# Patient Record
Sex: Male | Born: 1955 | Race: White | Hispanic: No | Marital: Married | Smoking: Former smoker
Health system: Southern US, Community
[De-identification: ages and names within clinical notes are randomized; demographics above are authoritative.]

## PROBLEM LIST (undated history)

## (undated) ENCOUNTER — Inpatient Hospital Stay: Admission: EM | Payer: Self-pay | Source: Home / Self Care

## (undated) DIAGNOSIS — M545 Low back pain, unspecified: Secondary | ICD-10-CM

## (undated) DIAGNOSIS — K7460 Unspecified cirrhosis of liver: Secondary | ICD-10-CM

## (undated) DIAGNOSIS — B182 Chronic viral hepatitis C: Secondary | ICD-10-CM

## (undated) DIAGNOSIS — Z96651 Presence of right artificial knee joint: Secondary | ICD-10-CM

## (undated) DIAGNOSIS — B1920 Unspecified viral hepatitis C without hepatic coma: Secondary | ICD-10-CM

## (undated) DIAGNOSIS — G8929 Other chronic pain: Secondary | ICD-10-CM

## (undated) HISTORY — PX: LAMINECTOMY: SHX219

## (undated) HISTORY — PX: BACK SURGERY: SHX140

## (undated) HISTORY — PX: KNEE ARTHROPLASTY: SHX992

---

## 2012-11-07 ENCOUNTER — Other Ambulatory Visit (HOSPITAL_COMMUNITY): Payer: Self-pay | Admitting: *Deleted

## 2012-11-07 DIAGNOSIS — R188 Other ascites: Secondary | ICD-10-CM

## 2012-11-09 ENCOUNTER — Ambulatory Visit (HOSPITAL_COMMUNITY)
Admission: RE | Admit: 2012-11-09 | Discharge: 2012-11-09 | Disposition: A | Discharge status: Home / Self Care | Payer: Managed Care, Other (non HMO) | Source: Ambulatory Visit | Attending: Orthodontics | Admitting: Orthodontics

## 2012-11-09 VITALS — BP 106/73

## 2012-11-09 DIAGNOSIS — R188 Other ascites: Secondary | ICD-10-CM

## 2012-11-09 MED ORDER — ALBUMIN HUMAN 25 % IV SOLN
25.0000 g | Freq: Once | INTRAVENOUS | Status: AC
  Administered 2012-11-09: 25 g via INTRAVENOUS
  Filled 2012-11-09: qty 100

## 2012-11-09 NOTE — Procedures (Signed)
Procedure : paracentesis up to 7 L limit Specimen : 7 L yellow serous fluid Complications : none immediate Pt sent for albumin infusion post procedure as requested

## 2012-11-21 ENCOUNTER — Other Ambulatory Visit (HOSPITAL_COMMUNITY): Payer: Self-pay | Admitting: Orthodontics

## 2012-11-21 DIAGNOSIS — R188 Other ascites: Secondary | ICD-10-CM

## 2012-11-22 ENCOUNTER — Ambulatory Visit (HOSPITAL_COMMUNITY): Admission: RE | Admit: 2012-11-22 | Payer: Managed Care, Other (non HMO) | Source: Ambulatory Visit

## 2012-11-24 ENCOUNTER — Ambulatory Visit (HOSPITAL_COMMUNITY)
Admission: RE | Admit: 2012-11-24 | Discharge: 2012-11-24 | Disposition: A | Discharge status: Home / Self Care | Payer: Managed Care, Other (non HMO) | Source: Ambulatory Visit | Attending: Orthodontics | Admitting: Orthodontics

## 2012-11-24 VITALS — BP 156/69 | HR 73 | Temp 97.9°F | Resp 20 | Ht 75.0 in | Wt 180.0 lb

## 2012-11-24 DIAGNOSIS — R188 Other ascites: Secondary | ICD-10-CM

## 2012-11-24 MED ORDER — ALBUMIN HUMAN 25 % IV SOLN
25.0000 g | Freq: Once | INTRAVENOUS | Status: AC
  Administered 2012-11-24: 25 g via INTRAVENOUS
  Filled 2012-11-24: qty 100

## 2012-11-24 NOTE — Procedures (Signed)
US guided RLQ para  6.5 Liters yellow fluid 25 gr IV albumin per MD  Pt tolerated well

## 2012-12-01 ENCOUNTER — Other Ambulatory Visit (HOSPITAL_COMMUNITY): Payer: Self-pay | Admitting: Orthodontics

## 2012-12-01 DIAGNOSIS — R188 Other ascites: Secondary | ICD-10-CM

## 2012-12-01 DIAGNOSIS — K7460 Unspecified cirrhosis of liver: Secondary | ICD-10-CM

## 2012-12-05 ENCOUNTER — Ambulatory Visit (HOSPITAL_COMMUNITY)
Admission: RE | Admit: 2012-12-05 | Discharge: 2012-12-05 | Disposition: A | Discharge status: Home / Self Care | Payer: Managed Care, Other (non HMO) | Source: Ambulatory Visit | Attending: Orthodontics | Admitting: Orthodontics

## 2012-12-05 DIAGNOSIS — K7460 Unspecified cirrhosis of liver: Secondary | ICD-10-CM

## 2012-12-05 DIAGNOSIS — R188 Other ascites: Secondary | ICD-10-CM | POA: Insufficient documentation

## 2012-12-05 MED ORDER — ALBUMIN HUMAN 25 % IV SOLN
25.0000 g | Freq: Once | INTRAVENOUS | Status: AC
  Administered 2012-12-05: 25 g via INTRAVENOUS
  Filled 2012-12-05: qty 100

## 2012-12-05 NOTE — Procedures (Signed)
Successful US guided paracentesis from LLQ.  Yielded 6.4L of clear yellow fluid.  No immediate complications.  Pt tolerated well.   Specimen was not sent for labs. Pt was sent to short stay for 25g of IV albumin as ordered.  Brayton El PA-C 12/05/2012 11:27 AM

## 2012-12-15 ENCOUNTER — Ambulatory Visit (HOSPITAL_COMMUNITY)
Admission: RE | Admit: 2012-12-15 | Discharge: 2012-12-15 | Disposition: A | Discharge status: Home / Self Care | Payer: Managed Care, Other (non HMO) | Source: Ambulatory Visit | Attending: Orthodontics | Admitting: Orthodontics

## 2012-12-15 ENCOUNTER — Other Ambulatory Visit (HOSPITAL_COMMUNITY): Payer: Self-pay | Admitting: Orthodontics

## 2012-12-15 DIAGNOSIS — R188 Other ascites: Secondary | ICD-10-CM | POA: Insufficient documentation

## 2012-12-15 MED ORDER — ALBUMIN HUMAN 25 % IV SOLN
25.0000 g | Freq: Once | INTRAVENOUS | Status: AC
  Administered 2012-12-15: 25 g via INTRAVENOUS
  Filled 2012-12-15: qty 100

## 2012-12-15 NOTE — Procedures (Signed)
Successful US guided paracentesis from RLQ.  Yielded 7L of clear yellow fluid.  No immediate complications.  Pt tolerated well.   Specimen was not sent for labs. The patient was sent to Short Stay center for IV albumin as ordered.  Brayton El PA-C 12/15/2012 2:39 PM

## 2012-12-21 ENCOUNTER — Other Ambulatory Visit (HOSPITAL_COMMUNITY): Payer: Self-pay | Admitting: Orthodontics

## 2012-12-21 DIAGNOSIS — R188 Other ascites: Secondary | ICD-10-CM

## 2012-12-22 ENCOUNTER — Ambulatory Visit (HOSPITAL_COMMUNITY)
Admission: RE | Admit: 2012-12-22 | Discharge: 2012-12-22 | Disposition: A | Discharge status: Home / Self Care | Payer: Managed Care, Other (non HMO) | Source: Ambulatory Visit | Attending: Orthodontics | Admitting: Orthodontics

## 2012-12-22 DIAGNOSIS — R188 Other ascites: Secondary | ICD-10-CM | POA: Insufficient documentation

## 2012-12-22 MED ORDER — ALBUMIN HUMAN 25 % IV SOLN
25.0000 g | Freq: Once | INTRAVENOUS | Status: AC
  Administered 2012-12-22: 25 g via INTRAVENOUS

## 2012-12-22 MED ORDER — ALBUMIN HUMAN 25 % IV SOLN: 25.0000 g | Freq: Once | INTRAVENOUS | Status: DC

## 2012-12-22 MED ORDER — ALBUMIN HUMAN 25 % IV SOLN: 40.0000 g | Freq: Once | INTRAVENOUS | Status: DC

## 2012-12-22 NOTE — Procedures (Addendum)
US guided RLQ para 5.4L yellow  25 gr IV albumin per MD

## 2013-01-11 ENCOUNTER — Other Ambulatory Visit (HOSPITAL_COMMUNITY): Payer: Self-pay | Admitting: *Deleted

## 2013-01-11 DIAGNOSIS — R188 Other ascites: Secondary | ICD-10-CM

## 2013-01-11 DIAGNOSIS — K7460 Unspecified cirrhosis of liver: Secondary | ICD-10-CM

## 2013-01-11 NOTE — Addendum Note (Signed)
Encounter addended by: Gatha Mayer on: 01/11/2013  2:28 PM<BR>     Documentation filed: Orders

## 2013-01-11 NOTE — Addendum Note (Signed)
Encounter addended by: Gatha Mayer on: 01/11/2013  3:13 PM<BR>     Documentation filed: Orders

## 2013-01-12 ENCOUNTER — Ambulatory Visit (HOSPITAL_COMMUNITY)
Admission: RE | Admit: 2013-01-12 | Discharge: 2013-01-12 | Disposition: A | Discharge status: Home / Self Care | Payer: Managed Care, Other (non HMO) | Source: Ambulatory Visit | Attending: Family Medicine | Admitting: Family Medicine

## 2013-01-12 VITALS — BP 102/66 | HR 54 | Temp 97.6°F | Resp 18 | Ht 75.0 in | Wt 180.0 lb

## 2013-01-12 DIAGNOSIS — K7460 Unspecified cirrhosis of liver: Secondary | ICD-10-CM | POA: Insufficient documentation

## 2013-01-12 DIAGNOSIS — R188 Other ascites: Secondary | ICD-10-CM

## 2013-01-12 MED ORDER — ALBUMIN HUMAN 25 % IV SOLN
25.0000 g | Freq: Once | INTRAVENOUS | Status: AC
  Administered 2013-01-12: 25 g via INTRAVENOUS
  Filled 2013-01-12: qty 100

## 2013-01-12 NOTE — Procedures (Signed)
US guided therapeutic paracentesis performed yielding 7 liters yellow fluid. No immediate complications. The pt will receive IV albumin postprocedure. 

## 2013-01-23 ENCOUNTER — Inpatient Hospital Stay (HOSPITAL_COMMUNITY)
Admission: EM | Admit: 2013-01-23 | Discharge: 2013-01-30 | DRG: 372 | Disposition: A | Discharge status: Home / Self Care | Payer: Managed Care, Other (non HMO) | Attending: Internal Medicine | Admitting: Internal Medicine

## 2013-01-23 ENCOUNTER — Encounter (HOSPITAL_COMMUNITY): Payer: Self-pay | Admitting: Emergency Medicine

## 2013-01-23 DIAGNOSIS — R5383 Other fatigue: Secondary | ICD-10-CM

## 2013-01-23 DIAGNOSIS — I9589 Other hypotension: Secondary | ICD-10-CM | POA: Diagnosis not present

## 2013-01-23 DIAGNOSIS — Y9210 Unspecified residential institution as the place of occurrence of the external cause: Secondary | ICD-10-CM | POA: Diagnosis not present

## 2013-01-23 DIAGNOSIS — E871 Hypo-osmolality and hyponatremia: Secondary | ICD-10-CM | POA: Diagnosis present

## 2013-01-23 DIAGNOSIS — I959 Hypotension, unspecified: Secondary | ICD-10-CM

## 2013-01-23 DIAGNOSIS — Z7982 Long term (current) use of aspirin: Secondary | ICD-10-CM

## 2013-01-23 DIAGNOSIS — K429 Umbilical hernia without obstruction or gangrene: Secondary | ICD-10-CM | POA: Diagnosis present

## 2013-01-23 DIAGNOSIS — K652 Spontaneous bacterial peritonitis: Secondary | ICD-10-CM

## 2013-01-23 DIAGNOSIS — D638 Anemia in other chronic diseases classified elsewhere: Secondary | ICD-10-CM

## 2013-01-23 DIAGNOSIS — B1920 Unspecified viral hepatitis C without hepatic coma: Secondary | ICD-10-CM | POA: Diagnosis present

## 2013-01-23 DIAGNOSIS — R7989 Other specified abnormal findings of blood chemistry: Secondary | ICD-10-CM | POA: Diagnosis present

## 2013-01-23 DIAGNOSIS — Z87891 Personal history of nicotine dependence: Secondary | ICD-10-CM

## 2013-01-23 DIAGNOSIS — Z7682 Awaiting organ transplant status: Secondary | ICD-10-CM

## 2013-01-23 DIAGNOSIS — R109 Unspecified abdominal pain: Secondary | ICD-10-CM

## 2013-01-23 DIAGNOSIS — D72829 Elevated white blood cell count, unspecified: Secondary | ICD-10-CM | POA: Diagnosis present

## 2013-01-23 DIAGNOSIS — Y849 Medical procedure, unspecified as the cause of abnormal reaction of the patient, or of later complication, without mention of misadventure at the time of the procedure: Secondary | ICD-10-CM | POA: Diagnosis not present

## 2013-01-23 DIAGNOSIS — Z79899 Other long term (current) drug therapy: Secondary | ICD-10-CM

## 2013-01-23 DIAGNOSIS — K7460 Unspecified cirrhosis of liver: Secondary | ICD-10-CM | POA: Diagnosis present

## 2013-01-23 DIAGNOSIS — Z96659 Presence of unspecified artificial knee joint: Secondary | ICD-10-CM

## 2013-01-23 DIAGNOSIS — R188 Other ascites: Secondary | ICD-10-CM

## 2013-01-23 DIAGNOSIS — D6959 Other secondary thrombocytopenia: Secondary | ICD-10-CM

## 2013-01-23 DIAGNOSIS — R531 Weakness: Secondary | ICD-10-CM | POA: Insufficient documentation

## 2013-01-23 DIAGNOSIS — B182 Chronic viral hepatitis C: Secondary | ICD-10-CM

## 2013-01-23 DIAGNOSIS — E875 Hyperkalemia: Secondary | ICD-10-CM | POA: Diagnosis present

## 2013-01-23 HISTORY — DX: Chronic viral hepatitis C: B18.2

## 2013-01-23 HISTORY — DX: Unspecified cirrhosis of liver: K74.60

## 2013-01-23 HISTORY — DX: Presence of right artificial knee joint: Z96.651

## 2013-01-23 HISTORY — DX: Unspecified viral hepatitis C without hepatic coma: B19.20

## 2013-01-23 LAB — BASIC METABOLIC PANEL
BUN: 43 mg/dL — ABNORMAL HIGH (ref 6–23)
CO2: 23 mEq/L (ref 19–32)
Chloride: 100 mEq/L (ref 96–112)
Glucose, Bld: 106 mg/dL — ABNORMAL HIGH (ref 70–99)
Potassium: 4.9 mEq/L (ref 3.5–5.1)
Sodium: 132 mEq/L — ABNORMAL LOW (ref 135–145)

## 2013-01-23 LAB — HEPATIC FUNCTION PANEL
Albumin: 2.8 g/dL — ABNORMAL LOW (ref 3.5–5.2)
Alkaline Phosphatase: 47 U/L (ref 39–117)
Indirect Bilirubin: 0.3 mg/dL (ref 0.3–0.9)
Total Bilirubin: 0.4 mg/dL (ref 0.3–1.2)
Total Protein: 6.8 g/dL (ref 6.0–8.3)

## 2013-01-23 LAB — TROPONIN I: Troponin I: <0.3 ng/mL (ref <0.30)

## 2013-01-23 LAB — URINALYSIS, ROUTINE W REFLEX MICROSCOPIC
Nitrite: NEGATIVE
Protein, ur: NEGATIVE mg/dL
Specific Gravity, Urine: 1.028 (ref 1.005–1.030)
Urobilinogen, UA: 1 mg/dL (ref 0.0–1.0)

## 2013-01-23 LAB — PROTIME-INR
INR: 1.31 (ref 0.00–1.49)
Prothrombin Time: 16 seconds — ABNORMAL HIGH (ref 11.6–15.2)

## 2013-01-23 LAB — CBC WITH DIFFERENTIAL/PLATELET
Eosinophils Absolute: 0.2 10*3/uL (ref 0.0–0.7)
Hemoglobin: 11.4 g/dL — ABNORMAL LOW (ref 13.0–17.0)
Lymphocytes Relative: 15 % (ref 12–46)
Lymphs Abs: 1.9 10*3/uL (ref 0.7–4.0)
MCH: 28.9 pg (ref 26.0–34.0)
Monocytes Relative: 7 % (ref 3–12)
Neutro Abs: 10.2 10*3/uL — ABNORMAL HIGH (ref 1.7–7.7)
Neutrophils Relative %: 77 % (ref 43–77)
RBC: 3.94 MIL/uL — ABNORMAL LOW (ref 4.22–5.81)
WBC: 13.3 10*3/uL — ABNORMAL HIGH (ref 4.0–10.5)

## 2013-01-23 LAB — AMMONIA: Ammonia: 64 umol/L — ABNORMAL HIGH (ref 11–60)

## 2013-01-23 LAB — LACTIC ACID, PLASMA: Lactic Acid, Venous: 2.1 mmol/L (ref 0.5–2.2)

## 2013-01-23 MED ORDER — SODIUM CHLORIDE 0.9 % IV BOLUS (SEPSIS)
1000.0000 mL | Freq: Once | INTRAVENOUS | Status: AC
  Administered 2013-01-23: 1000 mL via INTRAVENOUS

## 2013-01-23 MED ORDER — DEXTROSE 5 % IV SOLN
1.0000 g | Freq: Once | INTRAVENOUS | Status: AC
  Administered 2013-01-23: 1 g via INTRAVENOUS
  Filled 2013-01-23: qty 10

## 2013-01-23 NOTE — ED Provider Notes (Signed)
History     CSN: [id number]  Arrival date & time 01/23/13  1916   None     Chief Complaint  Patient presents with  . Abdominal Pain    (Consider location/radiation/quality/duration/timing/severity/associated sxs/prior treatment) HPI Comments: Patient brought to the ER for evaluation of generalized weakness. Patient is on the transplant list because of hepatitis C. Patient has had progressively worsening weakness over the period of one week. His abdomen has become more distended and he is expressing abdominal discomfort. He feels like he is short of breath because of his increased abdominal distention. He has had multiple paracentesis in the past, is scheduled for another on Friday. He has not had any fever.  Patient is a 57 y.o. male presenting with abdominal pain.  Abdominal Pain Associated symptoms: fatigue and shortness of breath   Associated symptoms: no fever     Past Medical History  Diagnosis Date  . Hepatitis C   . Cirrhosis of liver due to hepatitis C   . Presence of right artificial knee joint     Past Surgical History  Procedure Laterality Date  . Knee arthroplasty      History reviewed. No pertinent family history.  History  Substance Use Topics  . Smoking status: Former Games developer  . Smokeless tobacco: Not on file  . Alcohol Use: No      Review of Systems  Constitutional: Positive for fatigue. Negative for fever.  Respiratory: Positive for shortness of breath.   Gastrointestinal: Positive for abdominal pain and abdominal distention.  Neurological: Positive for weakness.  All other systems reviewed and are negative.    Allergies  Review of patient's allergies indicates no known allergies.  Home Medications  No current outpatient prescriptions on file.  BP 100/74  Pulse 51  Temp(Src) 97.5 F (36.4 C) (Oral)  Resp 12  SpO2 100%  Physical Exam  Constitutional: He is oriented to person, place, and time. He appears listless. He appears  distressed.  HENT:  Head: Normocephalic and atraumatic.  Eyes: Pupils are equal, round, and reactive to light. Scleral icterus is present.  Neck: Normal range of motion. Neck supple.  Cardiovascular: Normal rate, regular rhythm and normal heart sounds.   Pulmonary/Chest: Effort normal and breath sounds normal. No respiratory distress. He has no wheezes. He has no rales.  Abdominal: He exhibits distension. He exhibits no mass. There is tenderness. There is no rebound and no guarding.  Musculoskeletal: Normal range of motion. He exhibits no edema.  Neurological: He is oriented to person, place, and time. He appears listless. No cranial nerve deficit or sensory deficit. GCS eye subscore is 4. GCS verbal subscore is 5. GCS motor subscore is 6.    ED Course  Procedures (including critical care time)   Date: 01/23/2013  Rate: 49  Rhythm: sinus tachycardia and sinus bradycardia  QRS Axis: normal  Intervals: normal  ST/T Wave abnormalities: normal  Conduction Disutrbances:none  Narrative Interpretation:   Old EKG Reviewed: none available    Labs Reviewed  CBC WITH DIFFERENTIAL - Abnormal; Notable for the following:    WBC 13.3 (*)    RBC 3.94 (*)    Hemoglobin 11.4 (*)    HCT 33.5 (*)    Neutro Abs 10.2 (*)    All other components within normal limits  BASIC METABOLIC PANEL - Abnormal; Notable for the following:    Sodium 132 (*)    Glucose, Bld 106 (*)    BUN 43 (*)    GFR calc non  Af Amer 72 (*)    GFR calc Af Amer 83 (*)    All other components within normal limits  PROTIME-INR - Abnormal; Notable for the following:    Prothrombin Time 16.0 (*)    All other components within normal limits  URINALYSIS, ROUTINE W REFLEX MICROSCOPIC - Abnormal; Notable for the following:    Color, Urine AMBER (*)    Ketones, ur 15 (*)    All other components within normal limits  HEPATIC FUNCTION PANEL - Abnormal; Notable for the following:    Albumin 2.8 (*)    All other components within  normal limits  AMMONIA - Abnormal; Notable for the following:    Ammonia 64 (*)    All other components within normal limits  CULTURE, BLOOD (ROUTINE X 2)  CULTURE, BLOOD (ROUTINE X 2)  PRO B NATRIURETIC PEPTIDE  TROPONIN I  LACTIC ACID, PLASMA  TYPE AND SCREEN  ABO/RH   No results found.   Diagnosis: Hypotension; cirrhosis; hep C; possible SBP    MDM  Patient brought to the ER for evaluation of abdominal pain and distention. Patient has history of hepatitis C resulting in liver cirrhosis. He has required recurrent paracentesis. Patient is experiencing progressively worsening abdominal pain over the last week. He is scheduled for paracentesis in 2 days. He has not had a fever. Abdominal exam revealed diffuse tenderness but no peritonitis.  At arrival patient was found to be in distress secondary to hypotension. He was felt like he was going to pass out and it was felt that this was secondary to his low blood pressure. Patient was given a fluid bolus with some improvement, but pressure has dropped once again not a fluid bolus has been stopped.  He does have abdominal pain. Although there is no sign of peritonitis by exam, cannot rule out early spontaneous bacterial peritonitis. Patient treated with Rocephin will be admitted to the hospitalist.        Gilda Crease, MD 01/23/13 [phone]

## 2013-01-23 NOTE — ED Notes (Signed)
Pt c/o of dizziness upon changing positions. Also states abd pain is 6/10.

## 2013-01-23 NOTE — ED Notes (Signed)
Per wife pt has had abd pain and a distended abd. Pt has not been eating, has had low energy. Pt is on the liver transplant list. Pt has been having sx x 1 week.

## 2013-01-24 ENCOUNTER — Encounter (HOSPITAL_COMMUNITY): Payer: Self-pay | Admitting: *Deleted

## 2013-01-24 ENCOUNTER — Inpatient Hospital Stay (HOSPITAL_COMMUNITY): Payer: Managed Care, Other (non HMO)

## 2013-01-24 DIAGNOSIS — R188 Other ascites: Secondary | ICD-10-CM | POA: Diagnosis present

## 2013-01-24 DIAGNOSIS — I9589 Other hypotension: Secondary | ICD-10-CM | POA: Diagnosis present

## 2013-01-24 DIAGNOSIS — D6959 Other secondary thrombocytopenia: Secondary | ICD-10-CM | POA: Diagnosis present

## 2013-01-24 DIAGNOSIS — E875 Hyperkalemia: Secondary | ICD-10-CM | POA: Diagnosis present

## 2013-01-24 LAB — GLUCOSE, CAPILLARY
Glucose-Capillary: 143 mg/dL — ABNORMAL HIGH (ref 70–99)
Glucose-Capillary: 117 mg/dL — ABNORMAL HIGH (ref 70–99)
Glucose-Capillary: 149 mg/dL — ABNORMAL HIGH (ref 70–99)

## 2013-01-24 LAB — COMPREHENSIVE METABOLIC PANEL
ALT: 27 U/L (ref 0–53)
Alkaline Phosphatase: 39 U/L (ref 39–117)
BUN: 57 mg/dL — ABNORMAL HIGH (ref 6–23)
CO2: 22 mEq/L (ref 19–32)
Chloride: 101 mEq/L (ref 96–112)
GFR calc Af Amer: >90 mL/min (ref >90)
GFR calc non Af Amer: 80 mL/min — ABNORMAL LOW (ref >90)
Glucose, Bld: 135 mg/dL — ABNORMAL HIGH (ref 70–99)
Potassium: 5.4 mEq/L — ABNORMAL HIGH (ref 3.5–5.1)
Sodium: 131 mEq/L — ABNORMAL LOW (ref 135–145)
Total Bilirubin: 0.5 mg/dL (ref 0.3–1.2)

## 2013-01-24 LAB — CBC WITH DIFFERENTIAL/PLATELET
Basophils Absolute: 0 10*3/uL (ref 0.0–0.1)
Basophils Relative: 0 % (ref 0–1)
Lymphocytes Relative: 9 % — ABNORMAL LOW (ref 12–46)
MCHC: 35.5 g/dL (ref 30.0–36.0)
Monocytes Absolute: 0.7 10*3/uL (ref 0.1–1.0)
Neutro Abs: 13.8 10*3/uL — ABNORMAL HIGH (ref 1.7–7.7)
Platelets: 121 10*3/uL — ABNORMAL LOW (ref 150–400)
RDW: 15.4 % (ref 11.5–15.5)
WBC: 16 10*3/uL — ABNORMAL HIGH (ref 4.0–10.5)

## 2013-01-24 LAB — LIPASE, BLOOD: Lipase: 35 U/L (ref 11–59)

## 2013-01-24 LAB — LACTIC ACID, PLASMA: Lactic Acid, Venous: 1.9 mmol/L (ref 0.5–2.2)

## 2013-01-24 MED ORDER — RIFAXIMIN 550 MG PO TABS
550.0000 mg | ORAL_TABLET | Freq: Every day | ORAL | Status: DC
  Administered 2013-01-24 – 2013-01-30 (×7): 550 mg via ORAL
  Filled 2013-01-24 (×7): qty 1

## 2013-01-24 MED ORDER — SODIUM CHLORIDE 0.9 % IJ SOLN
3.0000 mL | Freq: Two times a day (BID) | INTRAMUSCULAR | Status: DC
  Administered 2013-01-26 – 2013-01-28 (×5): 3 mL via INTRAVENOUS
  Administered 2013-01-29: 09:00:00 via INTRAVENOUS

## 2013-01-24 MED ORDER — ACETAMINOPHEN 325 MG PO TABS: 650.0000 mg | ORAL_TABLET | Freq: Four times a day (QID) | ORAL | Status: DC | PRN

## 2013-01-24 MED ORDER — ACETAMINOPHEN 650 MG RE SUPP: 650.0000 mg | Freq: Four times a day (QID) | RECTAL | Status: DC | PRN

## 2013-01-24 MED ORDER — HYDROMORPHONE HCL PF 1 MG/ML IJ SOLN
0.5000 mg | INTRAMUSCULAR | Status: DC | PRN
  Administered 2013-01-24: 13:00:00 via INTRAVENOUS
  Administered 2013-01-24: 0.5 mg via INTRAVENOUS
  Administered 2013-01-24: 08:00:00 via INTRAVENOUS
  Administered 2013-01-24: 0.5 mg via INTRAVENOUS
  Administered 2013-01-24: 17:00:00 via INTRAVENOUS
  Administered 2013-01-25: 0.5 mg via INTRAVENOUS
  Administered 2013-01-25: 10:00:00 via INTRAVENOUS
  Administered 2013-01-25: 0.5 mg via INTRAVENOUS
  Administered 2013-01-25: 14:00:00 via INTRAVENOUS
  Administered 2013-01-25: 0.5 mg via INTRAVENOUS
  Administered 2013-01-25: 19:00:00 via INTRAVENOUS
  Administered 2013-01-26 – 2013-01-27 (×8): 0.5 mg via INTRAVENOUS
  Filled 2013-01-24 (×20): qty 1

## 2013-01-24 MED ORDER — ONDANSETRON HCL 4 MG/2ML IJ SOLN
4.0000 mg | Freq: Four times a day (QID) | INTRAMUSCULAR | Status: DC | PRN
  Administered 2013-01-25 – 2013-01-28 (×7): 4 mg via INTRAVENOUS
  Filled 2013-01-24 (×7): qty 2

## 2013-01-24 MED ORDER — SODIUM CHLORIDE 0.9 % IJ SOLN
3.0000 mL | Freq: Two times a day (BID) | INTRAMUSCULAR | Status: DC
  Administered 2013-01-24: 12:00:00 via INTRAVENOUS
  Administered 2013-01-24 – 2013-01-29 (×8): 3 mL via INTRAVENOUS

## 2013-01-24 MED ORDER — ALBUTEROL SULFATE HFA 108 (90 BASE) MCG/ACT IN AERS: 2.0000 | INHALATION_SPRAY | RESPIRATORY_TRACT | Status: DC | PRN

## 2013-01-24 MED ORDER — SERTRALINE HCL 50 MG PO TABS
50.0000 mg | ORAL_TABLET | Freq: Every day | ORAL | Status: DC
  Administered 2013-01-24 – 2013-01-30 (×7): 50 mg via ORAL
  Filled 2013-01-24 (×7): qty 1

## 2013-01-24 MED ORDER — ONDANSETRON HCL 4 MG PO TABS: 4.0000 mg | ORAL_TABLET | Freq: Four times a day (QID) | ORAL | Status: DC | PRN

## 2013-01-24 MED ORDER — IOHEXOL 300 MG/ML  SOLN
20.0000 mL | INTRAMUSCULAR | Status: AC
  Administered 2013-01-24: via ORAL

## 2013-01-24 MED ORDER — ALBUTEROL SULFATE HFA 108 (90 BASE) MCG/ACT IN AERS: 2.0000 | INHALATION_SPRAY | Freq: Four times a day (QID) | RESPIRATORY_TRACT | Status: DC | PRN

## 2013-01-24 MED ORDER — DEXTROSE 5 % IV SOLN
1.0000 g | INTRAVENOUS | Status: AC
  Administered 2013-01-24 – 2013-01-29 (×6): 1 g via INTRAVENOUS
  Filled 2013-01-24 (×6): qty 10

## 2013-01-24 NOTE — Progress Notes (Signed)
[facility] Progress Note [place] TEAM 1 - Stepdown/ICU TEAM   Adrian Compton FAO:[id number] DOB: 1956-06-24 DOA: 01/23/2013 PCP: Delorse Lek, MD  Brief narrative: 57 year old male with known hepatitis C related cirrhosis. On transplant list in [place]. Presented to ER with abdominal pain and weakness. No diarrhea, fever, chills but endorsing arthralgias in both knees and ankles. States was recently treated for bronchitis 2 weeks prior with a Z-Pak. Onset 2-3 days prior in the central abdomen. Noted to be associated with increasing abdominal distention. Underwent paracentesis at this facility on 01/07/2013 with 7 L of fluid removed. In the emergency department the patient was hypotensive with systolic blood pressure in the 80s and he was subsequently given 1 L of normal saline. Pertinent laboratory data include leukocytosis, ammonia level was 64.  Assessment/Plan:  Presumed SBP (spontaneous bacterial peritonitis)/ Abdominal pain -Cont. Empiric anbx's -Cont supportive care -once pursue paracentesis will need to obtain cytology and culture  Cirrhosis of liver due to hepatitis C / Ascites -followed OP by Dr Tonny Bollman in [place]/on transplant list -hepatologist recently doubled diuretic dose -BP has just now stabilized so will momentarily defer paracentesis -repeat ammonia level in am-cont. Xifaxan  Hypotension, iatrogenic -likely due to recent volume depletion after paracentesis further exacerbated by use of Nadolol, Calan and spironolactone at home - these meds remain on HOLD  Dilutional hyponatremia due to cirrhosis -stable and pt alert  Acute hyperkalemia -likely due to resent prerenal azotemia- follow lytes- no Kayexalate for now  Azotemia -due to liver disease and acute prerenal low perfusion/dehydration  Umbilical hernia -soft and easily reduces but remains distended due to increased pressure from ascites  Anemia in chronic illness and Thrombocytopenia  (secondary due to cirrhosis) -baseline hgb unknown- recommend keep > 7.0 -platelets remain > 100,000- follow  DVT prophylaxis: SCDs Code Status: Full Family Communication: Patient daily Disposition Plan: Stepdown  Consultants: None  Procedures: None  Antibiotics: Rocephin 1/18 >>>  HPI/Subjective: Patient awake, endorses generalized malaise and persistent abdominal discomfort although not as severe as prior to admission. Also has discomfort at umbilical hernia site but on exam nontender to palpation and no evidence of incarceration.   Objective: Blood pressure 114/80, pulse 66, temperature 98.1 F (36.7 C), temperature source Oral, resp. rate 11, height 6\' 3"  (1.905 m), weight 81.1 kg (178 lb 12.7 oz), SpO2 99.00%.  Intake/Output Summary (Last 24 hours) at 01/24/13 1107 Last data filed at 01/24/13 0054  Gross per 24 hour  Intake   1000 ml  Output      0 ml  Net   1000 ml    Exam: Followup exam completed  Data Reviewed: Basic Metabolic Panel:  Recent Labs Lab 01/23/13 1953 01/24/13 0500  NA 132* 131*  K 4.9 5.4*  CL 100 101  CO2 23 22  GLUCOSE 106* 135*  BUN 43* 57*  CREATININE 1.12 1.02  CALCIUM 9.3 8.6   Liver Function Tests:  Recent Labs Lab 01/23/13 1953 01/24/13 0500  AST 30 23  ALT 33 27  ALKPHOS 47 39  BILITOT 0.4 0.5  PROT 6.8 6.0  ALBUMIN 2.8* 2.6*    Recent Labs Lab 01/24/13 0500  LIPASE 35    Recent Labs Lab 01/23/13 2104  AMMONIA 64*   CBC:  Recent Labs Lab 01/23/13 1953 01/24/13 0500  WBC 13.3* 16.0*  NEUTROABS 10.2* 13.8*  HGB 11.4* 9.1*  HCT 33.5* 25.6*  MCV 85.0 83.7  PLT 152 121*   Cardiac Enzymes:  Recent Labs Lab 01/23/13 1953  TROPONINI <  0.30   BNP (last 3 results)  Recent Labs  01/23/13 1953  PROBNP 30.1   CBG:  Recent Labs Lab 01/24/13 0639  GLUCAP 143*    Recent Results (from the past 240 hour(s))  MRSA PCR SCREENING     Status: None   Collection Time    01/24/13  2:25 AM       Result Value Range Status   MRSA by PCR NEGATIVE  NEGATIVE Final   Comment:            The GeneXpert MRSA Assay (FDA     approved for NASAL specimens     only), is one component of a     comprehensive MRSA colonization     surveillance program. It is not     intended to diagnose MRSA     infection nor to guide or     monitor treatment for     MRSA infections.     Studies:  Recent x-ray studies have been reviewed in detail by the Attending Physician  Scheduled Meds:  Reviewed in detail by the Attending Physician   Junious Silk, ANP [facility] Office  [phone] Pager [phone]  On-Call/Text Page:      [email or web address]      password [email or web address]  If 7PM-7AM, please contact night-coverage [email or web address] Password [email or web address] 01/24/2013, 11:07 AM   LOS: 1 day   I have personally examined this patient and reviewed the entire database. I have reviewed the above note, made any necessary editorial changes, and agree with its content.  Lonia Blood, MD [facility]

## 2013-01-24 NOTE — Progress Notes (Signed)
INITIAL NUTRITION ASSESSMENT  DOCUMENTATION CODES Per approved criteria  -Severe malnutrition in the context of chronic illness   INTERVENTION: 1.  Modify diet; diet advancement per MD discretion to Heart healthy or Regular with No Added Salt. 2.  Supplements; none ordered at this time, however milkshake can be made with Ensure and ice cream if preferred by pt once POs appropriate.   NUTRITION DIAGNOSIS: Inadequate oral intake related to nausea, abdominal pain as evidenced by pt report, NPO.   Monitor:  1.  Food/Beverage; diet advancement with tolerance 2.  Wt/wt change; deter loss  Reason for Assessment: MST  57 y.o. male  Admitting Dx: SBP (spontaneous bacterial peritonitis)  ASSESSMENT: Pt admitted with abdominal pain and nausea.  Pt with h/o cirrhosis r/t to hepatitis C. He is on a transplant list.  Pt states his usual wt is 230 lbs, and he has lost approximately 50 lbs over the past year.  Pt states he was getting paracentesis with ~7L removed every two weeks.  His MD recently placed him on a sodium and 1.5 L fluid restriction with increased lasix which increased paracentesis frequency from every two weeks to every 3 weeks.  He was due for one this Friday. Pt states that he is generally following low sodium principles, however was told by his physician that kcal/protein were more important than restriction at this time.  Pt endorses severe atrophy related to muscle loss.  He states he was recently placed on a "milk shake diet" and he is currently drinking 1-2 milkshakes per week.  His usual intake is 2 meals per day but he sometimes struggles to eat due to nausea.  He supplements with milkshakes when nausea prevents him from eating 2 meals. Pt denies any additional diet restrictions at this time. Pt endorses appetite at this time and reports hunger.  RD to defer to MD for initiation of PO diet.  Pt aware.  Pt qualifies for severe malnutrition of chronic illness based on degree of wt  loss (23% in ~1 year) and poor PO (pt meeting <75% of needs based on diet recall).  Height: Ht Readings from Last 1 Encounters:  01/24/13 6\' 3"  (1.905 m)    Weight: Wt Readings from Last 1 Encounters:  01/24/13 178 lb 12.7 oz (81.1 kg)    Ideal Body Weight: 89.1 kg  % Ideal Body Weight: 91%  Wt Readings from Last 10 Encounters:  01/24/13 178 lb 12.7 oz (81.1 kg)  01/12/13 180 lb (81.647 kg)  11/24/12 180 lb (81.647 kg)    Usual Body Weight: 230 lbs "last year" per pt  % Usual Body Weight: 77%  BMI:  Body mass index is 22.35 kg/(m^2).  Estimated Nutritional Needs: Kcal: 2200-2450 Protein: 80-95g Fluid: per MD discretion, pt has been on 1.5 L fluid restriction  Skin: intact  Diet Order: NPO  EDUCATION NEEDS: -Education needs addressed   Intake/Output Summary (Last 24 hours) at 01/24/13 1344 Last data filed at 01/24/13 0054  Gross per 24 hour  Intake   1000 ml  Output      0 ml  Net   1000 ml    Last BM: 2/19  Labs:   Recent Labs Lab 01/23/13 1953 01/24/13 0500  NA 132* 131*  K 4.9 5.4*  CL 100 101  CO2 23 22  BUN 43* 57*  CREATININE 1.12 1.02  CALCIUM 9.3 8.6  GLUCOSE 106* 135*    CBG (last 3)   Recent Labs  01/24/13 0639 01/24/13 1234  GLUCAP 143* 117*    Scheduled Meds: . cefTRIAXone (ROCEPHIN)  IV  1 g Intravenous Q24H  . rifaximin  550 mg Oral Daily  . sertraline  50 mg Oral Daily  . sodium chloride  3 mL Intravenous Q12H  . sodium chloride  3 mL Intravenous Q12H    Continuous Infusions:   Past Medical History  Diagnosis Date  . Hepatitis C   . Cirrhosis of liver due to hepatitis C   . Presence of right artificial knee joint   . Cirrhosis of liver     Past Surgical History  Procedure Laterality Date  . Knee arthroplasty    . Back surgery    . Laminectomy      Loyce Dys, MS RD LDN Clinical Inpatient Dietitian Pager: [phone] Weekend/After hours pager: [phone]

## 2013-01-24 NOTE — H&P (Signed)
[facility] History and Physical  Humphrey Guerreiro WUJ:[id number] DOB: Jan 22, 1956 DOA: 01/23/2013  Referring physician: Dr. Senaida Ores PCP: Delorse Lek, MD  Specialists: Gastroenterologist in [place].  Chief Complaint: Abdominal pain and weakness.  HPI: Adrian Compton is a 57 y.o. male with history of cirrhosis of liver and hepatitis C who is on transplant list in [place] presented to the ER because of ongoing abdominal pain with weakness. Patient has been having some nausea but denies any vomiting. Patient's abdominal pain started 2-3 days ago which is mostly in the central abdomen constant. It has been slowly progressing worsening with distention of the abdomen. Patient is scheduled to have paracentesis later this week at [facility]. Patient had last paracentesis last week. Denies any diarrhea has had bowel movement yesterday. Denies any fever chills but does complain of joint pains into both knees and ankle. Was recently treated for bronchitis 2 weeks ago with Z-Pak. Patient in addition was feeling very weak. Patient was found to be hypotensive in the ER and was given a total of 1 L normal saline. At this time patient has been admitted for further management. Patient otherwise denies any chest pain or shortness of breath.  Review of Systems: The patient denies anorexia, fever, weight loss, vision loss, decreased hearing, hoarseness, chest pain, syncope, dyspnea on exertion, peripheral edema, balance deficits, hemoptysis, melena, hematochezia, severe indigestion/heartburn, hematuria, incontinence, genital sores, muscle weakness, suspicious skin lesions, transient blindness, difficulty walking, depression, unusual weight change, abnormal bleeding, enlarged lymph nodes, angioedema, and breast masses. Has abdominal pain with nausea and weakness.  Past Medical History  Diagnosis Date  . Hepatitis C   . Cirrhosis of liver due to hepatitis C   . Presence of right artificial knee joint   .  Cirrhosis of liver    Past Surgical History  Procedure Laterality Date  . Knee arthroplasty    . Back surgery     Social History:  reports that he has quit smoking. He does not have any smokeless tobacco history on file. He reports that he does not drink alcohol or use illicit drugs. Lives at home with his wife. where does patient live--home, ALF, SNF? and with whom if at home? Can do ADLs. Can patient participate in ADLs?  Allergies  Allergen Reactions  . Nuvigil (Armodafinil) Anaphylaxis and Hives    Family History  Problem Relation Age of Onset  . Breast cancer Mother   . Diabetes type II Mother     Prior to Admission medications   Medication Sig Start Date End Date Taking? Authorizing Provider  albuterol (PROVENTIL HFA;VENTOLIN HFA) 108 (90 BASE) MCG/ACT inhaler Inhale 2 puffs into the lungs every 6 (six) hours as needed for wheezing.   Yes Historical Provider, MD  aspirin EC 81 MG tablet Take 81 mg by mouth daily.   Yes Historical Provider, MD  ciprofloxacin (CIPRO) 500 MG tablet Take 500 mg by mouth 2 (two) times daily.   Yes Historical Provider, MD  HYDROcodone-acetaminophen (NORCO) 10-325 MG per tablet Take 1 tablet by mouth every 6 (six) hours as needed for pain.   Yes Historical Provider, MD  nadolol (CORGARD) 40 MG tablet Take 40 mg by mouth 2 (two) times daily.   Yes Historical Provider, MD  omeprazole (PRILOSEC) 20 MG capsule Take 20 mg by mouth daily.   Yes Historical Provider, MD  ondansetron (ZOFRAN-ODT) 4 MG disintegrating tablet Take 4 mg by mouth every 8 (eight) hours as needed for nausea.   Yes Historical Provider, MD  rifaximin (  XIFAXAN) 550 MG TABS Take 550 mg by mouth daily.   Yes Historical Provider, MD  sertraline (ZOLOFT) 50 MG tablet Take 50 mg by mouth daily.   Yes Historical Provider, MD  spironolactone (ALDACTONE) 100 MG tablet Take 100 mg by mouth 2 (two) times daily.   Yes Historical Provider, MD  verapamil (CALAN) 120 MG tablet Take 120 mg by mouth 2  (two) times daily.   Yes Historical Provider, MD   Physical Exam: Filed Vitals:   01/23/13 2145 01/23/13 2200 01/23/13 2215 01/23/13 2230  BP: 98/65 103/70 102/83 86/54  Pulse: 50 51 53 52  Temp:      TempSrc:      Resp: 15 12 11 16   SpO2: 100% 98% 99% 98%     General: Well built and moderately nourished.  Eyes: Anicteric no pallor.  ENT: No discharge from ears eyes nose mouth.  Neck: No mass felt.  Cardiovascular: S1-S2 heard.  Respiratory: No rhonchi no crepitations.  Abdomen: Umbilical hernia looks nonobstructed. Abdomen mildly distended with bowel sounds present no guarding rigidity.  Skin: Pale. No rash.  Musculoskeletal: No swelling in the knee joint or ankle.  Psychiatric: Appears normal.  Neurologic: Moves all extremities.  Labs on Admission:  Basic Metabolic Panel:  Recent Labs Lab 01/23/13 1953  NA 132*  K 4.9  CL 100  CO2 23  GLUCOSE 106*  BUN 43*  CREATININE 1.12  CALCIUM 9.3   Liver Function Tests:  Recent Labs Lab 01/23/13 1953  AST 30  ALT 33  ALKPHOS 47  BILITOT 0.4  PROT 6.8  ALBUMIN 2.8*   No results found for this basename: LIPASE, AMYLASE,  in the last 168 hours  Recent Labs Lab 01/23/13 2104  AMMONIA 64*   CBC:  Recent Labs Lab 01/23/13 1953  WBC 13.3*  NEUTROABS 10.2*  HGB 11.4*  HCT 33.5*  MCV 85.0  PLT 152   Cardiac Enzymes:  Recent Labs Lab 01/23/13 1953  TROPONINI <0.30    BNP (last 3 results)  Recent Labs  01/23/13 1953  PROBNP 30.1   CBG: No results found for this basename: GLUCAP,  in the last 168 hours  Radiological Exams on Admission: No results found.   Assessment/Plan Principal Problem:   Abdominal pain Active Problems:   Weakness   Cirrhosis of liver due to hepatitis C   1. Abdominal pain in a patient with known history of cirrhosis - at this time patient has been empirically started on ceftriaxone for SBP. Do paracentesis in a.m. if hemodynamically stable. CT abdomen and  pelvis without contrast has been ordered. Check lipase. 2. Weakness and hypotension - probably from dehydration. Patient is also mildly tachycardic. EKG shows sinus bradycardia. We will hold patient's beta blocker and calcium channel blocker. Patient has received 1 L normal saline. If patient continues to be hypotensive we will give boluses as required history of continuous infusion. Hold diuretics. 3. Mild anemia - follow CBC.  None. if consultant consulted, please document name and whether formally or informally consulted  Code Status: Full code.  Family Communication: Wife at the bedside.  Disposition Plan: Admit to inpatient.   Lathan Gieselman N. [facility] Pager [phone].  If 7PM-7AM, please contact night-coverage [email or web address] Password [facility] 01/24/2013, 12:04 AM

## 2013-01-24 NOTE — Progress Notes (Signed)
Utilization Review Completed. 01/24/2013  

## 2013-01-24 NOTE — Progress Notes (Signed)
Pt complaining of 5/10 abdominal pain. Refuses tylenol d/t medical condition. BP 102/71 and feels dizzy upon repositioning. Admitting MD notified, orders received for 0.5mg  Dilaudid  Q4HPRN pain.  Will update pt.

## 2013-01-25 ENCOUNTER — Inpatient Hospital Stay (HOSPITAL_COMMUNITY): Payer: Managed Care, Other (non HMO)

## 2013-01-25 LAB — BODY FLUID CELL COUNT WITH DIFFERENTIAL
Lymphs, Fluid: 70 %
Neutrophil Count, Fluid: 2 % (ref 0–25)

## 2013-01-25 LAB — GLUCOSE, CAPILLARY
Glucose-Capillary: 127 mg/dL — ABNORMAL HIGH (ref 70–99)
Glucose-Capillary: 171 mg/dL — ABNORMAL HIGH (ref 70–99)

## 2013-01-25 LAB — COMPREHENSIVE METABOLIC PANEL
ALT: 22 U/L (ref 0–53)
AST: 18 U/L (ref 0–37)
Albumin: 2.7 g/dL — ABNORMAL LOW (ref 3.5–5.2)
Chloride: 97 mEq/L (ref 96–112)
Creatinine, Ser: 0.88 mg/dL (ref 0.50–1.35)
Potassium: 4.7 mEq/L (ref 3.5–5.1)
Sodium: 129 mEq/L — ABNORMAL LOW (ref 135–145)
Total Bilirubin: 0.4 mg/dL (ref 0.3–1.2)

## 2013-01-25 LAB — CBC
MCH: 29.5 pg (ref 26.0–34.0)
MCV: 82.9 fL (ref 78.0–100.0)
Platelets: 105 10*3/uL — ABNORMAL LOW (ref 150–400)
RBC: 2.51 MIL/uL — ABNORMAL LOW (ref 4.22–5.81)

## 2013-01-25 MED ORDER — ALBUMIN HUMAN 25 % IV SOLN
100.0000 g | Freq: Once | INTRAVENOUS | Status: DC
  Filled 2013-01-25 (×2): qty 400

## 2013-01-25 MED ORDER — ALBUMIN HUMAN 25 % IV SOLN
25.0000 g | Freq: Once | INTRAVENOUS | Status: AC
  Administered 2013-01-25: 12.5 g via INTRAVENOUS
  Filled 2013-01-25: qty 100

## 2013-01-25 NOTE — Progress Notes (Signed)
[facility] Progress Note [place] TEAM 1 - Stepdown/ICU TEAM   Lemoyne Nestor JWJ:[id number] DOB: March 22, 1956 DOA: 01/23/2013 PCP: Delorse Lek, MD  Brief narrative: 57 year old male with known hepatitis C related cirrhosis. On transplant list in [place]. Presented to ER with abdominal pain and weakness. No diarrhea, fever, chills but endorsing arthralgias in both knees and ankles. States was recently treated for bronchitis 2 weeks prior with a Z-Pak. Onset 2-3 days prior in the central abdomen. Noted to be associated with increasing abdominal distention. Underwent paracentesis at this facility on 01/07/2013 with 7 L of fluid removed. In the emergency department the patient was hypotensive with systolic blood pressure in the 80s and he was subsequently given 1 L of normal saline. Pertinent laboratory data include leukocytosis, ammonia level was 64.  Assessment/Plan:  Presumed SBP (spontaneous bacterial peritonitis)/ Abdominal pain -Cont. 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 Xifaxan  Hypotension, iatrogenic -likely due to recent volume depletion after paracentesis further exacerbated by use of Nadolol, Calan and spironolactone at home - these meds remain on HOLD -was on Calan pre admit for h/o HTN but suspect will not require at home due to need for higher diuretic dose  Dilutional hyponatremia due to cirrhosis -stable w/ slight trend downward and pt alert  Acute hyperkalemia -resolved -likely due to resent prerenal azotemia- follow lytes- no Kayexalate for now  Azotemia -due to liver disease and acute prerenal low  perfusion/dehydration  Umbilical hernia -soft and easily reduces but remains distended due to increased pressure from ascites  Anemia in chronic illness and 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 rate 10, height 6\' 3"  (1.905 m), weight 85.2 kg (187 lb 13.3 oz), SpO2 100.00%.  Intake/Output Summary (Last 24 hours) at 01/25/13 1045 Last data filed at 01/25/13 0804  Gross per 24 hour  Intake    410 ml  Output    950 ml  Net   -540 ml    Exam: GEN: alert oriented x3,No acute respiratory distress Lungs: Clear to auscultation but diminished in the bases without crackles or wheeze, room air Heart: S1-S2 without rubs murmurs or gallops, minimal JVD, trace peripheral edema Abdomen: Distended and diffusely tender without guarding or rebounding, hypoactive bowel sounds, umbilical hernia which is soft and easily reproducible but due to increased abdominal pressure does not stay reduced, tolerating clear liquid diet Musculoskeletal: Symmetrical without cyanosis or clubbing of bilateral extremities Neurological: Mildly sleepy but easily arousable, oriented x3, weakly moves all extremities x4 without any focal deficits appreciated  Data Reviewed: Basic Metabolic Panel:  Recent Labs Lab 01/23/13 1953 01/24/13 0500 01/25/13 0500  NA 132* 131* 129*  K 4.9 5.4* 4.7  CL 100 101 97  CO2 23 22 22   GLUCOSE 106* 135* 133*  BUN 43* 57* 60*  CREATININE 1.12 1.02 0.88  CALCIUM 9.3 8.6 8.6  Liver Function Tests:  Recent Labs Lab  01/23/13 1953 01/24/13 0500 01/25/13 0500  AST 30 23 18   ALT 33 27 22  ALKPHOS 47 39 34*  BILITOT 0.4 0.5 0.4  PROT 6.8 6.0 6.0  ALBUMIN 2.8* 2.6* 2.7*    Recent Labs Lab 01/24/13 0500  LIPASE 35    Recent Labs Lab 01/23/13 2104 01/25/13 0555  AMMONIA 64* 49   CBC:  Recent Labs Lab 01/23/13 1953 01/24/13 0500 01/25/13 0500  WBC 13.3* 16.0* 17.2*  NEUTROABS 10.2* 13.8*  --   HGB 11.4* 9.1* 7.4*  HCT 33.5* 25.6* 20.8*  MCV 85.0 83.7 82.9  PLT 152 121* 105*   Cardiac Enzymes:  Recent Labs Lab 01/23/13 1953  TROPONINI <0.30   BNP (last 3 results)  Recent Labs  01/23/13 1953  PROBNP 30.1   CBG:  Recent Labs Lab 01/24/13 0639 01/24/13 1234 01/24/13 1828 01/24/13 2230 01/25/13 0802  GLUCAP 143* 117* 149* 137* 127*    Recent Results (from the past 240 hour(s))  CULTURE, BLOOD (ROUTINE X 2)     Status: None   Collection Time    01/23/13  7:45 PM      Result Value Range Status   Specimen Description BLOOD ARM RIGHT   Final   Special Requests BOTTLES DRAWN AEROBIC AND ANAEROBIC 10CC   Final   Culture  Setup Time 01/24/2013 00:22   Final   Culture     Final   Value:        BLOOD CULTURE RECEIVED NO GROWTH TO DATE CULTURE WILL BE HELD FOR 5 DAYS BEFORE ISSUING A FINAL NEGATIVE REPORT   Report Status PENDING   Incomplete  CULTURE, BLOOD (ROUTINE X 2)     Status: None   Collection Time    01/23/13  7:55 PM      Result Value Range Status   Specimen Description BLOOD ARM LEFT   Final   Special Requests     Final   Value: BOTTLES DRAWN AEROBIC AND ANAEROBIC 10CC BLUE 5CCRED   Culture  Setup Time 01/24/2013 00:23   Final   Culture     Final   Value:        BLOOD CULTURE RECEIVED NO GROWTH TO DATE CULTURE WILL BE HELD FOR 5 DAYS BEFORE ISSUING A FINAL NEGATIVE REPORT   Report Status PENDING   Incomplete  MRSA PCR SCREENING     Status: None   Collection Time    01/24/13  2:25 AM      Result Value Range Status   MRSA by PCR NEGATIVE  NEGATIVE Final    Comment:            The GeneXpert MRSA Assay (FDA     approved for NASAL specimens     only), is one component of a     comprehensive MRSA colonization     surveillance program. It is not     intended to diagnose MRSA     infection nor to guide or     monitor treatment for     MRSA infections.     Studies:  Recent x-ray studies have been reviewed in detail by the Attending Physician  Scheduled Meds:  Reviewed in detail by the Attending Physician   Junious Silk, ANP [facility] Office  [phone] Pager [phone]  On-Call/Text Page:      [email or web address]      password [email or web address]  If 7PM-7AM, please contact night-coverage [email or web address] Password [facility] 01/25/2013, 10:45 AM  LOS: 2 days    I have examined the patient, reviewed the chart and modified the above note which I agree with.   Maijor Hornig,MD [phone] 01/25/2013, 2:40 PM

## 2013-01-25 NOTE — Procedures (Signed)
US guided therapeutic paracentesis performed yielding 5.2 liters yellow fluid. No immediates complications.

## 2013-01-26 LAB — PREPARE RBC (CROSSMATCH)

## 2013-01-26 LAB — CBC
HCT: 17.6 % — ABNORMAL LOW (ref 39.0–52.0)
Hemoglobin: 6.2 g/dL — CL (ref 13.0–17.0)
MCH: 29.4 pg (ref 26.0–34.0)
MCHC: 35.2 g/dL (ref 30.0–36.0)
MCV: 83.4 fL (ref 78.0–100.0)

## 2013-01-26 LAB — GLUCOSE, CAPILLARY
Glucose-Capillary: 108 mg/dL — ABNORMAL HIGH (ref 70–99)
Glucose-Capillary: 119 mg/dL — ABNORMAL HIGH (ref 70–99)

## 2013-01-26 LAB — PATHOLOGIST SMEAR REVIEW

## 2013-01-26 LAB — COMPREHENSIVE METABOLIC PANEL
Alkaline Phosphatase: 37 U/L — ABNORMAL LOW (ref 39–117)
BUN: 51 mg/dL — ABNORMAL HIGH (ref 6–23)
Calcium: 9 mg/dL (ref 8.4–10.5)
GFR calc Af Amer: >90 mL/min (ref >90)
Glucose, Bld: 133 mg/dL — ABNORMAL HIGH (ref 70–99)
Total Protein: 6 g/dL (ref 6.0–8.3)

## 2013-01-26 MED ORDER — CALCIUM CARBONATE ANTACID 500 MG PO CHEW
1.0000 | CHEWABLE_TABLET | Freq: Three times a day (TID) | ORAL | Status: DC | PRN
Start: 2013-01-26 — End: 2013-01-30
  Administered 2013-01-26: 200 mg via ORAL
  Filled 2013-01-26: qty 1

## 2013-01-26 MED ORDER — NADOLOL 20 MG PO TABS
20.0000 mg | ORAL_TABLET | Freq: Two times a day (BID) | ORAL | Status: DC
  Administered 2013-01-26 – 2013-01-30 (×8): 20 mg via ORAL
  Filled 2013-01-26 (×10): qty 1

## 2013-01-26 MED ORDER — SPIRONOLACTONE 50 MG PO TABS
50.0000 mg | ORAL_TABLET | Freq: Two times a day (BID) | ORAL | Status: DC
  Administered 2013-01-26 – 2013-01-30 (×7): 50 mg via ORAL
  Filled 2013-01-26 (×10): qty 1

## 2013-01-26 NOTE — Progress Notes (Signed)
CRITICAL VALUE ALERT  Critical value received:  Hemoglobin 6.2  Date of notification:  01/26/2013  Time of notification:  0615  Critical value read back: yes Nurse who received alert:  Birdena Crandall RN  MD notified (1st page): Claiborne Billings  Time of first page:  0618  MD notified (2nd page):  Time of second page:  Responding MD:  Claiborne Billings  Time MD responded:  [phone]

## 2013-01-26 NOTE — Progress Notes (Signed)
4540 Patient arrived to room from 2600 via bed. Placed on telemetry. Call bell within reach.

## 2013-01-26 NOTE — Progress Notes (Signed)
[facility] Progress Note [place] TEAM 1 - Stepdown/ICU TEAM   Adrian Compton [id number]:[id number] DOB: Mar 21, 1956 DOA: 01/23/2013 PCP: Delorse Lek, MD  Brief narrative: 57 year old male with known hepatitis C related cirrhosis. On transplant list in [place]. Presented to ER with abdominal pain and weakness. No diarrhea, fever, chills but endorsing arthralgias in both knees and ankles. States was recently treated for bronchitis 2 weeks prior with a Z-Pak. Onset 2-3 days prior in the central abdomen. Noted to be associated with increasing abdominal distention. Underwent paracentesis at this facility on 01/07/2013 with 7 L of fluid removed. In the emergency department the patient was hypotensive with systolic blood pressure in the 80s and he was subsequently given 1 L of normal saline. Pertinent laboratory data include leukocytosis, ammonia level was 64.  Since the patient's admission he is being covered empirically for spontaneous bacterial peritonitis.  He was gently volume resuscitated and paracentesis was delayed due to persistent hypotension.  With volume resuscitation the patient's blood pressure improved and he was able to undergo the paracentesis.  Paracentesis studies have not been suggestive of SBP but were drawn after antibiotics had been dosed.  With volume expansion a significant anemia became appreciable and the patient has received a total of 2 units of packed red blood cells thus far.  Assessment/Plan:  Presumed SBP (spontaneous bacterial peritonitis)/ Abdominal pain -Cont. 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  Xifaxan  Hypotension, iatrogenic -likely due to recent volume depletion after paracentesis further exacerbated by use of Nadolol, Calan and spironolactone at home as well as anemia -With transfusion blood pressure has become more stable -Will slowly begin to resume home medicines and watch blood pressure closely  Dilutional hyponatremia due to cirrhosis -stable w/ slight trend downward and pt alert  Acute hyperkalemia -resolved -likely due to transient prerenal azotemia  Umbilical hernia -soft and easily reduces but remains distended due to increased pressure from ascites  Anemia in chronic illness and 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  States he feels much better today.  No abdominal pain.  Has an appetite and wishes to eat.  Denies shortness of breath or chest pain.    Objective: Blood pressure 130/80, pulse 93, temperature 99 F (37.2 C), temperature source Oral, resp. rate 8, height 6\' 3"  (1.905 m), weight 77.5 kg (170 lb 13.7 oz), SpO2 99.00%.  Intake/Output Summary (Last 24 hours) at 01/26/13 1747 Last data filed at 01/26/13 1213  Gross per 24 hour  Intake   1236 ml  Output    700 ml  Net    536 ml    Exam: GEN: alert oriented x3 - no acute respiratory distress Lungs: Clear to auscultation but diminished in the bases without crackles or wheeze, room air Heart: Regular rate and rhythm without  murmur gallop or rub Abdomen: mildly distended and diffusely tender without guarding or rebounding, hypoactive bowel sounds, umbilical  hernia which is soft and easily reproducible Musculoskeletal: Symmetrical without cyanosis or clubbing of bilateral extremities Neurological: oriented x3, weakly moves all extremities x4 without any focal deficits appreciated  Data Reviewed: Basic Metabolic Panel:  Recent Labs Lab 01/23/13 1953 01/24/13 0500 01/25/13 0500 01/26/13 0525  NA 132* 131* 129* 127*  K 4.9 5.4* 4.7 4.6  CL 100 101 97 95*  CO2 23 22 22 22   GLUCOSE 106* 135* 133* 133*  BUN 43* 57* 60* 51*  CREATININE 1.12 1.02 0.88 0.84  CALCIUM 9.3 8.6 8.6 9.0   Liver Function Tests:  Recent Labs Lab 01/23/13 1953 01/24/13 0500 01/25/13 0500 01/26/13 0525  AST 30 23 18 18   ALT 33 27 22 20   ALKPHOS 47 39 34* 37*  BILITOT 0.4 0.5 0.4 0.4  PROT 6.8 6.0 6.0 6.0  ALBUMIN 2.8* 2.6* 2.7* 2.9*    Recent Labs Lab 01/24/13 0500  LIPASE 35    Recent Labs Lab 01/23/13 2104 01/25/13 0555  AMMONIA 64* 49   CBC:  Recent Labs Lab 01/23/13 1953 01/24/13 0500 01/25/13 0500 01/26/13 0525  WBC 13.3* 16.0* 17.2* 15.8*  NEUTROABS 10.2* 13.8*  --   --   HGB 11.4* 9.1* 7.4* 6.2*  HCT 33.5* 25.6* 20.8* 17.6*  MCV 85.0 83.7 82.9 83.4  PLT 152 121* 105* 92*   Cardiac Enzymes:  Recent Labs Lab 01/23/13 1953  TROPONINI <0.30   BNP (last 3 results)  Recent Labs  01/23/13 1953  PROBNP 30.1   CBG:  Recent Labs Lab 01/25/13 1723 01/25/13 2144 01/26/13 0746 01/26/13 1211 01/26/13 1643  GLUCAP 171* 144* 125* 108* 97    Recent Results (from the past 240 hour(s))  CULTURE, BLOOD (ROUTINE X 2)     Status: None   Collection Time    01/23/13  7:45 PM      Result Value Range Status   Specimen Description BLOOD ARM RIGHT   Final   Special Requests BOTTLES DRAWN AEROBIC AND ANAEROBIC 10CC   Final   Culture  Setup Time 01/24/2013 00:22   Final   Culture     Final   Value:        BLOOD CULTURE RECEIVED NO GROWTH TO DATE CULTURE WILL BE HELD FOR 5 DAYS BEFORE ISSUING A FINAL NEGATIVE REPORT   Report  Status PENDING   Incomplete  CULTURE, BLOOD (ROUTINE X 2)     Status: None   Collection Time    01/23/13  7:55 PM      Result Value Range Status   Specimen Description BLOOD ARM LEFT   Final   Special Requests     Final   Value: BOTTLES DRAWN AEROBIC AND ANAEROBIC 10CC BLUE 5CCRED   Culture  Setup Time 01/24/2013 00:23   Final   Culture     Final   Value:        BLOOD CULTURE RECEIVED NO GROWTH TO DATE CULTURE WILL BE HELD FOR 5 DAYS BEFORE ISSUING A FINAL NEGATIVE REPORT   Report Status PENDING   Incomplete  MRSA PCR SCREENING     Status: None   Collection Time    01/24/13  2:25 AM      Result Value Range Status   MRSA by PCR NEGATIVE  NEGATIVE Final   Comment:            The GeneXpert MRSA Assay (FDA  approved for NASAL specimens     only), is one component of a     comprehensive MRSA colonization     surveillance program. It is not     intended to diagnose MRSA     infection nor to guide or     monitor treatment for     MRSA infections.  BODY FLUID CULTURE     Status: None   Collection Time    01/25/13 10:46 AM      Result Value Range Status   Specimen Description ASCITIC ABDOMEN FLUID   Final   Special Requests FLUID   Final   Gram Stain     Final   Value: NO WBC SEEN     NO ORGANISMS SEEN   Culture NO GROWTH 1 DAY   Final   Report Status PENDING   Incomplete     Studies:  Recent x-ray studies have been reviewed in detail by the Attending Physician  Scheduled Meds:  Reviewed in detail by the Attending Physician  Lonia Blood, MD [facility] Office  [phone] Pager [phone]  On-Call/Text Page:      [email or web address]      password [email or web address]  If 7PM-7AM, please contact night-coverage [email or web address] Password [facility] 01/26/2013, 5:47 PM   LOS: 3 days

## 2013-01-27 LAB — TYPE AND SCREEN
ABO/RH(D): A POS
Antibody Screen: NEGATIVE

## 2013-01-27 LAB — COMPREHENSIVE METABOLIC PANEL
Albumin: 2.9 g/dL — ABNORMAL LOW (ref 3.5–5.2)
BUN: 34 mg/dL — ABNORMAL HIGH (ref 6–23)
Calcium: 8.9 mg/dL (ref 8.4–10.5)
GFR calc Af Amer: >90 mL/min (ref >90)
Glucose, Bld: 107 mg/dL — ABNORMAL HIGH (ref 70–99)
Total Protein: 5.9 g/dL — ABNORMAL LOW (ref 6.0–8.3)

## 2013-01-27 LAB — CBC
HCT: 19.4 % — ABNORMAL LOW (ref 39.0–52.0)
Hemoglobin: 6.9 g/dL — CL (ref 13.0–17.0)
RBC: 2.28 MIL/uL — ABNORMAL LOW (ref 4.22–5.81)
WBC: 13.2 10*3/uL — ABNORMAL HIGH (ref 4.0–10.5)

## 2013-01-27 LAB — GLUCOSE, CAPILLARY
Glucose-Capillary: 122 mg/dL — ABNORMAL HIGH (ref 70–99)
Glucose-Capillary: 108 mg/dL — ABNORMAL HIGH (ref 70–99)
Glucose-Capillary: 95 mg/dL (ref 70–99)

## 2013-01-27 MED ORDER — FUROSEMIDE 20 MG PO TABS
20.0000 mg | ORAL_TABLET | Freq: Every day | ORAL | Status: DC
  Administered 2013-01-28 – 2013-01-30 (×3): 20 mg via ORAL
  Filled 2013-01-27 (×4): qty 1

## 2013-01-27 MED ORDER — HYDROMORPHONE HCL PF 1 MG/ML IJ SOLN
1.0000 mg | INTRAMUSCULAR | Status: DC | PRN
  Administered 2013-01-27 – 2013-01-28 (×5): 1 mg via INTRAVENOUS
  Filled 2013-01-27 (×5): qty 1

## 2013-01-27 NOTE — Plan of Care (Signed)
Problem: Phase II Progression Outcomes Goal: Pain controlled Outcome: Completed/Met Date Met:  01/27/13 Dilaudid upped to 1mg  q4h

## 2013-01-27 NOTE — Progress Notes (Signed)
[facility] Progress Note   Adrian Compton AVW:[id number] DOB: [birth year] DOA: 01/23/2013 PCP: Delorse Lek, MD   HPI/Subjective: He feels much better, he still have some abdominal pain.  Brief narrative: 57 year old male with known hepatitis C related cirrhosis. On transplant list in [place]. Presented to ER with abdominal pain and weakness. No diarrhea, fever, chills but endorsing arthralgias in both knees and ankles. States was recently treated for bronchitis 2 weeks prior with a Z-Pak. Onset 2-3 days prior in the central abdomen. Noted to be associated with increasing abdominal distention. Underwent paracentesis at this facility on 01/07/2013 with 7 L of fluid removed. In the emergency department the patient was hypotensive with systolic blood pressure in the 80s and he was subsequently given 1 L of normal saline. Pertinent laboratory data include leukocytosis, ammonia level was 64.  Since the patient's admission he is being covered empirically for spontaneous bacterial peritonitis.  He was gently volume resuscitated and paracentesis was delayed due to persistent hypotension.  With volume resuscitation the patient's blood pressure improved and he was able to undergo the paracentesis.  Paracentesis studies have not been suggestive of SBP but were drawn after antibiotics had been dosed.  With volume expansion a significant anemia became appreciable and the patient has received a total of 2 units of packed red blood cells thus far, cannot rule out occult bleeding, check FOBT.  Assessment/Plan:  Presumed SBP (spontaneous bacterial peritonitis)/ Abdominal pain -Cont. 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 Xifaxan -Will restart his fall diuretic dose because of ascites reaccumulation  Hypotension, iatrogenic -likely due to recent volume depletion after paracentesis further exacerbated by use of Nadolol, Calan and spironolactone at home as well as anemia -With transfusion blood pressure has become more stable -Will slowly begin to resume home medicines and watch blood pressure closely  Hyponatremia -Chronic hyponatremia secondary to liver cirrhosis.  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 -Patient hemoglobin is still 6.9, I will transfuse 2 units of packed RBCs. Has suboptimal response with 1 unit of RBC. -Patient has history of bleeding varices, check fecal occult blood. -platelets remain relatively stable  DVT prophylaxis: SCDs Code Status: Full Family Communication: Patient  Disposition Plan: Transfer to telemetry bed   Consultants: None  Procedures: 01/25/2013 - paracentesis - removal of 5.2 L  Antibiotics: Rocephin 1/18 >>>   Objective: Blood pressure 107/71, pulse 76, temperature 98.3 F (36.8 C), temperature source Oral, resp. rate 16, height 6\' 3"  (1.905 m), weight 77.2 kg (170 lb 3.1 oz), SpO2 100.00%.  Intake/Output Summary (Last 24 hours) at 01/27/13 1148 Last data filed at 01/27/13 0500  Gross per 24 hour  Intake    360 ml  Output    300 ml  Net     60 ml    Exam: GEN: alert oriented x3 - no acute respiratory distress Lungs: Clear to auscultation but diminished in the bases without crackles or wheeze,  room air Heart: Regular rate and rhythm without murmur gallop or rub Abdomen: mildly distended  and diffusely tender without guarding or rebounding, hypoactive bowel sounds, umbilical hernia which is soft and easily reproducible Musculoskeletal: Symmetrical without cyanosis or clubbing of bilateral extremities Neurological: oriented x3, weakly moves all extremities x4 without any focal deficits appreciated  Data Reviewed: Basic Metabolic Panel:  Recent Labs Lab 01/23/13 1953 01/24/13 0500 01/25/13 0500 01/26/13 0525 01/27/13 0450  NA 132* 131* 129* 127* 126*  K 4.9 5.4* 4.7 4.6 5.0  CL 100 101 97 95* 95*  CO2 23 22 22 22 24   GLUCOSE 106* 135* 133* 133* 107*  BUN 43* 57* 60* 51* 34*  CREATININE 1.12 1.02 0.88 0.84 0.89  CALCIUM 9.3 8.6 8.6 9.0 8.9   Liver Function Tests:  Recent Labs Lab 01/23/13 1953 01/24/13 0500 01/25/13 0500 01/26/13 0525 01/27/13 0450  AST 30 23 18 18 26   ALT 33 27 22 20 24   ALKPHOS 47 39 34* 37* 67  BILITOT 0.4 0.5 0.4 0.4 0.4  PROT 6.8 6.0 6.0 6.0 5.9*  ALBUMIN 2.8* 2.6* 2.7* 2.9* 2.9*    Recent Labs Lab 01/24/13 0500  LIPASE 35    Recent Labs Lab 01/23/13 2104 01/25/13 0555  AMMONIA 64* 49   CBC:  Recent Labs Lab 01/23/13 1953 01/24/13 0500 01/25/13 0500 01/26/13 0525 01/27/13 0450  WBC 13.3* 16.0* 17.2* 15.8* 13.2*  NEUTROABS 10.2* 13.8*  --   --   --   HGB 11.4* 9.1* 7.4* 6.2* 6.9*  HCT 33.5* 25.6* 20.8* 17.6* 19.4*  MCV 85.0 83.7 82.9 83.4 85.1  PLT 152 121* 105* 92* 96*   Cardiac Enzymes:  Recent Labs Lab 01/23/13 1953  TROPONINI <0.30   BNP (last 3 results)  Recent Labs  01/23/13 1953  PROBNP 30.1   CBG:  Recent Labs Lab 01/26/13 0746 01/26/13 1211 01/26/13 1643 01/26/13 2159 01/27/13 0741  GLUCAP 125* 108* 97 119* 122*    Recent Results (from the past 240 hour(s))  CULTURE, BLOOD (ROUTINE X 2)     Status: None   Collection Time    01/23/13  7:45 PM      Result Value Range Status   Specimen Description BLOOD ARM RIGHT   Final   Special Requests BOTTLES DRAWN AEROBIC AND ANAEROBIC  10CC   Final   Culture  Setup Time 01/24/2013 00:22   Final   Culture     Final   Value:        BLOOD CULTURE RECEIVED NO GROWTH TO DATE CULTURE WILL BE HELD FOR 5 DAYS BEFORE ISSUING A FINAL NEGATIVE REPORT   Report Status PENDING   Incomplete  CULTURE, BLOOD (ROUTINE X 2)     Status: None   Collection Time    01/23/13  7:55 PM      Result Value Range Status   Specimen Description BLOOD ARM LEFT   Final   Special Requests     Final   Value: BOTTLES DRAWN AEROBIC AND ANAEROBIC 10CC BLUE 5CCRED   Culture  Setup Time 01/24/2013 00:23   Final   Culture     Final   Value:        BLOOD CULTURE RECEIVED NO GROWTH TO DATE CULTURE WILL BE HELD FOR 5 DAYS BEFORE ISSUING A FINAL NEGATIVE REPORT   Report Status PENDING   Incomplete  MRSA PCR SCREENING     Status: None   Collection Time    01/24/13  2:25 AM  Result Value Range Status   MRSA by PCR NEGATIVE  NEGATIVE Final   Comment:            The GeneXpert MRSA Assay (FDA     approved for NASAL specimens     only), is one component of a     comprehensive MRSA colonization     surveillance program. It is not     intended to diagnose MRSA     infection nor to guide or     monitor treatment for     MRSA infections.  BODY FLUID CULTURE     Status: None   Collection Time    01/25/13 10:46 AM      Result Value Range Status   Specimen Description ASCITIC ABDOMEN FLUID   Final   Special Requests FLUID   Final   Gram Stain     Final   Value: NO WBC SEEN     NO ORGANISMS SEEN   Culture NO GROWTH 1 DAY   Final   Report Status PENDING   Incomplete     Studies:  Recent x-ray studies have been reviewed in detail by the Attending Physician  Scheduled Meds:  Reviewed in detail by the Attending Physician  Lonia Blood, MD [facility] Office  [phone] Pager [phone]  On-Call/Text Page:      [email or web address]      password [email or web address]  If 7PM-7AM, please contact night-coverage [email or web address] Password [facility] 01/27/2013,  11:48 AM   LOS: 4 days

## 2013-01-28 LAB — TYPE AND SCREEN
ABO/RH(D): A POS
Antibody Screen: NEGATIVE
Unit division: 0

## 2013-01-28 LAB — BASIC METABOLIC PANEL
BUN: 24 mg/dL — ABNORMAL HIGH (ref 6–23)
CO2: 27 mEq/L (ref 19–32)
Calcium: 8.4 mg/dL (ref 8.4–10.5)
GFR calc non Af Amer: >90 mL/min (ref >90)
Glucose, Bld: 98 mg/dL (ref 70–99)

## 2013-01-28 LAB — CBC
Hemoglobin: 8.3 g/dL — ABNORMAL LOW (ref 13.0–17.0)
MCH: 30 pg (ref 26.0–34.0)
MCHC: 36.1 g/dL — ABNORMAL HIGH (ref 30.0–36.0)
MCV: 83 fL (ref 78.0–100.0)
Platelets: 85 10*3/uL — ABNORMAL LOW (ref 150–400)

## 2013-01-28 LAB — GLUCOSE, CAPILLARY
Glucose-Capillary: 123 mg/dL — ABNORMAL HIGH (ref 70–99)
Glucose-Capillary: 111 mg/dL — ABNORMAL HIGH (ref 70–99)

## 2013-01-28 MED ORDER — OXYCODONE HCL 5 MG PO TABS
5.0000 mg | ORAL_TABLET | Freq: Four times a day (QID) | ORAL | Status: DC | PRN
  Administered 2013-01-28 – 2013-01-29 (×3): 5 mg via ORAL
  Filled 2013-01-28 (×3): qty 1

## 2013-01-28 MED ORDER — SODIUM POLYSTYRENE SULFONATE 15 GM/60ML PO SUSP
30.0000 g | Freq: Once | ORAL | Status: AC
  Administered 2013-01-28: 30 g via ORAL
  Filled 2013-01-28: qty 120

## 2013-01-28 MED ORDER — POLYETHYLENE GLYCOL 3350 17 G PO PACK
17.0000 g | PACK | Freq: Every day | ORAL | Status: DC | PRN
  Filled 2013-01-28: qty 1

## 2013-01-28 NOTE — Progress Notes (Signed)
[facility] Progress Note   Adrian Compton ZOX:[id number] DOB: [birth year] DOA: 01/23/2013 PCP: Delorse Lek, MD   HPI/Subjective: Interviewed while he was eating his breakfast, denies abdominal pain. Did not have bowel movement since yesterday.  Brief narrative: 57 year old male with known hepatitis C related cirrhosis. On transplant list in [place]. Presented to ER with abdominal pain and weakness. No diarrhea, fever, chills but endorsing arthralgias in both knees and ankles. States was recently treated for bronchitis 2 weeks prior with a Z-Pak. Onset 2-3 days prior in the central abdomen. Noted to be associated with increasing abdominal distention. Underwent paracentesis at this facility on 01/07/2013 with 7 L of fluid removed. In the emergency department the patient was hypotensive with systolic blood pressure in the 80s and he was subsequently given 1 L of normal saline. Pertinent laboratory data include leukocytosis, ammonia level was 64.  Since the patient's admission he is being covered empirically for spontaneous bacterial peritonitis.  He was gently volume resuscitated and paracentesis was delayed due to persistent hypotension.  With volume resuscitation the patient's blood pressure improved and he was able to undergo the paracentesis.  Paracentesis studies have not been suggestive of SBP but were drawn after antibiotics had been dosed.  With volume expansion a significant anemia became appreciable and the patient has received a total of 2 units of packed red blood cells thus far, cannot rule out occult bleeding, check FOBT.  Assessment/Plan:  Presumed SBP (spontaneous bacterial peritonitis)/ Abdominal pain -Cont. Empiric anbx's -Cont supportive care -Paracentesis studies not convincing for SBP -We will complete empiric antibiotic course   Cirrhosis of liver due to hepatitis C / Ascites -followed OP by Dr Tonny Bollman in [place]/on transplant list -hepatologist recently  doubled diuretic dose - will resume at half dose with no plans to go back to higher former dose -repeat ammonia level 69 >>> 49 -cont. Xifaxan -Diuretics restart, he'll be on Lasix 20 mg and Aldactone 50 mg daily  Hypotension, iatrogenic -likely due to recent volume depletion after paracentesis further exacerbated by use of Nadolol, Calan and spironolactone at home as well as anemia -With transfusion blood pressure has become more stable -Will slowly begin to resume home medicines and watch blood pressure closely  Hyponatremia -Chronic hyponatremia secondary to liver cirrhosis.  Acute hyperkalemia -resolved. 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 -Patient hemoglobin is still 6.9, I will transfuse 2 units of packed RBCs. Has suboptimal response with 1 unit of RBC. -Patient has history of bleeding varices, check fecal occult blood. -platelets remain relatively stable  DVT prophylaxis: SCDs Code Status: Full Family Communication: Patient  Disposition Plan: Transfer to telemetry bed   Consultants: None  Procedures: 01/25/2013 - paracentesis - removal of 5.2 L  Antibiotics: Rocephin 1/18 >>>   Objective: Blood pressure 108/73, pulse 72, temperature 98 F (36.7 C), temperature source Oral, resp. rate 20, height 6\' 3"  (1.905 m), weight 77.3 kg (170 lb 6.7 oz), SpO2 98.00%.  Intake/Output Summary (Last 24 hours) at 01/28/13 1115 Last data filed at 01/28/13 0520  Gross per 24 hour  Intake   1090 ml  Output    600 ml  Net    490 ml    Exam: GEN: alert oriented x3 - no acute respiratory distress Lungs: Clear to auscultation but  diminished in the bases without crackles or wheeze, room air Heart: Regular rate and  rhythm without murmur gallop or rub Abdomen: mildly distended and diffusely tender without guarding or rebounding, hypoactive bowel sounds, umbilical hernia which is soft and easily reproducible Musculoskeletal: Symmetrical without cyanosis or clubbing of bilateral extremities Neurological: oriented x3, weakly moves all extremities x4 without any focal deficits appreciated  Data Reviewed: Basic Metabolic Panel:  Recent Labs Lab 01/24/13 0500 01/25/13 0500 01/26/13 0525 01/27/13 0450 01/28/13 0600  NA 131* 129* 127* 126* 126*  K 5.4* 4.7 4.6 5.0 5.0  CL 101 97 95* 95* 94*  CO2 22 22 22 24 27   GLUCOSE 135* 133* 133* 107* 98  BUN 57* 60* 51* 34* 24*  CREATININE 1.02 0.88 0.84 0.89 0.85  CALCIUM 8.6 8.6 9.0 8.9 8.4   Liver Function Tests:  Recent Labs Lab 01/23/13 1953 01/24/13 0500 01/25/13 0500 01/26/13 0525 01/27/13 0450  AST 30 23 18 18 26   ALT 33 27 22 20 24   ALKPHOS 47 39 34* 37* 67  BILITOT 0.4 0.5 0.4 0.4 0.4  PROT 6.8 6.0 6.0 6.0 5.9*  ALBUMIN 2.8* 2.6* 2.7* 2.9* 2.9*    Recent Labs Lab 01/24/13 0500  LIPASE 35    Recent Labs Lab 01/23/13 2104 01/25/13 0555  AMMONIA 64* 49   CBC:  Recent Labs Lab 01/23/13 1953 01/24/13 0500 01/25/13 0500 01/26/13 0525 01/27/13 0450 01/28/13 0600  WBC 13.3* 16.0* 17.2* 15.8* 13.2* 10.0  NEUTROABS 10.2* 13.8*  --   --   --   --   HGB 11.4* 9.1* 7.4* 6.2* 6.9* 8.3*  HCT 33.5* 25.6* 20.8* 17.6* 19.4* 23.0*  MCV 85.0 83.7 82.9 83.4 85.1 83.0  PLT 152 121* 105* 92* 96* 85*   Cardiac Enzymes:  Recent Labs Lab 01/23/13 1953  TROPONINI <0.30   BNP (last 3 results)  Recent Labs  01/23/13 1953  PROBNP 30.1   CBG:  Recent Labs Lab 01/27/13 0741 01/27/13 1225 01/27/13 1730 01/27/13 2126 01/28/13 0755  GLUCAP 122* 108* 95 133* 96    Recent Results (from the past 240 hour(s))  CULTURE, BLOOD (ROUTINE X 2)     Status: None   Collection Time    01/23/13  7:45 PM      Result Value Range Status    Specimen Description BLOOD ARM RIGHT   Final   Special Requests BOTTLES DRAWN AEROBIC AND ANAEROBIC 10CC   Final   Culture  Setup Time 01/24/2013 00:22   Final   Culture     Final   Value:        BLOOD CULTURE RECEIVED NO GROWTH TO DATE CULTURE WILL BE HELD FOR 5 DAYS BEFORE ISSUING A FINAL NEGATIVE REPORT   Report Status PENDING   Incomplete  CULTURE, BLOOD (ROUTINE X 2)     Status: None   Collection Time    01/23/13  7:55 PM      Result Value Range Status   Specimen Description BLOOD ARM LEFT   Final   Special Requests     Final   Value: BOTTLES DRAWN AEROBIC AND ANAEROBIC 10CC BLUE 5CCRED   Culture  Setup Time 01/24/2013 00:23   Final   Culture     Final   Value:        BLOOD CULTURE RECEIVED NO GROWTH TO DATE CULTURE WILL BE HELD FOR 5 DAYS BEFORE ISSUING A FINAL NEGATIVE REPORT   Report Status PENDING   Incomplete  MRSA PCR SCREENING  Status: None   Collection Time    01/24/13  2:25 AM      Result Value Range Status   MRSA by PCR NEGATIVE  NEGATIVE Final   Comment:            The GeneXpert MRSA Assay (FDA     approved for NASAL specimens     only), is one component of a     comprehensive MRSA colonization     surveillance program. It is not     intended to diagnose MRSA     infection nor to guide or     monitor treatment for     MRSA infections.  BODY FLUID CULTURE     Status: None   Collection Time    01/25/13 10:46 AM      Result Value Range Status   Specimen Description ASCITIC ABDOMEN FLUID   Final   Special Requests FLUID   Final   Gram Stain     Final   Value: NO WBC SEEN     NO ORGANISMS SEEN   Culture NO GROWTH 2 DAYS   Final   Report Status PENDING   Incomplete     Studies:  Recent x-ray studies have been reviewed in detail by the Attending Physician  Scheduled Meds:  Reviewed in detail by the Attending Physician  Lonia Blood, MD [facility] Office  [phone] Pager [phone]  On-Call/Text Page:      [email or web address]       password [email or web address]  If 7PM-7AM, please contact night-coverage [email or web address] Password [facility] [place] 01/28/2013, 11:15 AM   LOS: 5 days

## 2013-01-29 LAB — CBC
Hemoglobin: 8.3 g/dL — ABNORMAL LOW (ref 13.0–17.0)
MCH: 30.1 pg (ref 26.0–34.0)
MCHC: 35.3 g/dL (ref 30.0–36.0)
Platelets: 79 10*3/uL — ABNORMAL LOW (ref 150–400)
RDW: 17.1 % — ABNORMAL HIGH (ref 11.5–15.5)

## 2013-01-29 LAB — COMPREHENSIVE METABOLIC PANEL
ALT: 20 U/L (ref 0–53)
AST: 19 U/L (ref 0–37)
Alkaline Phosphatase: 63 U/L (ref 39–117)
CO2: 28 mEq/L (ref 19–32)
Calcium: 8.1 mg/dL — ABNORMAL LOW (ref 8.4–10.5)
Chloride: 95 mEq/L — ABNORMAL LOW (ref 96–112)
GFR calc Af Amer: >90 mL/min (ref >90)
GFR calc non Af Amer: >90 mL/min (ref >90)
Glucose, Bld: 88 mg/dL (ref 70–99)
Sodium: 129 mEq/L — ABNORMAL LOW (ref 135–145)
Total Bilirubin: 0.4 mg/dL (ref 0.3–1.2)

## 2013-01-29 LAB — GLUCOSE, CAPILLARY

## 2013-01-29 NOTE — Progress Notes (Signed)
Addendum  Patient seen and examined, chart and data base reviewed.  I agree with the above assessment and plan.  For full details please see Mrs. Algis Downs PA note.  Presumed SBP, hepatic cirrhosis. Now improving and approaching his baseline.  For probable discharge in the morning, likely to go to SNF.   Clint Lipps, MD [facility] Pager: [phone] 01/29/2013, 4:29 PM  \

## 2013-01-29 NOTE — Evaluation (Signed)
Physical Therapy Evaluation Patient Details Name: Adrian Compton MRN: [id number] DOB: 08-31-1956 Today's Date: 01/29/2013 Time: 4098-1191 PT Time Calculation (min): 15 min  PT Assessment / Plan / Recommendation Clinical Impression  Patient is a 57 y/o male admitted with bacterial peritonitis with h/o cirrhosis and new dx of hypotension.  He presents with overall decreased endurance, but demonstrates all mobility tasks independently without devices and tests at low risk for falls on balance tests.  Feel he would be worn out going to outpatient PT so would recommend HHPT for at least home safety eval and home exercise program.  No further skilled PT needs in acute setting.    PT Assessment  All further PT needs can be met in the next venue of care    Follow Up Recommendations  Home health PT          Equipment Recommendations  None recommended by PT          Precautions / Restrictions Precautions Precautions: None   Pertinent Vitals/Pain Denies pain      Mobility  Bed Mobility Supine to Sit: 7: Independent Transfers Sit to Stand: 6: Modified independent (Device/Increase time) Stand to Sit: 6: Modified independent (Device/Increase time) Ambulation/Gait Ambulation/Gait Assistance: 7: Independent Ambulation Distance (Feet): 150 Feet Assistive device: None Gait Pattern: Step-through pattern General Gait Details: slow pace        PT Diagnosis: Generalized weakness  PT Problem List: Decreased activity tolerance;Decreased strength PT Treatment Interventions:     PT Goals    Visit Information  Last PT Received On: 01/29/13 Assistance Needed: +1    Subjective Data  Subjective: I might be trying to do too much. Patient Stated Goal: To return to independent   Prior Functioning  Home Living Lives With: Spouse Available Help at Discharge: Available 24 hours/day;Other (Comment) Type of Home: House Home Access: Level entry Home Layout: One level Bathroom  Shower/Tub: Tub/shower unit;Curtain Firefighter: Standard Home Adaptive Equipment: Sock aid Prior Function Level of Independence: Needs assistance Needs Assistance: Bathing;Dressing;Meal Prep;Toileting Bath: Supervision/set-up Dressing: Minimal Meal Prep: Maximal Driving: Yes Vocation: On disability Communication Communication: No difficulties    Cognition  Cognition Overall Cognitive Status: Appears within functional limits for tasks assessed/performed Arousal/Alertness: Awake/alert Orientation Level: Appears intact for tasks assessed Behavior During Session: [facility] for tasks performed    Extremity/Trunk Assessment Right Lower Extremity Assessment RLE ROM/Strength/Tone: WFL for tasks assessed RLE Sensation: WFL - Light Touch Left Lower Extremity Assessment LLE ROM/Strength/Tone: WFL for tasks assessed LLE Sensation: WFL - Light Touch Trunk Assessment Trunk Assessment: Kyphotic   Balance Standardized Balance Assessment Standardized Balance Assessment: Dynamic Gait Index Berg Balance Test Sit to Stand: Able to stand  independently using hands Stand to Sit: Controls descent by using hands Standing Unsupported with Eyes Closed: Able to stand 10 seconds safely From Standing, Reach Forward with Outstretched Arm: Can reach confidently >25 cm (10") From Standing Position, Turn to Look Behind Over each Shoulder: Looks behind from both sides and weight shifts well Turn 360 Degrees: Able to turn 360 degrees safely but slowly Standing on One Leg: Able to lift leg independently and hold > 10 seconds Dynamic Gait Index Level Surface: Mild Impairment Change in Gait Speed: Mild Impairment Gait with Horizontal Head Turns: Normal Gait with Vertical Head Turns: Normal Step Over Obstacle: Normal Step Around Obstacles: Normal  End of Session PT - End of Session Equipment Utilized During Treatment: Gait belt Activity Tolerance: Patient tolerated treatment well Patient left: in bed  GP  WYNN,CYNDI 01/29/2013, 4:58 PM  Sheran Lawless, PT [phone] 01/29/2013

## 2013-01-29 NOTE — Care Management Note (Signed)
    Page 1 of 2   01/30/2013     10:14:25 AM   CARE MANAGEMENT NOTE 01/30/2013  Patient:  Adrian Compton, Adrian Compton   Account Number:  [id number]  Date Initiated:  01/29/2013  Documentation initiated by:  Letha Cape  Subjective/Objective Assessment:   dx bacterial  admit- lives with wife.     Action/Plan:   CIR screen- not a CIR candidate   Anticipated DC Date:  01/30/2013   Anticipated DC Plan:  HOME W HOME HEALTH SERVICES      DC Planning Services  CM consult      [facility] Choice  HOME HEALTH   Choice offered to / List presented to:  C-3 Spouse        HH arranged  HH-2 PT  HH-6 SOCIAL WORKER  HH - 11 Patient Refused      Status of service:  Completed, signed off Medicare Important Message given?   (If response is "NO", the following Medicare IM given date fields will be blank) Date Medicare IM given:   Date Additional Medicare IM given:    Discharge Disposition:  HOME/SELF CARE  Per UR Regulation:  Reviewed for med. necessity/level of care/duration of stay  If discussed at Long Length of Stay Meetings, dates discussed:    Comments:  01/30/13 10:08 Letha Cape RN, BSN [phone] patient was told he needs hhpt for home safety eval and CSW, he states he does not need this and refuses to accept the [facility].  Patient is for dc today, his wife will be transporting him home.  Patient has medication coverage. No other needs anticipated.   01/29/13 14:48 Letha Cape RN, BSN [phone] patient lives with spouse, pt refusing snf, CIR liason will see if pt is a CIR candidate.

## 2013-01-29 NOTE — Progress Notes (Signed)
[facility] Progress Note   Ranon Coven YQM:[id number] DOB: 1956/06/14 DOA: 01/23/2013 PCP: Delorse Lek, MD   HPI/Subjective: Patient and his wife are concerned that he is not ready for discharge today. Wife is unable to handle him at home because she is moving. He is still unsteady on his feet.  Brief narrative: 57 year old male with known hepatitis C related cirrhosis. On transplant list in [place]. Presented to ER with abdominal pain and weakness. No diarrhea, fever, chills but endorsing arthralgias in both knees and ankles. States was recently treated for bronchitis 2 weeks prior with a Z-Pak. Onset 2-3 days prior in the central abdomen. Noted to be associated with increasing abdominal distention. Underwent paracentesis at this facility on 01/07/2013 with 7 L of fluid removed. In the emergency department the patient was hypotensive with systolic blood pressure in the 80s and he was subsequently given 1 L of normal saline. Pertinent laboratory data include leukocytosis, ammonia level was 64.  Since the patient's admission he is being covered empirically for spontaneous bacterial peritonitis.  He was gently volume resuscitated and paracentesis was delayed due to persistent hypotension.  With volume resuscitation the patient's blood pressure improved and he was able to undergo the paracentesis.  Paracentesis studies have not been suggestive of SBP but were drawn after antibiotics had been dosed.  With volume expansion a significant anemia became appreciable and the patient has received a total of 2 units of packed red blood cells thus far, cannot rule out occult bleeding, check FOBT.  Assessment/Plan:  Presumed SBP (spontaneous bacterial peritonitis)/ Abdominal pain -Finish Empiric anbx treatment with Rocephin, 1 g daily, today 01/29/2013. -Paracentesis studies not convincing for SBP, weight blood cells not elevated, no PMNs.  Cirrhosis of liver due to hepatitis C /  Ascites -followed OP by Dr Tonny Bollman in [place]/on transplant list -hepatologist recently doubled diuretic dose - will resume at half dose with no plans to go back to higher former dose -repeat ammonia level 69 >>> 49 -cont. Xifaxan. Patient refuses lactulose -Diuretics restart, he'll be on Lasix 20 mg and Aldactone 50 mg daily  Hypotension, iatrogenic -Lethargy resolved. -likely due to recent volume depletion after paracentesis further exacerbated by use of Nadolol, Calan and spironolactone at home as well as anemia -With transfusion blood pressure has become more stable -Will slowly begin to resume home medicines and watch blood pressure closely  Hyponatremia -Chronic hyponatremia secondary to liver cirrhosis.  Acute hyperkalemia -resolved. 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 -Patient hemoglobin is 6.9,  transfused 2 units of packed RBCs.  -Patient has history of bleeding varices, check fecal occult blood. -platelets remain relatively stable  DVT prophylaxis: SCDs Code Status: Full Family Communication: Patient, wife at bedside Disposition Plan: PT evaluation for rehabilitation. Patient unsteady on his feet, orthostatic, falling. Wife unable to care for him at home while she is moving.   Consultants: None  Procedures: 01/25/2013 - paracentesis - removal of 5.2 L  2 units of packed RBCs transfused without adverse effects.   Antibiotics: Rocephin 1/18 >>> will discontinue after today's dose as he will have completed 7 days of therapy   Objective: Blood pressure 101/68, pulse 66, temperature 98 F (36.7 C), temperature source Oral, resp. rate 18, height 6\' 3"  (1.905 m), weight 80 kg (176  lb 5.9 oz), SpO2  98.00%.  Intake/Output Summary (Last 24 hours) at 01/29/13 1343 Last data filed at 01/29/13 1300  Gross per 24 hour  Intake    873 ml  Output    750 ml  Net    123 ml    Exam: GEN:  pale, thin, alert oriented x3 - no acute respiratory distress Lungs: Clear to auscultation but diminished in the bases without crackles or wheeze, room air Heart: Regular rate and rhythm without murmur gallop or rub Abdomen: mildly distended and diffusely tender without guarding or rebounding, hypoactive bowel sounds, umbilical hernia which is soft and easily reproducible, no scars noted.  Musculoskeletal: Symmetrical without cyanosis or clubbing of bilateral extremities Neurological: oriented x3, weakly moves all extremities x4 without any focal deficits appreciated  Data Reviewed: Basic Metabolic Panel:  Recent Labs Lab 01/25/13 0500 01/26/13 0525 01/27/13 0450 01/28/13 0600 01/29/13 0649  NA 129* 127* 126* 126* 129*  K 4.7 4.6 5.0 5.0 3.8  CL 97 95* 95* 94* 95*  CO2 22 22 24 27 28   GLUCOSE 133* 133* 107* 98 88  BUN 60* 51* 34* 24* 18  CREATININE 0.88 0.84 0.89 0.85 0.94  CALCIUM 8.6 9.0 8.9 8.4 8.1*   Liver Function Tests:  Recent Labs Lab 01/24/13 0500 01/25/13 0500 01/26/13 0525 01/27/13 0450 01/29/13 0649  AST 23 18 18 26 19   ALT 27 22 20 24 20   ALKPHOS 39 34* 37* 67 63  BILITOT 0.5 0.4 0.4 0.4 0.4  PROT 6.0 6.0 6.0 5.9* 5.6*  ALBUMIN 2.6* 2.7* 2.9* 2.9* 2.6*    Recent Labs Lab 01/24/13 0500  LIPASE 35    Recent Labs Lab 01/23/13 2104 01/25/13 0555  AMMONIA 64* 49   CBC:  Recent Labs Lab 01/23/13 1953 01/24/13 0500 01/25/13 0500 01/26/13 0525 01/27/13 0450 01/28/13 0600 01/29/13 0649  WBC 13.3* 16.0* 17.2* 15.8* 13.2* 10.0 8.3  NEUTROABS 10.2* 13.8*  --   --   --   --   --   HGB 11.4* 9.1* 7.4* 6.2* 6.9* 8.3* 8.3*  HCT 33.5* 25.6* 20.8* 17.6* 19.4* 23.0* 23.5*  MCV 85.0 83.7 82.9 83.4 85.1 83.0 85.1  PLT 152 121* 105* 92* 96* 85* 79*   Cardiac  Enzymes:  Recent Labs Lab 01/23/13 1953  TROPONINI <0.30   BNP (last 3 results)  Recent Labs  01/23/13 1953  PROBNP 30.1   CBG:  Recent Labs Lab 01/28/13 1218 01/28/13 1701 01/28/13 2141 01/29/13 0741 01/29/13 1143  GLUCAP 107* 123* 111* 88 119*    Recent Results (from the past 240 hour(s))  CULTURE, BLOOD (ROUTINE X 2)     Status: None   Collection Time    01/23/13  7:45 PM      Result Value Range Status   Specimen Description BLOOD ARM RIGHT   Final   Special Requests BOTTLES DRAWN AEROBIC AND ANAEROBIC 10CC   Final   Culture  Setup Time 01/24/2013 00:22   Final   Culture     Final   Value:        BLOOD CULTURE RECEIVED NO GROWTH TO DATE CULTURE WILL BE HELD FOR 5 DAYS BEFORE ISSUING A FINAL NEGATIVE REPORT   Report Status PENDING   Incomplete  CULTURE, BLOOD (ROUTINE X 2)     Status: None   Collection Time    01/23/13  7:55 PM      Result Value Range Status   Specimen Description BLOOD ARM LEFT   Final  Special Requests     Final   Value: BOTTLES DRAWN AEROBIC AND ANAEROBIC 10CC BLUE 5CCRED   Culture  Setup Time 01/24/2013 00:23   Final   Culture     Final   Value:        BLOOD CULTURE RECEIVED NO GROWTH TO DATE CULTURE WILL BE HELD FOR 5 DAYS BEFORE ISSUING A FINAL NEGATIVE REPORT   Report Status PENDING   Incomplete  MRSA PCR SCREENING     Status: None   Collection Time    01/24/13  2:25 AM      Result Value Range Status   MRSA by PCR NEGATIVE  NEGATIVE Final   Comment:            The GeneXpert MRSA Assay (FDA     approved for NASAL specimens     only), is one component of a     comprehensive MRSA colonization     surveillance program. It is not     intended to diagnose MRSA     infection nor to guide or     monitor treatment for     MRSA infections.  BODY FLUID CULTURE     Status: None   Collection Time    01/25/13 10:46 AM      Result Value Range Status   Specimen Description ASCITIC ABDOMEN FLUID   Final   Special Requests FLUID    Final   Gram Stain     Final   Value: NO WBC SEEN     NO ORGANISMS SEEN   Culture NO GROWTH 3 DAYS   Final   Report Status 01/28/2013 FINAL   Final     Studies:  Recent x-ray studies have been reviewed in detail by the Attending Physician  Scheduled Meds:  Current facility-administered medications:acetaminophen (TYLENOL) suppository 650 mg, 650 mg, Rectal, Q6H PRN, Eduard Clos, MD;  acetaminophen (TYLENOL) tablet 650 mg, 650 mg, Oral, Q6H PRN, Eduard Clos, MD;  albuterol (PROVENTIL HFA;VENTOLIN HFA) 108 (90 BASE) MCG/ACT inhaler 2 puff, 2 puff, Inhalation, Q4H PRN, Lonia Blood, MD calcium carbonate (TUMS - dosed in mg elemental calcium) chewable tablet 200 mg of elemental calcium, 1 tablet, Oral, TID BM PRN, Lonia Blood, MD, 200 mg of elemental calcium at 01/26/13 1718;  cefTRIAXone (ROCEPHIN) 1 g in dextrose 5 % 50 mL IVPB, 1 g, Intravenous, Q24H, Dejon Lukas L York, PA, 1 g at 01/28/13 2220;  furosemide (LASIX) tablet 20 mg, 20 mg, Oral, Daily, Clydia Llano, MD, 20 mg at 01/29/13 0917 HYDROmorphone (DILAUDID) injection 1 mg, 1 mg, Intravenous, Q4H PRN, Clydia Llano, MD, 1 mg at 01/28/13 1304;  nadolol (CORGARD) tablet 20 mg, 20 mg, Oral, BID, Lonia Blood, MD, 20 mg at 01/29/13 0917;  ondansetron (ZOFRAN) injection 4 mg, 4 mg, Intravenous, Q6H PRN, Eduard Clos, MD, 4 mg at 01/28/13 0510;  ondansetron (ZOFRAN) tablet 4 mg, 4 mg, Oral, Q6H PRN, Eduard Clos, MD oxyCODONE (Oxy IR/ROXICODONE) immediate release tablet 5 mg, 5 mg, Oral, Q6H PRN, Clydia Llano, MD, 5 mg at 01/29/13 0925;  polyethylene glycol (MIRALAX / GLYCOLAX) packet 17 g, 17 g, Oral, Daily PRN, Clydia Llano, MD;  rifaximin (XIFAXAN) tablet 550 mg, 550 mg, Oral, Daily, Eduard Clos, MD, 550 mg at 01/29/13 0919;  sertraline (ZOLOFT) tablet 50 mg, 50 mg, Oral, Daily, Eduard Clos, MD, 50 mg at 01/29/13 0916 sodium chloride 0.9 % injection 3 mL, 3 mL, Intravenous, Q12H, Arshad N  Toniann Fail, MD;  sodium chloride 0.9 % injection 3 mL, 3 mL, Intravenous, Q12H, Eduard Clos, MD, 3 mL at 01/29/13 0920;  spironolactone (ALDACTONE) tablet 50 mg, 50 mg, Oral, BID, Lonia Blood, MD, 50 mg at 01/29/13 8119  Algis Downs, [place] [facility] Pager: [phone]   On-Call/Text Page:      [email or web address]      password [email or web address]  If 7PM-7AM, please contact night-coverage [email or web address] Password [facility] 01/29/2013, 1:43 PM   LOS: 6 days

## 2013-01-29 NOTE — Evaluation (Signed)
Occupational Therapy Evaluation Patient Details Name: Adrian Compton MRN: [id number] DOB: Apr 06, 1956 Today's Date: 01/29/2013 Time: 1308-6578 OT Time Calculation (min): 33 min  OT Assessment / Plan / Recommendation Clinical Impression  Pt is a 57 yr old man admitted bacterial peritonitis and history of cirrhosis as well as new hypotension. Overall presents at a supervision level to modified independent level for ADLs.  Demonstrates limitations in endurance and slight dynamic balance but overall feel he does not need any acute care OT.  Encouraged him to use a seat for safety in the shower and to do as much as he can for himself instead of letting his wife do it.     OT Assessment  Patient does not need any further OT services    Follow Up Recommendations  No OT follow up    Barriers to Discharge None    Equipment Recommendations  None recommended by OT          Precautions / Restrictions Precautions Precautions: None Restrictions Weight Bearing Restrictions: No   Pertinent Vitals/Pain BP in standing 101/70 O2 sats 100% on room air    ADL  Eating/Feeding: Simulated;Independent Where Assessed - Eating/Feeding: Edge of bed Grooming: Simulated;Modified independent Where Assessed - Grooming: Unsupported standing Upper Body Bathing: Simulated;Modified independent Where Assessed - Upper Body Bathing: Unsupported sitting Lower Body Bathing: Simulated;Modified independent Where Assessed - Lower Body Bathing: Unsupported sit to stand Upper Body Dressing: Simulated;Modified independent Where Assessed - Upper Body Dressing: Unsupported sitting Lower Body Dressing: Simulated;Modified independent Where Assessed - Lower Body Dressing: Unsupported sit to stand Toilet Transfer: Performed;Supervision/safety Toilet Transfer Method: Other (comment) (ambulate without assistive device) Toilet Transfer Equipment: Regular height toilet;Grab bars Toileting - Clothing Manipulation and Hygiene:  Simulated;Modified independent Where Assessed - Toileting Clothing Manipulation and Hygiene: Sit to stand from 3-in-1 or toilet Tub/Shower Transfer: Simulated;Supervision/safety Tub/Shower Transfer Method: Ambulating Equipment Used: Gait belt Transfers/Ambulation Related to ADLs: Pt overall supervision for mobility without use of assistive device. ADL Comments: Pt supervision to modified independent level for simulated selfcare tasks and functional transfers.  Recommended pt to purchase a small seat for the shower to help with energy conservation and safety.  Otherwise feel he has no further acute OT needs.        Visit Information  Last OT Received On: 01/29/13 Assistance Needed: +1    Subjective Data  Subjective: I've been having my wife help me some at home. Patient Stated Goal: To get stronger.   Prior Functioning     Home Living Lives With: Spouse Available Help at Discharge: Available 24 hours/day;Other (Comment) (wife works from home, almost able to provide 24 hour) Type of Home: House Home Access: Level entry Home Layout: One level Bathroom Shower/Tub: Forensic scientist: Standard Home Adaptive Equipment: Sock aid Prior Function Level of Independence: Needs assistance Needs Assistance: Bathing;Dressing;Meal Prep;Toileting Bath: Supervision/set-up Dressing: Minimal Meal Prep: Maximal Driving: Yes Vocation: On disability Communication Communication: No difficulties Dominant Hand: Right         Vision/Perception Vision - History Baseline Vision: No visual deficits Patient Visual Report: No change from baseline Vision - Assessment Eye Alignment: Within Functional Limits Vision Assessment: Vision not tested Perception Perception: Within Functional Limits Praxis Praxis: Intact   Cognition  Cognition Overall Cognitive Status: Appears within functional limits for tasks assessed/performed Arousal/Alertness: Awake/alert Orientation  Level: Appears intact for tasks assessed Behavior During Session: [facility] for tasks performed    Extremity/Trunk Assessment Right Upper Extremity Assessment RUE ROM/Strength/Tone: [facility] for tasks assessed RUE Sensation:  WFL - Light Touch RUE Coordination: WFL - gross/fine motor Left Upper Extremity Assessment LUE ROM/Strength/Tone: WFL for tasks assessed LUE Sensation: WFL - Light Touch LUE Coordination: WFL - gross/fine motor Trunk Assessment Trunk Assessment: Normal     Mobility Bed Mobility Bed Mobility: Supine to Sit Supine to Sit: 7: Independent Transfers Transfers: Sit to Stand;Stand to Sit Sit to Stand: 6: Modified independent (Device/Increase time);With upper extremity assist;From bed Stand to Sit: 6: Modified independent (Device/Increase time);Without upper extremity assist;To bed        Balance Balance Balance Assessed: Yes Dynamic Standing Balance Dynamic Standing - Balance Support: No upper extremity supported Dynamic Standing - Level of Assistance: 5: Stand by assistance   End of Session OT - End of Session Equipment Utilized During Treatment: Gait belt Patient left: in bed;with call bell/phone within reach Nurse Communication: Mobility status     Sunya Humbarger,Awesome OTR/L Pager number [phone] 01/29/2013, 12:30 PM

## 2013-01-29 NOTE — Progress Notes (Signed)
Rehab Admissions Coordinator Note:  Patient was screened by Trish Mage for appropriateness for an Inpatient Acute Rehab Consult.  At this time, we are recommending HH or may not need any follow up.  Did very well with OT today and therefore would not meet criteria for an acute inpatient rehab admission.    Trish Mage 01/29/2013, 2:59 PM  I can be reached at [phone].

## 2013-01-30 LAB — CULTURE, BLOOD (ROUTINE X 2): Culture: NO GROWTH

## 2013-01-30 MED ORDER — FUROSEMIDE 40 MG PO TABS: 40.0000 mg | ORAL_TABLET | Freq: Every day | ORAL | Status: DC

## 2013-01-30 NOTE — Discharge Summary (Signed)
Physician Discharge Summary  Adrian Compton ZOX:[id number] DOB: [birth year] DOA: 01/23/2013  PCP: Adrian Lek, MD  Admit date: 01/23/2013 Discharge date: 01/30/2013  Time spent: 40 minutes   Recommendations for Outpatient Follow-up:  1. Followup with primary care physician  Discharge Diagnoses:  Principal Problem:   Presumed SBP (spontaneous bacterial peritonitis) Active Problems:   Abdominal pain   Cirrhosis of liver due to hepatitis C   Ascites   Hypotension, iatrogenic   Leukocytosis   Dilutional hyponatremia due to cirrhosis   Umbilical hernia   Acute hyperkalemia   Azotemia   Anemia in chronic illness   Thrombocytopenia, secondary due to cirrhosis   Discharge Condition:  Stable  Diet recommendation:  Low-sodium diet  Filed Weights   01/28/13 0500 01/29/13 0456 01/30/13 0515  Weight: 77.3 kg (170 lb 6.7 oz) 80 kg (176 lb 5.9 oz) 79.4 kg (175 lb 0.7 oz)    History of present illness:  Adrian Compton is a 57 y.o. male with history of cirrhosis of liver and hepatitis C who is on transplant list in [place] presented to the ER because of ongoing abdominal pain with weakness. Patient has been having some nausea but denies any vomiting. Patient's abdominal pain started 2-3 days ago which is mostly in the central abdomen constant. It has been slowly progressing worsening with distention of the abdomen. Patient is scheduled to have paracentesis later this week at [facility]. Patient had last paracentesis last week. Denies any diarrhea has had bowel movement yesterday. Denies any fever chills but does complain of joint pains into both knees and ankle. Was recently treated for bronchitis 2 weeks ago with Z-Pak. Patient in addition was feeling very weak. Patient was found to be hypotensive in the ER and was given a total of 1 L normal saline. At this time patient has been admitted for further management. Patient otherwise denies any chest pain or shortness of breath. Since the  patient's admission he is being covered empirically for spontaneous bacterial peritonitis. He was gently volume resuscitated and paracentesis was delayed due to persistent hypotension. With volume resuscitation the patient's blood pressure improved and he was able to undergo the paracentesis. Paracentesis studies have not been suggestive of SBP but were drawn after antibiotics had been dosed. With volume expansion a significant anemia became appreciable and the patient has received a total of 2 units of packed red blood cells thus far, cannot rule out occult bleeding, check FOBT.  Hospital Course:   1. Presumed SBP: As mentioned above patient after initial evaluation in the emergency department 7 L of fluids were removed, patient developed hypotension, and he met SIRS criteria. The hypotension was presumed to be secondary to SBP, it might be also secondary to volume depletion the patient did have some leukocytosis. Admitted to the hospital started empirically on Rocephin daily. Another paracentesis was done for diagnostic purposes and they said it was not convincing for SBP only total WBC of 105. Patient received antibiotic for total of 7 days, on the day of discharge I felt antibiotics are not necessary.  2. Hypotension: It is likely secondary to recent volume depletion after paracentesis, this is exacerbated further by use of nadolol, Calan and spironolactone. As mentioned above patient was empirically treated for SBP with antibiotics for total of 7 days.  3. Hepatic cirrhosis: Secondary to chronic hepatitis C, patient follows with Dr. Nolon Compton in [place], he is on a transplant list. Patient is on Xifaxan, he refused lactulose. Patient does have grade 3 ascites and  he seems like his overall functional status is deteriorating. Patient lost a lot of weight recently, has a lot of muscle wasting as well as his not having the same level of energy over time. On the time of discharge patient discharged on  Aldactone 100 mg and Lasix 40 mg. Calan was discontinued because after the addition of the Lasix blood pressure was marginal.  4. Anemia: Patient has anemia of chronic disease likely secondary to his hepatic cirrhosis/hypersplenism. Hemoglobin hit a nadir of 6.9, a total transfusion of 3 units of packed RBCs was done during this hospital stay. His hemoglobin remained stable after the transfusion. FOBT was ordered but patient had constipation and was not able to provide specimen.  5. Acute hyperkalemia: This is likely secondary to growth volume depletion and prerenal azotemia plus the unopposed use of the Aldactone. Lasix added. And the hypoglycemia resolved.  6. Thrombocytopenia: Patient has multiple findings related to his chronic liver disease including hypoalbuminemia, thrombocytopenia, ascites, muscle wasting, chronic anemia, hyponatremia. He does have preserved INR of 1.3, his creatinine is 0.9. Overall his functional status is declining, and he might be considered to be moved higher in the transplant list, we will leave that to his primary hepatologist.   Procedures:  Paracentesis with removal of 7 L of fluid done on 01/12/2013 by IR.  Paracentesis with removal of 5.2 L of fluid done on 01/25/2013 by IR  Consultations:   None  Discharge Exam: Filed Vitals:   01/29/13 1324 01/29/13 2250 01/30/13 0515 01/30/13 0908  BP: 101/68 108/75 105/62 102/68  Pulse: 66 64 59 65  Temp: 98 F (36.7 C) 96.6 F (35.9 C) 98.2 F (36.8 C)   TempSrc: Oral Oral Oral   Resp: 18 16 16    Height:      Weight:   79.4 kg (175 lb 0.7 oz)   SpO2: 98% 98% 99%    General: Alert and awake, oriented x3, not in any acute distress. HEENT: anicteric sclera, pupils reactive to light and accommodation, EOMI CVS: S1-S2 clear, no murmur rubs or gallops Chest: clear to auscultation bilaterally, no wheezing, rales or rhonchi Abdomen: soft nontender, moderate distention, infected umbilicus Extremities: no  cyanosis, clubbing or edema noted bilaterally Neuro: Cranial nerves II-XII intact, no focal neurological deficits  Discharge Instructions  Discharge Orders   Future Orders Complete By Expires     Diet - low sodium heart healthy  As directed     Increase activity slowly  As directed         Medication List    STOP taking these medications       ciprofloxacin 500 MG tablet  Commonly known as:  CIPRO     verapamil 120 MG tablet  Commonly known as:  CALAN      TAKE these medications       albuterol 108 (90 BASE) MCG/ACT inhaler  Commonly known as:  PROVENTIL HFA;VENTOLIN HFA  Inhale 2 puffs into the lungs every 6 (six) hours as needed for wheezing.     aspirin EC 81 MG tablet  Take 81 mg by mouth daily.     furosemide 40 MG tablet  Commonly known as:  LASIX  Take 1 tablet (40 mg total) by mouth daily.     HYDROcodone-acetaminophen 10-325 MG per tablet  Commonly known as:  NORCO  Take 1 tablet by mouth every 6 (six) hours as needed for pain.     nadolol 40 MG tablet  Commonly known as:  CORGARD  Take  40 mg by mouth 2 (two) times daily.     omeprazole 20 MG capsule  Commonly known as:  PRILOSEC  Take 20 mg by mouth daily.     ondansetron 4 MG disintegrating tablet  Commonly known as:  ZOFRAN-ODT  Take 4 mg by mouth every 8 (eight) hours as needed for nausea.     rifaximin 550 MG Tabs  Commonly known as:  XIFAXAN  Take 550 mg by mouth daily.     sertraline 50 MG tablet  Commonly known as:  ZOLOFT  Take 50 mg by mouth daily.     spironolactone 100 MG tablet  Commonly known as:  ALDACTONE  Take 100 mg by mouth 2 (two) times daily.           Follow-up Information   Follow up with BURNETT,BRENT A, MD In 1 week.   Contact information:   [address] [place] [address] [phone]       Follow up with Adrian Compton In 1 week.       The results of significant diagnostics from this hospitalization (including imaging, microbiology, ancillary and  laboratory) are listed below for reference.    Significant Diagnostic Studies: Ct Abdomen Pelvis Wo Contrast  01/24/2013  *RADIOLOGY REPORT*  Clinical Data: Abdominal pain and distension  CT ABDOMEN AND PELVIS WITHOUT CONTRAST  Technique:  Multidetector CT imaging of the abdomen and pelvis was performed following the standard protocol without intravenous contrast.  Comparison: 01/12/2013 ultrasound for paracentesis.  Findings: The lung bases are hyperinflated.  Heart size within normal limits.  Low attenuation of the blood pool suggests anemia.  Organ abnormality/lesion detection is limited in the absence of intravenous contrast. Within this limitation, lobular hepatic contour.  Gallbladder wall edema and pericholecystic stranding.  No radiodense stones.  Splenomegaly.  Unremarkable pancreas and adrenal glands.  Symmetric renal size.  No hydronephrosis or hydroureter.  Colonic diverticulosis.  No CT evidence for colitis.  Appendix not identified.  No right lower quadrant inflammation.  No bowel obstruction. Duodenal diverticulum.  Advanced atherosclerotic disease of the aorta and branch vessels. Further vascular evaluation not possible without intravenous contrast.  Large amount ascites, measures water attenuation.  Distended appearance to the intrahepatic and main portal veins, with homogeneous/high attenuation.  Para esophageal collaterals.  No free intraperitoneal air.  Thin-walled bladder.  Multilevel degenerative changes of the imaged spine. No acute or aggressive appearing osseous lesion.  IMPRESSION: Cirrhotic liver morphology with stigmata of portal hypertension, including a large amount ascites, splenomegaly, and varices.  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  Note that hepatocellular carcinoma and tumor thrombus cannot be excluded without intravenous contrast.  Edematous gallbladder wall is nonspecific and may reflect  sequelae of hepatitis.  Correlate clinically if concerned for acute gallbladder pathology.   Original Report Authenticated By: Jearld Lesch, M.D.    US Paracentesis  01/25/2013  *RADIOLOGY REPORT*  Clinical Data: Hepatitis C, cirrhosis, recurrent ascites.  Request is made for therapeutic paracentesis up to 7 liters.  ULTRASOUND GUIDED THERAPEUTIC  PARACENTESIS  An ultrasound guided paracentesis was thoroughly discussed with the patient and questions answered.  The benefits, risks, alternatives and complications were also discussed.  The patient understands and wishes to proceed with the procedure.  Written consent was obtained.  Ultrasound was performed to localize and mark an adequate pocket of fluid in the left lower quadrant of the abdomen.  The area was then prepped and draped in the normal  sterile fashion.  1% Lidocaine was used for local anesthesia.  Under ultrasound guidance a 19 gauge Yueh catheter was introduced.  Paracentesis was performed.  The catheter was removed and a dressing applied.  Complications:  none  Findings:  A total of approximately 5.2 liters of yellow fluid was removed.  IMPRESSION: Successful ultrasound guided therapeutic paracentesis yielding 5.2 liters of ascites.  Read by: Jeananne Rama, P.A.-C   Original Report Authenticated By: Jolaine Click, M.D.    US Paracentesis  01/12/2013  *RADIOLOGY REPORT*  Clinical Data: Cirrhosis, recurrent ascites.  Request is made for therapeutic paracentesis up to 7 liters.  ULTRASOUND GUIDED THERAPEUTIC  PARACENTESIS  An ultrasound guided paracentesis was thoroughly discussed with the patient and questions answered.  The benefits, risks, alternatives and complications were also discussed.  The patient understands and wishes to proceed with the procedure.  Written consent was obtained.  Ultrasound was performed to localize and mark an adequate pocket of fluid in the right lower quadrant of the abdomen.  The area was then prepped and draped in the  normal sterile fashion.  1% Lidocaine was used for local anesthesia.  Under ultrasound guidance a 19 gauge Yueh catheter was introduced.  Paracentesis was performed.  The catheter was removed and a dressing applied.  Complications:  none  Findings:  A total of approximately 7 liters of yellow fluid was removed.  IMPRESSION: Successful ultrasound guided therapeutic paracentesis yielding 7 liters of ascites. The patient will receive IV albumin infusion post procedure.  Read by: Jeananne Rama, P.A.-C   Original Report Authenticated By: Tacey Ruiz, MD     Microbiology: Recent Results (from the past 240 hour(s))  CULTURE, BLOOD (ROUTINE X 2)     Status: None   Collection Time    01/23/13  7:45 PM      Result Value Range Status   Specimen Description BLOOD ARM RIGHT   Final   Special Requests BOTTLES DRAWN AEROBIC AND ANAEROBIC 10CC   Final   Culture  Setup Time 01/24/2013 00:22   Final   Culture NO GROWTH 5 DAYS   Final   Report Status 01/30/2013 FINAL   Final  CULTURE, BLOOD (ROUTINE X 2)     Status: None   Collection Time    01/23/13  7:55 PM      Result Value Range Status   Specimen Description BLOOD ARM LEFT   Final   Special Requests     Final   Value: BOTTLES DRAWN AEROBIC AND ANAEROBIC 10CC BLUE 5CCRED   Culture  Setup Time 01/24/2013 00:23   Final   Culture NO GROWTH 5 DAYS   Final   Report Status 01/30/2013 FINAL   Final  MRSA PCR SCREENING     Status: None   Collection Time    01/24/13  2:25 AM      Result Value Range Status   MRSA by PCR NEGATIVE  NEGATIVE Final   Comment:            The GeneXpert MRSA Assay (FDA     approved for NASAL specimens     only), is one component of a     comprehensive MRSA colonization     surveillance program. It is not     intended to diagnose MRSA     infection nor to guide or     monitor treatment for     MRSA infections.  BODY FLUID CULTURE     Status: None   Collection  Time    01/25/13 10:46 AM      Result Value Range Status    Specimen Description ASCITIC ABDOMEN FLUID   Final   Special Requests FLUID   Final   Gram Stain     Final   Value: NO WBC SEEN     NO ORGANISMS SEEN   Culture NO GROWTH 3 DAYS   Final   Report Status 01/28/2013 FINAL   Final     Labs: Basic Metabolic Panel:  Recent Labs Lab 01/25/13 0500 01/26/13 0525 01/27/13 0450 01/28/13 0600 01/29/13 0649  NA 129* 127* 126* 126* 129*  K 4.7 4.6 5.0 5.0 3.8  CL 97 95* 95* 94* 95*  CO2 22 22 24 27 28   GLUCOSE 133* 133* 107* 98 88  BUN 60* 51* 34* 24* 18  CREATININE 0.88 0.84 0.89 0.85 0.94  CALCIUM 8.6 9.0 8.9 8.4 8.1*   Liver Function Tests:  Recent Labs Lab 01/24/13 0500 01/25/13 0500 01/26/13 0525 01/27/13 0450 01/29/13 0649  AST 23 18 18 26 19   ALT 27 22 20 24 20   ALKPHOS 39 34* 37* 67 63  BILITOT 0.5 0.4 0.4 0.4 0.4  PROT 6.0 6.0 6.0 5.9* 5.6*  ALBUMIN 2.6* 2.7* 2.9* 2.9* 2.6*    Recent Labs Lab 01/24/13 0500  LIPASE 35    Recent Labs Lab 01/23/13 2104 01/25/13 0555  AMMONIA 64* 49   CBC:  Recent Labs Lab 01/23/13 1953 01/24/13 0500 01/25/13 0500 01/26/13 0525 01/27/13 0450 01/28/13 0600 01/29/13 0649  WBC 13.3* 16.0* 17.2* 15.8* 13.2* 10.0 8.3  NEUTROABS 10.2* 13.8*  --   --   --   --   --   HGB 11.4* 9.1* 7.4* 6.2* 6.9* 8.3* 8.3*  HCT 33.5* 25.6* 20.8* 17.6* 19.4* 23.0* 23.5*  MCV 85.0 83.7 82.9 83.4 85.1 83.0 85.1  PLT 152 121* 105* 92* 96* 85* 79*   Cardiac Enzymes:  Recent Labs Lab 01/23/13 1953  TROPONINI <0.30   BNP: BNP (last 3 results)  Recent Labs  01/23/13 1953  PROBNP 30.1   CBG:  Recent Labs Lab 01/29/13 0741 01/29/13 1143 01/29/13 1724 01/29/13 2323 01/30/13 0808  GLUCAP 88 119* 96 84 97       Signed:  Derreck Wiltsey A  [facility] 01/30/2013, 10:18 AM

## 2013-01-30 NOTE — Clinical Social Work Placement (Signed)
Clinical Social Work Department CLINICAL SOCIAL WORK PLACEMENT NOTE 01/30/2013  Patient:  Adrian Compton, Adrian Compton  Account Number:  [id number] Admit date:  01/23/2013  Clinical Social Worker:  Johnsie Cancel  Date/time:  01/29/2013 09:28 AM  Clinical Social Work is seeking post-discharge placement for this patient at the following level of care:   SKILLED NURSING   (*CSW will update this form in Epic as items are completed)   01/29/2013  Patient/family provided with [facility] Department of Clinical Social Work's list of facilities offering this level of care within the geographic area requested by the patient (or if unable, by the patient's family).  01/29/2013  Patient/family informed of their freedom to choose among providers that offer the needed level of care, that participate in Medicare, Medicaid or managed care program needed by the patient, have an available bed and are willing to accept the patient.  01/29/2013  Patient/family informed of [facility]' ownership interest in [facility], as well as of the fact that they are under no obligation to receive care at this facility.  PASARR submitted to EDS on  PASARR number received from EDS on   FL2 transmitted to all facilities in geographic area requested by pt/family on  01/29/2013 FL2 transmitted to all facilities within larger geographic area on   Patient informed that his/her managed care company has contracts with or will negotiate with  certain facilities, including the following:     Patient/family informed of bed offers received:   Patient chooses bed at  Physician recommends and patient chooses bed at    Patient to be transferred to  on   Patient to be transferred to facility by   The following physician request were entered in Epic:   Additional Comments:  Lia Foyer, LCSWA [facility] Clinical Social Worker Contact #: [phone]

## 2013-01-30 NOTE — Progress Notes (Signed)
NUTRITION FOLLOW UP  Intervention:   No nutrition interventions at this time  Nutrition Dx:   Inadequate oral intake related to nausea, abdominal pain as evidenced by pt report, NPO.    Goal:   Diet advance with tolerance. Met  Monitor:   PO intake, weight trends.   Assessment:   S/p paracentesis on 2/20, 5.2 L off. Appetite has improved, at 90% breakfast.  Denies any nutrition needs at this time.   Height: Ht Readings from Last 1 Encounters:  01/24/13 6\' 3"  (1.905 m)    Weight Status:   Wt Readings from Last 1 Encounters:  01/30/13 175 lb 0.7 oz (79.4 kg)  Admission weight: 187 lbs   Re-estimated needs:  Kcal: 2200-2450  Protein: 80-95g  Fluid: per MD discretion, pt has been on 1.5 L fluid restriction   Skin: Intact   Diet Order: Cardiac   Intake/Output Summary (Last 24 hours) at 01/30/13 0924 Last data filed at 01/29/13 2230  Gross per 24 hour  Intake    320 ml  Output      0 ml  Net    320 ml    Last BM: 2/24   Labs:   Recent Labs Lab 01/27/13 0450 01/28/13 0600 01/29/13 0649  NA 126* 126* 129*  K 5.0 5.0 3.8  CL 95* 94* 95*  CO2 24 27 28   BUN 34* 24* 18  CREATININE 0.89 0.85 0.94  CALCIUM 8.9 8.4 8.1*  GLUCOSE 107* 98 88    CBG (last 3)   Recent Labs  01/29/13 1724 01/29/13 2323 01/30/13 0808  GLUCAP 96 84 97    Scheduled Meds: . furosemide  20 mg Oral Daily  . nadolol  20 mg Oral BID  . rifaximin  550 mg Oral Daily  . sertraline  50 mg Oral Daily  . sodium chloride  3 mL Intravenous Q12H  . sodium chloride  3 mL Intravenous Q12H  . spironolactone  50 mg Oral BID    Continuous Infusions:   Clarene Duke RD, LDN Pager [phone] After Hours pager [phone]

## 2013-01-30 NOTE — Clinical Social Work Psychosocial (Signed)
Clinical Social Work Department BRIEF PSYCHOSOCIAL ASSESSMENT 01/30/2013  Patient:  Adrian Compton, Adrian Compton     Account Number:  [id number]     Admit date:  01/23/2013  Clinical Social Worker:  Johnsie Cancel  Date/Time:  01/29/2013 09:12 AM  Referred by:  Physician  Date Referred:  01/29/2013 Referred for  SNF Placement   Other Referral:   Interview type:  Patient Other interview type:   Wife, at patient's bedside.    PSYCHOSOCIAL DATA Living Status:  FAMILY Primary support name:  Adrian Compton Primary support relationship to patient:  SPOUSE Degree of support available:   Adequate, at patient's bedside.    CURRENT CONCERNS Current Concerns  Post-Acute Placement   Other Concerns:    SOCIAL WORK ASSESSMENT / PLAN CSW consulted re: SNF placement. CSW completed chart review and met with patient and spouse at bedside. CSW provided support to patient and explained CSW role. Patient and spouse requested SNF placement due to medical instability. CSW will fax patient out to [facility].   Assessment/plan status:  Information/Referral to [facility] Other assessment/ plan:   Information/referral to community resources:    PATIENT'S/FAMILY'S RESPONSE TO PLAN OF CARE: Patient and spouse thanked CSW for assisting in d/c and providing support.    Lia Foyer, LCSWA [facility] Clinical Social Worker Contact #: [phone]

## 2013-01-30 NOTE — Progress Notes (Signed)
Adrian Compton to be D/C'd Home per MD order.  Discharge instructions reviewed and discussed with the patient, all questions and concerns answered. Copy of instructions and scripts given to patient. Patients skin is clean, dry and intact no evidence of skin break down. IV site discontinued and catheter remains intact. Site without signs and symptoms of complications. Dressing and pressure applied.  Patient escorted to car by volunteers in a wheelchair,  no distress noted upon discharge.  Bing Quarry 01/30/2013 1:11 PM

## 2013-01-30 NOTE — Clinical Social Work Note (Signed)
CSW received consult for SNF. Per PT notes, patient will go home with PT. CSW contacted facility that offered bed, patient no longer needs SNF. CSW signing off, RNCM is aware of HHPT needs. No other psychosocial concerns identified. Please re-consult as needed.  Lia Foyer, LCSWA [facility] Clinical Social Worker Contact #: [phone]

## 2013-03-05 ENCOUNTER — Other Ambulatory Visit (HOSPITAL_COMMUNITY): Payer: Self-pay | Admitting: Orthodontics

## 2013-03-05 DIAGNOSIS — R188 Other ascites: Secondary | ICD-10-CM

## 2013-03-06 ENCOUNTER — Ambulatory Visit (HOSPITAL_COMMUNITY)
Admission: RE | Admit: 2013-03-06 | Discharge: 2013-03-06 | Disposition: A | Discharge status: Home / Self Care | Payer: Managed Care, Other (non HMO) | Source: Ambulatory Visit | Attending: Orthodontics | Admitting: Orthodontics

## 2013-03-06 DIAGNOSIS — R188 Other ascites: Secondary | ICD-10-CM

## 2013-03-06 MED ORDER — ALBUMIN HUMAN 25 % IV SOLN
25.0000 g | Freq: Once | INTRAVENOUS | Status: AC
  Administered 2013-03-06: 25 g via INTRAVENOUS
  Filled 2013-03-06: qty 100

## 2013-03-06 NOTE — Procedures (Signed)
Successful US guided paracentesis from RLQ.  Yielded 7L of clear yellow fluid.  No immediate complications.  Pt tolerated well.   Specimen was not sent for labs. The patient was sent to short stay center for administration of IV Albumin 25g as ordered.  Brayton El PA-C 03/06/2013 10:11 AM

## 2013-04-02 ENCOUNTER — Encounter (HOSPITAL_COMMUNITY): Payer: Self-pay | Admitting: Nurse Practitioner

## 2013-04-02 ENCOUNTER — Emergency Department (HOSPITAL_COMMUNITY)
Admission: EM | Admit: 2013-04-02 | Discharge: 2013-04-02 | Payer: Managed Care, Other (non HMO) | Attending: Emergency Medicine | Admitting: Emergency Medicine

## 2013-04-02 DIAGNOSIS — R109 Unspecified abdominal pain: Secondary | ICD-10-CM | POA: Insufficient documentation

## 2013-04-02 DIAGNOSIS — B182 Chronic viral hepatitis C: Secondary | ICD-10-CM | POA: Insufficient documentation

## 2013-04-02 DIAGNOSIS — L539 Erythematous condition, unspecified: Secondary | ICD-10-CM | POA: Insufficient documentation

## 2013-04-02 DIAGNOSIS — K7460 Unspecified cirrhosis of liver: Secondary | ICD-10-CM | POA: Insufficient documentation

## 2013-04-02 DIAGNOSIS — K429 Umbilical hernia without obstruction or gangrene: Secondary | ICD-10-CM

## 2013-04-02 DIAGNOSIS — Z96659 Presence of unspecified artificial knee joint: Secondary | ICD-10-CM | POA: Insufficient documentation

## 2013-04-02 DIAGNOSIS — Z79899 Other long term (current) drug therapy: Secondary | ICD-10-CM | POA: Insufficient documentation

## 2013-04-02 DIAGNOSIS — R188 Other ascites: Secondary | ICD-10-CM | POA: Insufficient documentation

## 2013-04-02 DIAGNOSIS — Z7982 Long term (current) use of aspirin: Secondary | ICD-10-CM | POA: Insufficient documentation

## 2013-04-02 DIAGNOSIS — Z7901 Long term (current) use of anticoagulants: Secondary | ICD-10-CM | POA: Insufficient documentation

## 2013-04-02 DIAGNOSIS — K469 Unspecified abdominal hernia without obstruction or gangrene: Secondary | ICD-10-CM

## 2013-04-02 LAB — CBC WITH DIFFERENTIAL/PLATELET
Eosinophils Relative: 2 % (ref 0–5)
HCT: 30.7 % — ABNORMAL LOW (ref 39.0–52.0)
Lymphs Abs: 0.2 10*3/uL — ABNORMAL LOW (ref 0.7–4.0)
MCV: 73.8 fL — ABNORMAL LOW (ref 78.0–100.0)
Monocytes Relative: 10 % (ref 3–12)
Neutro Abs: 2.3 10*3/uL (ref 1.7–7.7)
RBC: 4.16 MIL/uL — ABNORMAL LOW (ref 4.22–5.81)
WBC: 2.9 10*3/uL — ABNORMAL LOW (ref 4.0–10.5)

## 2013-04-02 LAB — COMPREHENSIVE METABOLIC PANEL
BUN: 18 mg/dL (ref 6–23)
CO2: 22 mEq/L (ref 19–32)
Chloride: 101 mEq/L (ref 96–112)
Creatinine, Ser: 0.97 mg/dL (ref 0.50–1.35)
GFR calc non Af Amer: >90 mL/min (ref >90)
Total Bilirubin: 0.4 mg/dL (ref 0.3–1.2)

## 2013-04-02 LAB — PROTIME-INR
INR: 1.77 — ABNORMAL HIGH (ref 0.00–1.49)
Prothrombin Time: 20 seconds — ABNORMAL HIGH (ref 11.6–15.2)

## 2013-04-02 MED ORDER — HYDROMORPHONE HCL PF 1 MG/ML IJ SOLN
1.0000 mg | Freq: Once | INTRAMUSCULAR | Status: AC
  Administered 2013-04-02: 1 mg via INTRAVENOUS
  Filled 2013-04-02: qty 1

## 2013-04-02 MED ORDER — HYDROMORPHONE HCL PF 1 MG/ML IJ SOLN
1.0000 mg | Freq: Once | INTRAMUSCULAR | Status: AC
  Administered 2013-04-02: 1 mg via INTRAVENOUS
  Filled 2013-04-02 (×2): qty 1

## 2013-04-02 MED ORDER — ONDANSETRON HCL 4 MG/2ML IJ SOLN
4.0000 mg | Freq: Once | INTRAMUSCULAR | Status: AC
  Administered 2013-04-02: 4 mg via INTRAVENOUS
  Filled 2013-04-02: qty 2

## 2013-04-02 MED ORDER — CEFTRIAXONE SODIUM 1 G IJ SOLR
1.0000 g | Freq: Once | INTRAMUSCULAR | Status: DC
  Filled 2013-04-02: qty 10

## 2013-04-02 MED ORDER — SODIUM CHLORIDE 0.9 % IV BOLUS (SEPSIS)
1000.0000 mL | Freq: Once | INTRAVENOUS | Status: AC
  Administered 2013-04-02: 1000 mL via INTRAVENOUS

## 2013-04-02 MED ORDER — PIPERACILLIN-TAZOBACTAM 3.375 G IVPB
3.3750 g | Freq: Three times a day (TID) | INTRAVENOUS | Status: DC
  Administered 2013-04-02: 3.375 g via INTRAVENOUS
  Filled 2013-04-02 (×3): qty 50

## 2013-04-02 NOTE — ED Notes (Signed)
Gave report to Alvino Chapel, Charity fundraiser at [facility].  Reported pt presentation and dx, full code status, allergies and medications administered here in the [facility] ED.  Reported the number for pt's wife, Shadd Dunstan... [phone] (cell).  Informed pt and wife that [facility] is en route.

## 2013-04-02 NOTE — ED Notes (Signed)
MD at bedside.  Dr. Zola Button at bedside (surgery).

## 2013-04-02 NOTE — ED Notes (Signed)
Per MD: Pt will not be transferring to [facility] ED at this time.

## 2013-04-02 NOTE — ED Notes (Signed)
Applied new ABD bandage to umbilicus area.  The first ABD was 3/4 saturated with serous-yellowish discharge.

## 2013-04-02 NOTE — Progress Notes (Signed)
ANTIBIOTIC CONSULT NOTE - INITIAL  Pharmacy Consult for Zosyn Indication: Ruptured umbilical hernia  Allergies  Allergen Reactions  . Nuvigil (Armodafinil) Anaphylaxis and Hives   Patient Measurements:   TBW 79.4 kg from 2/14  Vital Signs: Temp: 97.9 F (36.6 C) (04/28 0948) Temp src: Oral (04/28 0948) BP: 144/103 mmHg (04/28 0948) Pulse Rate: 90 (04/28 0948) Intake/Output from previous day:   Intake/Output from this shift:    Labs:  Recent Labs  04/02/13 1032  WBC 2.9*  HGB 9.1*  PLT 86*  CREATININE 0.97   The CrCl is unknown because both a height and weight (above a minimum accepted value) are required for this calculation. No results found for this basename: VANCOTROUGH, VANCOPEAK, VANCORANDOM, GENTTROUGH, GENTPEAK, GENTRANDOM, TOBRATROUGH, TOBRAPEAK, TOBRARND, AMIKACINPEAK, AMIKACINTROU, AMIKACIN,  in the last 72 hours   Microbiology: No results found for this or any previous visit (from the past 720 hour(s)).  Medical History: Past Medical History  Diagnosis Date  . Hepatitis C   . Cirrhosis of liver due to hepatitis C   . Presence of right artificial knee joint   . Cirrhosis of liver    Medications:  Anti-infectives   Start     Dose/Rate Route Frequency Ordered Stop   04/02/13 1245  piperacillin-tazobactam (ZOSYN) IVPB 3.375 g     3.375 g 12.5 mL/hr over 240 Minutes Intravenous 3 times per day 04/02/13 1230     04/02/13 1015  cefTRIAXone (ROCEPHIN) 1 g in dextrose 5 % 50 mL IVPB  Status:  Discontinued     1 g 100 mL/hr over 30 Minutes Intravenous  Once 04/02/13 1002 04/02/13 1229     Assessment: 57 yoM with ruptured umbilical hernia; spontaneous paracentesis ~ 7 L based on stated weight loss. Hx of stage IV cirrhosis with Hepatitis C. Peritonitis treated with Rocephin 2/14.   Hx of Portal Vein thrombosis x2, current treatment with Warfarin, 5mg  daily with last dose 4/27. INR today 1.77  Planning TIPS procedure when Warfarin course completed, and  on transplant list.  Goal of Therapy:  Abx appropriate for disease state, renal function  Plan:  Zosyn 3.375 gm IV q8h- 4 hr infusion Blood cultures obtained  Otho Bellows PharmD Pager [phone] 04/02/2013, 2:17 PM
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
## 2013-04-02 NOTE — Consult Note (Signed)
Pt with perforation of umbilical hernia and ascites leak.  Hep C cirrhosis with PV thrombosis.  There is a small pinhole with associated necrotic tissue.  He is being treated with antibiotics.  His ascites has almost stopped leaking now.  His vitals are stable.  He should be transferred to his transplant center for further evaluation and treatment.

## 2013-04-02 NOTE — ED Notes (Addendum)
Per EMS:  Pt c/o diffuse abdominal pain 6/10 and having leakage from umbilicus.  Pt states that he has had 7 paracentesis procedures.  Denies n/v/d.  Pt is on liver transplant list in [place].

## 2013-04-02 NOTE — ED Notes (Signed)
Report given to [facility]

## 2013-04-02 NOTE — ED Notes (Signed)
Per MD- draw blood from same site.

## 2013-04-02 NOTE — ED Notes (Signed)
[id number]<AV> Expected date:<BR> Expected time:<BR> Means of arrival:<BR> Comments:<BR> ems /abd pain

## 2013-04-03 MED FILL — Hydromorphone HCl Preservative Free (PF) Inj 2 MG/ML: INTRAMUSCULAR | Qty: 1 | Status: AC

## 2013-04-08 LAB — CULTURE, BLOOD (ROUTINE X 2)

## 2013-04-16 ENCOUNTER — Other Ambulatory Visit (HOSPITAL_COMMUNITY): Payer: Self-pay | Admitting: Orthodontics

## 2013-04-16 ENCOUNTER — Ambulatory Visit (HOSPITAL_COMMUNITY)
Admission: RE | Admit: 2013-04-16 | Discharge: 2013-04-16 | Disposition: A | Discharge status: Home / Self Care | Payer: Managed Care, Other (non HMO) | Source: Ambulatory Visit | Attending: Orthodontics | Admitting: Orthodontics

## 2013-04-16 DIAGNOSIS — R188 Other ascites: Secondary | ICD-10-CM

## 2013-04-16 LAB — BODY FLUID CELL COUNT WITH DIFFERENTIAL: Eos, Fluid: 0 %

## 2013-04-16 LAB — PROTIME-INR: INR: 1.55 — ABNORMAL HIGH (ref 0.00–1.49)

## 2013-04-16 MED ORDER — ALBUMIN HUMAN 25 % IV SOLN
25.0000 g | Freq: Once | INTRAVENOUS | Status: AC
  Administered 2013-04-16: 25 g via INTRAVENOUS
  Filled 2013-04-16: qty 100

## 2013-04-16 NOTE — Procedures (Signed)
US guided LLQ para  7 Liters yellow fluid Pt did well  Post IV albumin per MD

## 2013-04-17 ENCOUNTER — Other Ambulatory Visit (HOSPITAL_COMMUNITY): Payer: Managed Care, Other (non HMO)

## 2013-04-20 LAB — BODY FLUID CULTURE
Culture: NO GROWTH
Gram Stain: NONE SEEN

## 2013-05-14 ENCOUNTER — Other Ambulatory Visit (HOSPITAL_COMMUNITY): Payer: Self-pay | Admitting: *Deleted

## 2013-05-14 DIAGNOSIS — R188 Other ascites: Secondary | ICD-10-CM

## 2013-05-15 ENCOUNTER — Ambulatory Visit (HOSPITAL_COMMUNITY)
Admission: RE | Admit: 2013-05-15 | Discharge: 2013-05-15 | Disposition: A | Discharge status: Home / Self Care | Payer: Managed Care, Other (non HMO) | Source: Ambulatory Visit | Attending: Family Medicine | Admitting: Family Medicine

## 2013-05-15 DIAGNOSIS — R188 Other ascites: Secondary | ICD-10-CM | POA: Insufficient documentation

## 2013-05-15 MED ORDER — ALBUMIN HUMAN 25 % IV SOLN
25.0000 g | Freq: Once | INTRAVENOUS | Status: AC
  Administered 2013-05-15: 25 g via INTRAVENOUS
  Filled 2013-05-15: qty 100

## 2013-05-15 NOTE — Procedures (Signed)
Successful US guided paracentesis from RLQ.  Yielded 7L of clear yellow fluid.  No immediate complications.  Pt tolerated well.   Specimen was not sent for labs.  Brayton El PA-C 05/15/2013 10:26 AM

## 2013-09-19 ENCOUNTER — Other Ambulatory Visit (HOSPITAL_COMMUNITY): Payer: Self-pay | Admitting: Orthodontics

## 2013-09-19 DIAGNOSIS — R188 Other ascites: Secondary | ICD-10-CM

## 2013-09-20 ENCOUNTER — Ambulatory Visit (HOSPITAL_COMMUNITY)
Admission: RE | Admit: 2013-09-20 | Discharge: 2013-09-20 | Disposition: A | Discharge status: Home / Self Care | Payer: Managed Care, Other (non HMO) | Source: Ambulatory Visit | Attending: Orthodontics | Admitting: Orthodontics

## 2013-09-20 ENCOUNTER — Other Ambulatory Visit (HOSPITAL_COMMUNITY): Payer: Self-pay | Admitting: Orthodontics

## 2013-09-20 DIAGNOSIS — K7460 Unspecified cirrhosis of liver: Secondary | ICD-10-CM | POA: Insufficient documentation

## 2013-09-20 DIAGNOSIS — R188 Other ascites: Secondary | ICD-10-CM

## 2013-09-20 MED ORDER — ALBUMIN HUMAN 25 % IV SOLN
50.0000 g | Freq: Once | INTRAVENOUS | Status: DC
  Filled 2013-09-20: qty 200

## 2013-09-20 NOTE — Procedures (Signed)
Successful US guided paracentesis from RLQ.  Yielded 6.5L of clear yellow fluid.  No immediate complications.  Pt tolerated well.   Specimen was not sent for labs.  Brayton El PA-C 09/20/2013 1:53 PM

## 2013-10-30 ENCOUNTER — Other Ambulatory Visit (HOSPITAL_COMMUNITY): Payer: Self-pay | Admitting: *Deleted

## 2013-10-30 DIAGNOSIS — R188 Other ascites: Secondary | ICD-10-CM

## 2013-11-02 ENCOUNTER — Ambulatory Visit (HOSPITAL_COMMUNITY)
Admission: RE | Admit: 2013-11-02 | Discharge: 2013-11-02 | Disposition: A | Discharge status: Home / Self Care | Payer: Managed Care, Other (non HMO) | Source: Ambulatory Visit | Attending: Orthodontics | Admitting: Orthodontics

## 2013-11-02 DIAGNOSIS — R188 Other ascites: Secondary | ICD-10-CM | POA: Insufficient documentation

## 2013-11-02 MED ORDER — ALBUMIN HUMAN 25 % IV SOLN
50.0000 g | Freq: Once | INTRAVENOUS | Status: AC
  Administered 2013-11-02: 50 g via INTRAVENOUS
  Filled 2013-11-02: qty 200

## 2013-11-02 NOTE — Procedures (Signed)
Successful US guided paracentesis from RLQ.  Yielded 3.9L of clear yellow fluid.  No immediate complications.  Pt tolerated well.   Specimen was not sent for labs.  Brayton El PA-C 11/02/2013 12:44 PM

## 2013-11-07 ENCOUNTER — Ambulatory Visit (HOSPITAL_COMMUNITY): Payer: Managed Care, Other (non HMO)

## 2013-11-19 ENCOUNTER — Other Ambulatory Visit (HOSPITAL_COMMUNITY): Payer: Self-pay | Admitting: Orthodontics

## 2013-11-19 DIAGNOSIS — R188 Other ascites: Secondary | ICD-10-CM

## 2013-11-22 ENCOUNTER — Ambulatory Visit (HOSPITAL_COMMUNITY)
Admission: RE | Admit: 2013-11-22 | Discharge: 2013-11-22 | Disposition: A | Discharge status: Home / Self Care | Payer: Managed Care, Other (non HMO) | Source: Ambulatory Visit | Attending: Orthodontics | Admitting: Orthodontics

## 2013-11-22 DIAGNOSIS — K7460 Unspecified cirrhosis of liver: Secondary | ICD-10-CM | POA: Insufficient documentation

## 2013-11-22 DIAGNOSIS — R188 Other ascites: Secondary | ICD-10-CM | POA: Insufficient documentation

## 2013-11-22 LAB — BODY FLUID CELL COUNT WITH DIFFERENTIAL
Lymphs, Fluid: 21 %
Monocyte-Macrophage-Serous Fluid: 66 % (ref 50–90)
Neutrophil Count, Fluid: 13 % (ref 0–25)

## 2013-11-22 MED ORDER — ALBUMIN HUMAN 25 % IV SOLN
50.0000 g | Freq: Once | INTRAVENOUS | Status: AC
  Administered 2013-11-22: 50 g via INTRAVENOUS
  Filled 2013-11-22: qty 200

## 2013-11-22 NOTE — Procedures (Addendum)
Successful US guided paracentesis from LLQ.  Yielded 7 liters of amber/yellow colored fluid.  No immediate complications.  Pt tolerated well.   Specimen was sent for labs.  Pattricia Boss D PA-C 11/22/2013 12:27 PM

## 2013-11-23 ENCOUNTER — Other Ambulatory Visit (HOSPITAL_COMMUNITY): Payer: Self-pay | Admitting: *Deleted

## 2013-11-23 DIAGNOSIS — R188 Other ascites: Secondary | ICD-10-CM

## 2013-12-11 ENCOUNTER — Other Ambulatory Visit (HOSPITAL_COMMUNITY): Payer: Self-pay | Admitting: Gastroenterology

## 2013-12-11 DIAGNOSIS — R188 Other ascites: Secondary | ICD-10-CM

## 2013-12-13 ENCOUNTER — Ambulatory Visit (HOSPITAL_COMMUNITY)
Admission: RE | Admit: 2013-12-13 | Discharge: 2013-12-13 | Disposition: A | Discharge status: Home / Self Care | Payer: Managed Care, Other (non HMO) | Source: Ambulatory Visit | Attending: Gastroenterology | Admitting: Gastroenterology

## 2013-12-13 DIAGNOSIS — K7460 Unspecified cirrhosis of liver: Secondary | ICD-10-CM | POA: Insufficient documentation

## 2013-12-13 DIAGNOSIS — R188 Other ascites: Secondary | ICD-10-CM

## 2013-12-13 MED ORDER — ALBUMIN HUMAN 25 % IV SOLN
50.0000 g | Freq: Once | INTRAVENOUS | Status: AC
  Administered 2013-12-13: 50 g via INTRAVENOUS
  Filled 2013-12-13: qty 200

## 2013-12-13 NOTE — Procedures (Signed)
Successful US guided paracentesis from LLQ.  Yielded 7L of clea ryellow fluid.  No immediate complications.  Pt tolerated well.   Specimen was not sent for labs.  Adrian Dike PA-C 12/13/2013 2:25 PM

## 2014-01-11 ENCOUNTER — Other Ambulatory Visit (HOSPITAL_COMMUNITY): Payer: Self-pay | Admitting: Gastroenterology

## 2014-01-11 ENCOUNTER — Ambulatory Visit (HOSPITAL_COMMUNITY)
Admission: RE | Admit: 2014-01-11 | Discharge: 2014-01-11 | Disposition: A | Discharge status: Home / Self Care | Payer: Managed Care, Other (non HMO) | Source: Ambulatory Visit | Attending: Gastroenterology | Admitting: Gastroenterology

## 2014-01-11 DIAGNOSIS — R188 Other ascites: Secondary | ICD-10-CM | POA: Insufficient documentation

## 2014-01-11 DIAGNOSIS — K7460 Unspecified cirrhosis of liver: Secondary | ICD-10-CM | POA: Insufficient documentation

## 2014-01-11 MED ORDER — ALBUMIN HUMAN 25 % IV SOLN
50.0000 g | Freq: Once | INTRAVENOUS | Status: AC
  Administered 2014-01-11: 50 g via INTRAVENOUS
  Filled 2014-01-11: qty 200

## 2014-01-11 NOTE — Procedures (Signed)
Successful US guided paracentesis from RLQ.  Yielded 7 liters of yellow colored fluid.  No immediate complications.  Pt tolerated well.  Post procedure IV albumin was given.  Specimen was not sent for labs.  Tsosie Billing D PA-C 01/11/2014 2:14 PM

## 2014-02-14 IMAGING — US US PARACENTESIS
1 series · 8 of 8 positions shown · non-contrast
Comparison: Previous paracentesis.

CLINICAL DATA: Cirrhosis, recurrent ascites.

EXAM:
ULTRASOUND GUIDED PARACENTESIS

[Series 1: us paracentesis · 0.27mm/px · 8 of 8 slices shown]
[im 1/8]
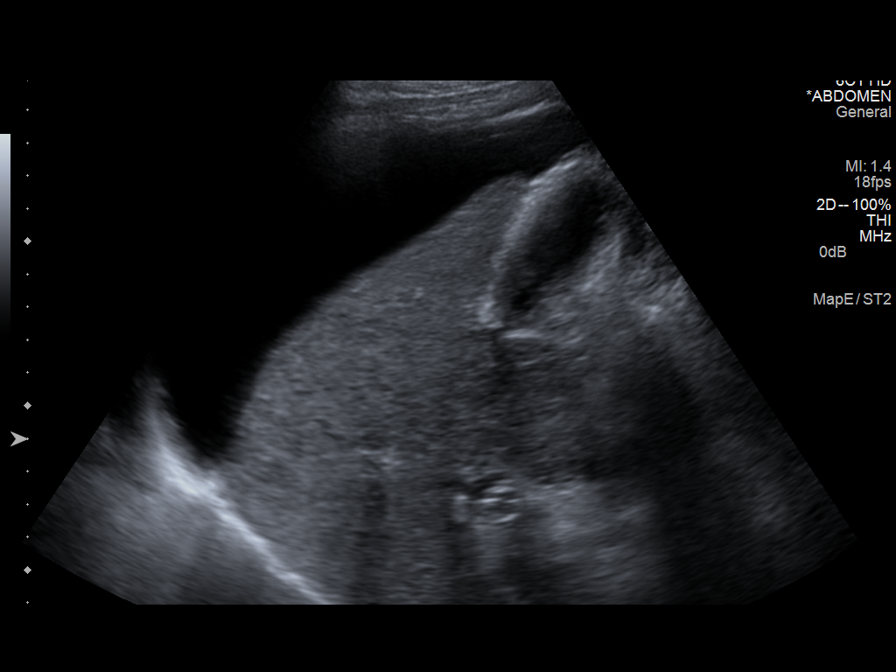
[im 2/8]
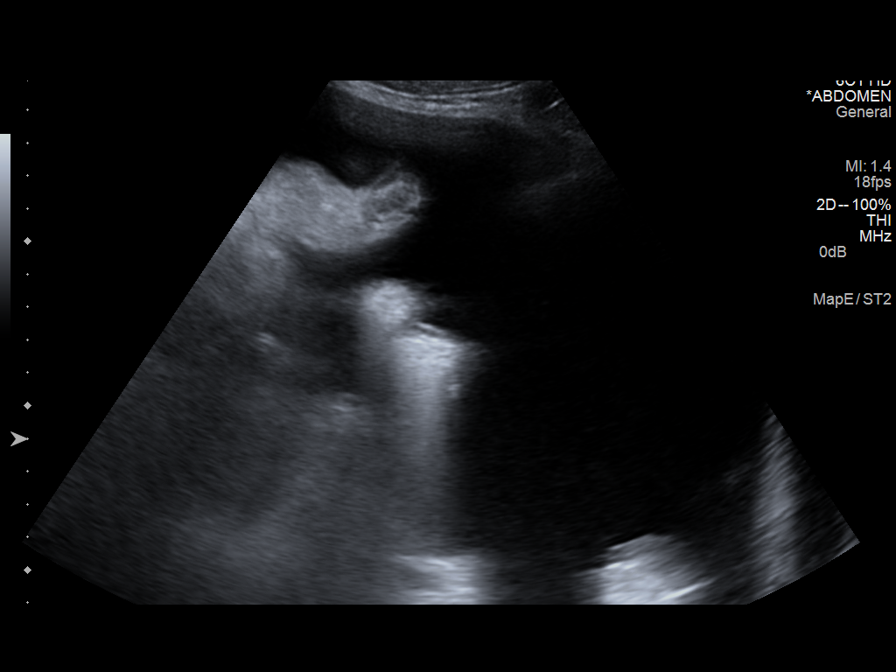
[im 3/8]
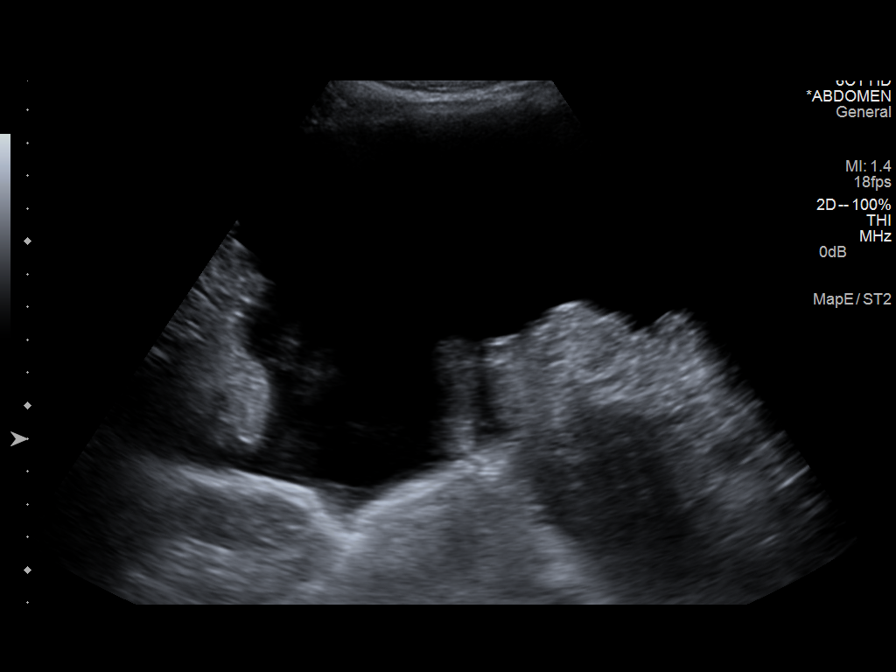
[im 4/8]
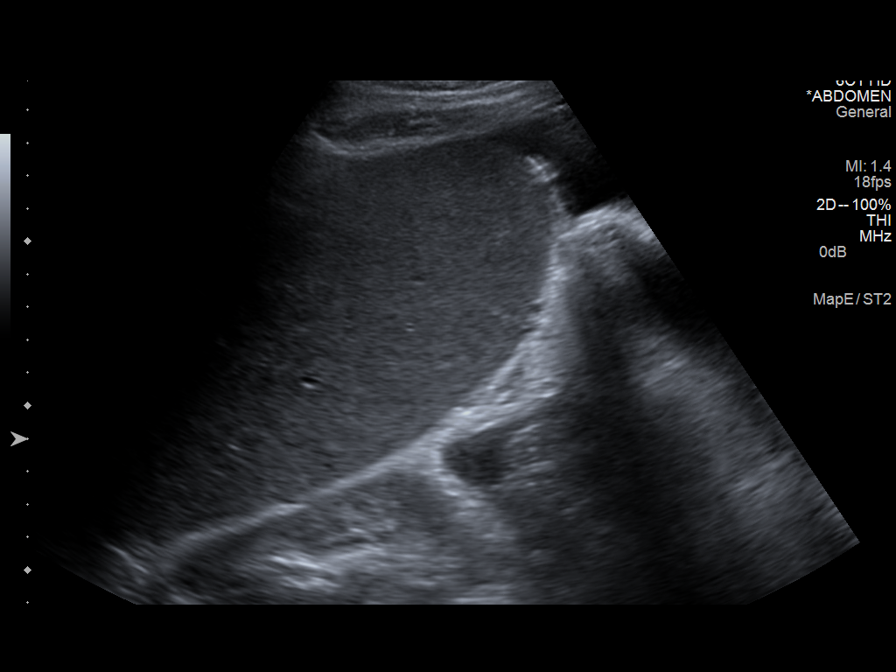
[im 5/8]
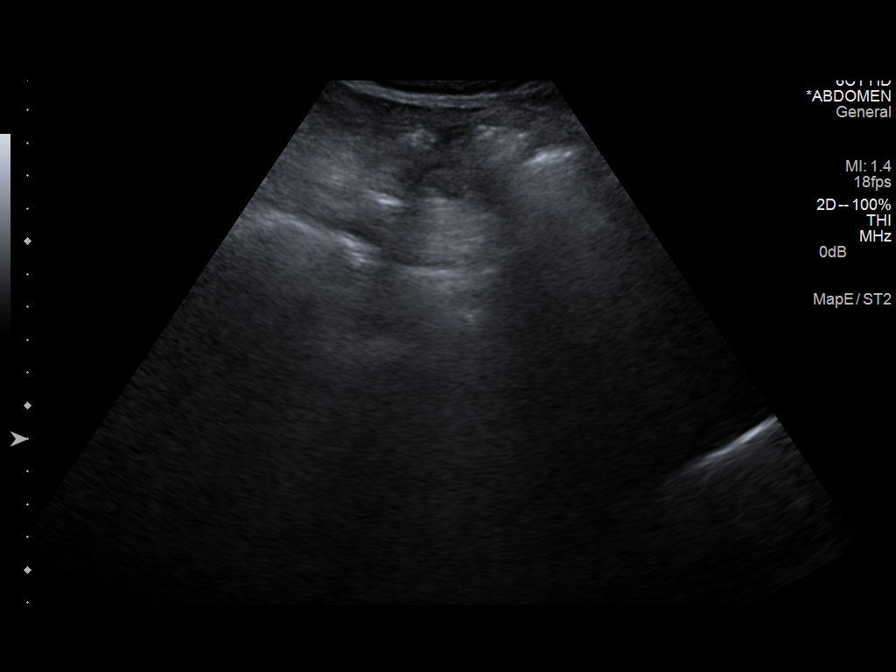
[im 6/8]
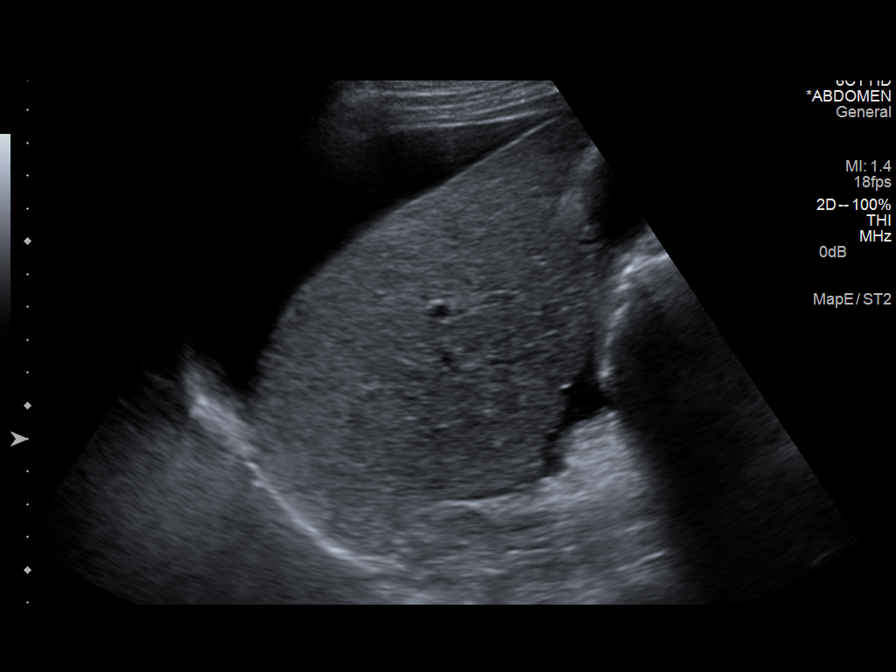
[im 7/8]
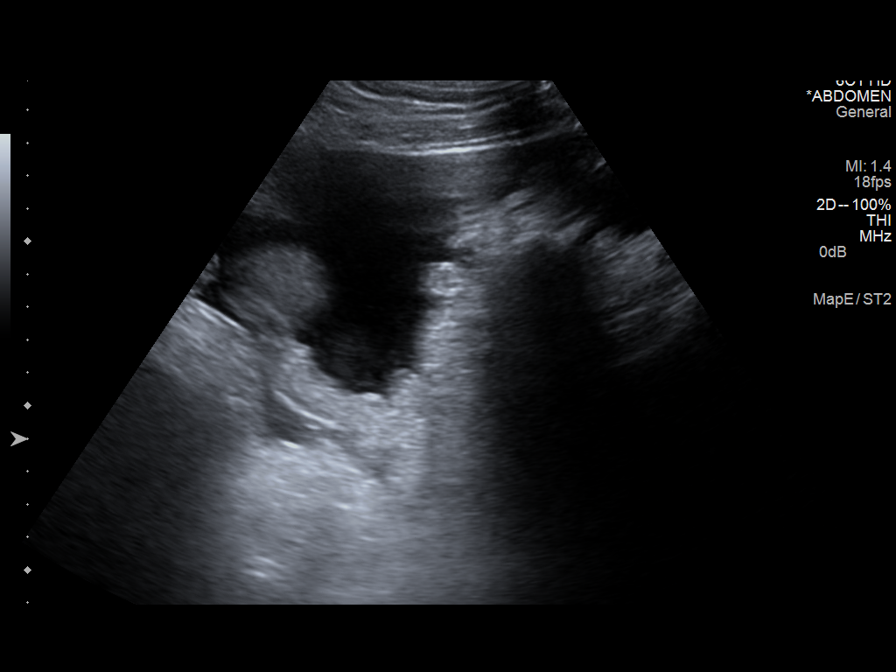
[im 8/8]
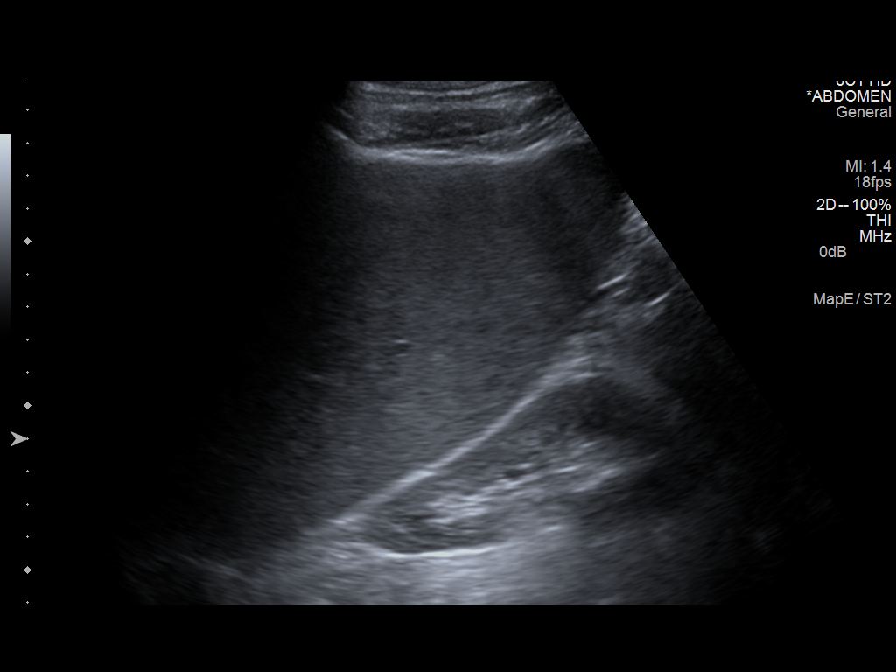

[8 of 8 positions shown; findings below may reference images not displayed]

PROCEDURE:
An ultrasound guided paracentesis was thoroughly discussed with the
patient and questions answered. The benefits, risks, alternatives
and complications were also discussed. The patient understands and
wishes to proceed with the procedure. Written consent was obtained.

Ultrasound was performed to localize and mark an adequate pocket of
fluid in the left lower quadrant of the abdomen. The area was then
prepped and draped in the normal sterile fashion. 1% Lidocaine was
used for local anesthesia. Under ultrasound guidance a 19 gauge Yueh
catheter was introduced. Paracentesis was performed. The catheter
was removed and a dressing applied.

COMPLICATIONS:
None immediate
FINDINGS: A total of approximately 7 L of clear yellow fluid was removed. A
fluid sample was not sent for laboratory analysis.
IMPRESSION: Successful ultrasound guided paracentesis yielding 7L of ascites.

## 2014-02-25 ENCOUNTER — Ambulatory Visit (HOSPITAL_COMMUNITY)
Admission: RE | Admit: 2014-02-25 | Discharge: 2014-02-25 | Disposition: A | Discharge status: Home / Self Care | Payer: Managed Care, Other (non HMO) | Source: Ambulatory Visit | Attending: Gastroenterology | Admitting: Gastroenterology

## 2014-02-25 ENCOUNTER — Other Ambulatory Visit (HOSPITAL_COMMUNITY): Payer: Self-pay | Admitting: Gastroenterology

## 2014-02-25 DIAGNOSIS — R188 Other ascites: Secondary | ICD-10-CM

## 2014-02-25 DIAGNOSIS — K7460 Unspecified cirrhosis of liver: Secondary | ICD-10-CM | POA: Insufficient documentation

## 2014-02-25 MED ORDER — ALBUMIN HUMAN 25 % IV SOLN
50.0000 g | Freq: Once | INTRAVENOUS | Status: AC
Start: 2014-02-25 — End: 2014-02-25
  Administered 2014-02-25: 50 g via INTRAVENOUS
  Filled 2014-02-25: qty 200

## 2014-02-25 NOTE — Procedures (Signed)
Successful US guided paracentesis from right mid/lower quadrant.  Yielded 7 liters of yellow fluid.  No immediate complications.  Pt tolerated well.   Specimen was not sent for labs.  Tsosie Billing D PA-C 02/25/2014 1:48 PM

## 2014-02-26 ENCOUNTER — Ambulatory Visit (HOSPITAL_COMMUNITY): Payer: Managed Care, Other (non HMO)

## 2014-04-22 ENCOUNTER — Other Ambulatory Visit (HOSPITAL_COMMUNITY): Payer: Self-pay | Admitting: Gastroenterology

## 2014-04-22 DIAGNOSIS — R188 Other ascites: Secondary | ICD-10-CM

## 2014-04-23 ENCOUNTER — Encounter (HOSPITAL_COMMUNITY): Payer: Self-pay

## 2014-04-23 ENCOUNTER — Ambulatory Visit (HOSPITAL_COMMUNITY)
Admission: RE | Admit: 2014-04-23 | Discharge: 2014-04-23 | Disposition: A | Discharge status: Home / Self Care | Payer: Managed Care, Other (non HMO) | Source: Ambulatory Visit | Attending: Gastroenterology | Admitting: Gastroenterology

## 2014-04-23 DIAGNOSIS — R188 Other ascites: Secondary | ICD-10-CM | POA: Insufficient documentation

## 2014-04-23 MED ORDER — ALBUMIN HUMAN 25 % IV SOLN
50.0000 g | Freq: Once | INTRAVENOUS | Status: AC
  Administered 2014-04-23: 50 g via INTRAVENOUS
  Filled 2014-04-23: qty 200

## 2014-04-23 NOTE — Procedures (Signed)
US guided therapeutic paracentesis performed yielding 7 liters (maximum ordered) yellow fluid. No immediate complications. The pt will receive IV albumin postprocedure.

## 2014-06-05 ENCOUNTER — Other Ambulatory Visit (HOSPITAL_COMMUNITY): Payer: Self-pay | Admitting: Gastroenterology

## 2014-06-05 DIAGNOSIS — R188 Other ascites: Secondary | ICD-10-CM

## 2014-06-06 ENCOUNTER — Emergency Department (HOSPITAL_COMMUNITY)
Admission: EM | Admit: 2014-06-06 | Discharge: 2014-06-06 | Disposition: A | Discharge status: Home / Self Care | Payer: Managed Care, Other (non HMO) | Attending: Emergency Medicine | Admitting: Emergency Medicine

## 2014-06-06 ENCOUNTER — Ambulatory Visit (HOSPITAL_COMMUNITY)
Admission: RE | Admit: 2014-06-06 | Discharge: 2014-06-06 | Disposition: A | Discharge status: Home / Self Care | Payer: Managed Care, Other (non HMO) | Source: Ambulatory Visit | Attending: Gastroenterology | Admitting: Gastroenterology

## 2014-06-06 ENCOUNTER — Encounter (HOSPITAL_COMMUNITY): Payer: Self-pay | Admitting: Emergency Medicine

## 2014-06-06 ENCOUNTER — Emergency Department (HOSPITAL_COMMUNITY): Payer: Managed Care, Other (non HMO)

## 2014-06-06 DIAGNOSIS — K7460 Unspecified cirrhosis of liver: Secondary | ICD-10-CM | POA: Insufficient documentation

## 2014-06-06 DIAGNOSIS — M6281 Muscle weakness (generalized): Secondary | ICD-10-CM | POA: Insufficient documentation

## 2014-06-06 DIAGNOSIS — R1084 Generalized abdominal pain: Secondary | ICD-10-CM

## 2014-06-06 DIAGNOSIS — Z87891 Personal history of nicotine dependence: Secondary | ICD-10-CM | POA: Insufficient documentation

## 2014-06-06 DIAGNOSIS — B182 Chronic viral hepatitis C: Secondary | ICD-10-CM | POA: Insufficient documentation

## 2014-06-06 DIAGNOSIS — Z79899 Other long term (current) drug therapy: Secondary | ICD-10-CM | POA: Insufficient documentation

## 2014-06-06 DIAGNOSIS — Z96659 Presence of unspecified artificial knee joint: Secondary | ICD-10-CM | POA: Insufficient documentation

## 2014-06-06 DIAGNOSIS — R188 Other ascites: Secondary | ICD-10-CM

## 2014-06-06 LAB — GRAM STAIN

## 2014-06-06 LAB — GLUCOSE, PERITONEAL FLUID: GLUCOSE, PERITONEAL FLUID: 100 mg/dL

## 2014-06-06 LAB — CBC WITH DIFFERENTIAL/PLATELET
BASOS PCT: 0 % (ref 0–1)
Basophils Absolute: 0 10*3/uL (ref 0.0–0.1)
EOS ABS: 0 10*3/uL (ref 0.0–0.7)
Eosinophils Relative: 1 % (ref 0–5)
HEMATOCRIT: 32.9 % — AB (ref 39.0–52.0)
HEMOGLOBIN: 11.2 g/dL — AB (ref 13.0–17.0)
Lymphocytes Relative: 14 % (ref 12–46)
Lymphs Abs: 0.4 10*3/uL — ABNORMAL LOW (ref 0.7–4.0)
MCH: 27.9 pg (ref 26.0–34.0)
MCHC: 34 g/dL (ref 30.0–36.0)
MCV: 81.8 fL (ref 78.0–100.0)
MONO ABS: 0.1 10*3/uL (ref 0.1–1.0)
Monocytes Relative: 5 % (ref 3–12)
Neutro Abs: 2.2 10*3/uL (ref 1.7–7.7)
Neutrophils Relative %: 79 % — ABNORMAL HIGH (ref 43–77)
Platelets: DECREASED 10*3/uL (ref 150–400)
RBC: 4.02 MIL/uL — ABNORMAL LOW (ref 4.22–5.81)
RDW: 15.3 % (ref 11.5–15.5)
WBC: 2.7 10*3/uL — ABNORMAL LOW (ref 4.0–10.5)

## 2014-06-06 LAB — BODY FLUID CELL COUNT WITH DIFFERENTIAL
EOS FL: 0 %
Lymphs, Fluid: 45 %
MONOCYTE-MACROPHAGE-SEROUS FLUID: 52 % (ref 50–90)
NEUTROPHIL FLUID: 3 % (ref 0–25)
WBC FLUID: 99 uL (ref 0–1000)

## 2014-06-06 LAB — COMPREHENSIVE METABOLIC PANEL
ALBUMIN: 4.1 g/dL (ref 3.5–5.2)
ALT: 6 U/L (ref 0–53)
ANION GAP: 15 (ref 5–15)
AST: 14 U/L (ref 0–37)
Alkaline Phosphatase: 51 U/L (ref 39–117)
BILIRUBIN TOTAL: 1.5 mg/dL — AB (ref 0.3–1.2)
BUN: 14 mg/dL (ref 6–23)
CALCIUM: 9.5 mg/dL (ref 8.4–10.5)
CHLORIDE: 97 meq/L (ref 96–112)
CO2: 26 mEq/L (ref 19–32)
CREATININE: 0.84 mg/dL (ref 0.50–1.35)
GFR calc Af Amer: >90 mL/min (ref >90)
GFR calc non Af Amer: >90 mL/min (ref >90)
Glucose, Bld: 104 mg/dL — ABNORMAL HIGH (ref 70–99)
Potassium: 3.8 mEq/L (ref 3.7–5.3)
Sodium: 138 mEq/L (ref 137–147)
TOTAL PROTEIN: 8.4 g/dL — AB (ref 6.0–8.3)

## 2014-06-06 LAB — PROTIME-INR
INR: 1.46 (ref 0.00–1.49)
PROTHROMBIN TIME: 17.7 s — AB (ref 11.6–15.2)

## 2014-06-06 LAB — LACTATE DEHYDROGENASE, PLEURAL OR PERITONEAL FLUID: LD FL: 10 U/L (ref 3–23)

## 2014-06-06 LAB — PROTEIN, BODY FLUID: TOTAL PROTEIN, FLUID: 3 g/dL

## 2014-06-06 LAB — ALBUMIN, FLUID (OTHER): Albumin, Fluid: 1.4 g/dL

## 2014-06-06 LAB — LIPASE, BLOOD: LIPASE: 14 U/L (ref 11–59)

## 2014-06-06 MED ORDER — PROCHLORPERAZINE EDISYLATE 5 MG/ML IJ SOLN
10.0000 mg | Freq: Once | INTRAMUSCULAR | Status: AC
  Administered 2014-06-06: 10 mg via INTRAVENOUS
  Filled 2014-06-06: qty 2

## 2014-06-06 MED ORDER — ONDANSETRON HCL 4 MG PO TABS: 4.0000 mg | ORAL_TABLET | Freq: Four times a day (QID) | ORAL | Status: AC

## 2014-06-06 MED ORDER — DIPHENHYDRAMINE HCL 50 MG/ML IJ SOLN
25.0000 mg | Freq: Once | INTRAMUSCULAR | Status: AC
  Administered 2014-06-06: 25 mg via INTRAVENOUS
  Filled 2014-06-06: qty 1

## 2014-06-06 MED ORDER — HYDROMORPHONE HCL PF 1 MG/ML IJ SOLN
1.0000 mg | Freq: Once | INTRAMUSCULAR | Status: AC
  Administered 2014-06-06: 1 mg via INTRAVENOUS
  Filled 2014-06-06: qty 1

## 2014-06-06 MED ORDER — ONDANSETRON HCL 4 MG/2ML IJ SOLN
4.0000 mg | Freq: Once | INTRAMUSCULAR | Status: AC
Start: 2014-06-06 — End: 2014-06-06
  Administered 2014-06-06: 4 mg via INTRAVENOUS
  Filled 2014-06-06: qty 2

## 2014-06-06 MED ORDER — ALBUMIN HUMAN 25 % IV SOLN
50.0000 g | Freq: Once | INTRAVENOUS | Status: AC
  Administered 2014-06-06: 50 g via INTRAVENOUS
  Filled 2014-06-06: qty 200

## 2014-06-06 NOTE — ED Notes (Signed)
Per pt sts N,V and weakness over the past few days. Pt hx of liver failure and had albumin and paracentesis this am. Pt also has had a cough and sts he vomited 8 times.

## 2014-06-06 NOTE — Progress Notes (Addendum)
Pt states he feels terrible and has not been able to keep anything down for last two days.  Pt's wife states she called his Doctor in [place] that follows him for his paracentesis but he has not returned her call yet.  I called Jannifer Franklin, PA for radiology since the patient just had a paracentesis and was sent to short stay for albumin.  Pam suggested if he feels that bad it may be best he be evalutated in the ER after his albumin is finished.  Called report to [place], RN in the ER and she stated to have him come to triage.

## 2014-06-06 NOTE — ED Provider Notes (Signed)
CSN: [id number]     Arrival date & time 06/06/14  1342 History   First MD Initiated Contact with Patient 06/06/14 1506     Chief Complaint  Patient presents with  . Nausea  . Emesis  . Weakness     (Consider location/radiation/quality/duration/timing/severity/associated sxs/prior Treatment) HPI Adrian Compton is a(n) 58 y.o. male who presents to the emergency room with chief complaint of weakness, nausea vomiting and abdominal pain. 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 The patient was at a scheduled paracentesis here at [facility]. 7 L of fluid were removed from his abdomen and he was given 50 g of IV albumin after the procedure. Patient states that he was sent here because he had 3 days of severe nausea, vomiting bilious vomitus, diffuse pain over his entire body along with pain in the bilateral upper quadrants of the abdomen. He denies bloody vomitus. He states he's been unable to hold down any foods or fluids at home. He denies any fevers, chills, myalgias, arthralgias. She endorses some shortness of breath which was relieved with paracentesis. Patient was admitted in February of 2014 for similar symptoms. At that time he was assumed to have a spontaneous bacterial peritonitis.Patient states he has been making normal BMs. No change in color or diarrhea.  Past Medical History  Diagnosis Date  . Hepatitis C   . Cirrhosis of liver due to hepatitis C   . Presence of right artificial knee joint   . Cirrhosis of liver    Past Surgical History  Procedure Laterality Date  . Knee arthroplasty    . Back surgery    . Laminectomy     Family History  Problem Relation Age of Onset  . Breast cancer Mother   . Diabetes type II Mother    History  Substance Use Topics  . Smoking status: Former Research scientist (life sciences)  . Smokeless tobacco: Not on file  . Alcohol Use: No    Review of Systems  Ten systems reviewed and are  negative for acute change, except as noted in the HPI.    Allergies  Nuvigil  Home Medications   Prior to Admission medications   Medication Sig Start Date End Date Taking? Authorizing Provider  albuterol (PROVENTIL HFA;VENTOLIN HFA) 108 (90 BASE) MCG/ACT inhaler Inhale 2 puffs into the lungs every 6 (six) hours as needed for wheezing.   Yes Historical Provider, MD  ALPRAZolam (XANAX) 0.25 MG tablet Take 0.25 mg by mouth 3 (three) times daily as needed for anxiety.   Yes Historical Provider, MD  furosemide (LASIX) 40 MG tablet Take 1 tablet (40 mg total) by mouth daily. 01/30/13  Yes Verlee Monte, MD  HYDROcodone-acetaminophen (NORCO) 10-325 MG per tablet Take 1 tablet by mouth every 4 (four) hours as needed (pain).    Yes Historical Provider, MD  Ledipasvir-Sofosbuvir (HARVONI) 90-400 MG TABS Take 1 tablet by mouth daily.   Yes Historical Provider, MD  oxyCODONE (OXYCONTIN) 10 MG 12 hr tablet Take 10 mg by mouth every 12 (twelve) hours.   Yes Historical Provider, MD  rifaximin (XIFAXAN) 550 MG TABS Take 550 mg by mouth daily.   Yes Historical Provider, MD  sertraline (ZOLOFT) 50 MG tablet Take 50 mg by mouth daily.   Yes Historical Provider, MD   BP 150/80  Pulse 66  Temp(Src) 97.7 F (36.5 C) (Oral)  Resp 22  Ht 6\' 3"  (1.905 m)  Wt 210 lb (95.255  kg)  BMI 26.25 kg/m2  SpO2 98% Physical Exam  Nursing note and vitals reviewed. Constitutional: He appears lethargic. He appears ill.  HENT:  Head: Normocephalic and atraumatic.  Eyes: Conjunctivae are normal. No scleral icterus.  Neck: Normal range of motion. Neck supple.  Cardiovascular: Normal rate, regular rhythm and normal heart sounds.   Pulmonary/Chest: Effort normal and breath sounds normal. No respiratory distress.  Abdominal: Soft. Bowel sounds are normal. He exhibits no distension. There is tenderness.  Diffuse abdominal tenderness.  No heat, redness or warmth. No distnesion. Bandage in place over paracentesis puncture  site. No drainage.  Musculoskeletal: He exhibits no edema.  Neurological: He appears lethargic.  Skin: Skin is warm and dry.  Psychiatric: His behavior is normal.    ED Course  Procedures (including critical care time) Labs Review Labs Reviewed  CBC WITH DIFFERENTIAL - Abnormal; Notable for the following:    WBC 2.7 (*)    RBC 4.02 (*)    Hemoglobin 11.2 (*)    HCT 32.9 (*)    Neutrophils Relative % 79 (*)    Lymphs Abs 0.4 (*)    All other components within normal limits  COMPREHENSIVE METABOLIC PANEL - Abnormal; Notable for the following:    Glucose, Bld 104 (*)    Total Protein 8.4 (*)    Total Bilirubin 1.5 (*)    All other components within normal limits  PROTIME-INR - Abnormal; Notable for the following:    Prothrombin Time 17.7 (*)    All other components within normal limits  LIPASE, BLOOD    Imaging Review US Paracentesis  06/06/2014   CLINICAL DATA:  Ascites  EXAM: ULTRASOUND GUIDED LEFT LOWER QUADRANT PARACENTESIS  COMPARISON:  Previous paracentesis  PROCEDURE: An ultrasound guided paracentesis was thoroughly discussed with the patient and questions answered. The benefits, risks, alternatives and complications were also discussed. The patient understands and wishes to proceed with the procedure. Written consent was obtained.  Ultrasound was performed to localize and mark an adequate pocket of fluid in the left lower quadrant of the abdomen. The area was then prepped and draped in the normal sterile fashion. 1% Lidocaine was used for local anesthesia. Under ultrasound guidance a 19 gauge Yueh catheter was introduced. Paracentesis was performed. The catheter was removed and a dressing applied.  Complications: None.  FINDINGS: A total of approximately 7 Liters of yellow fluid was removed. A fluid sample was not sent for laboratory analysis.  IMPRESSION: Successful ultrasound guided paracentesis yielding 7 Liters of ascites.  Albumin IV 50 g post procedure per MD  Read by: Jannifer Franklin [facility]   Electronically Signed   By: Markus Daft M.D.   On: 06/06/2014 14:00     EKG Interpretation None      MDM   Final diagnoses:  Generalized abdominal pain    4:01 PM BP 157/83  Pulse 67  Temp(Src) 97.7 F (36.5 C) (Oral)  Resp 11  Ht 6\' 3"  (1.905 m)  Wt 210 lb (95.255 kg)  BMI 26.25 kg/m2  SpO2 96% Patient with stavle vs.I have concern for SBP. + leukopenia on cbc, elevated hgb  For the patient and I question hemoconcentration. I have spoken with Monia Sabal, PA-C who will contact us to see if the patient still has fluid samples from the earlier paracentesis to send off for fluid analysis.  7:12 PM Called to check on lab status. Peritoneal samples in process now.   Patient peritoneal fluid samples returned. No organisms. Rare WBCs.  Otherwise unremarkable. I spoke with the patient about findings of labs including leukopenia. His pain is improved and his nausea is resolved. Patient ate a [place] sandwich and holding down fluids. Serial abdominal exams show decreasing/ minimal tenderness at this point. The patient states that he would prefer to go home. I have discussed return precautions with the patient is great detail  The patient expresses understanding and agrees with plan of care.  Margarita Mail, PA-C 06/07/14 1143

## 2014-06-06 NOTE — ED Notes (Signed)
Pt A&OX4, wheeled out of ED via wheelchair, NAD.

## 2014-06-06 NOTE — Procedures (Signed)
  US guided LLQ para 7 liters yellow fluid  Pt tolerated well Post procedure Albumin IV 50 gr per MD

## 2014-06-06 NOTE — Discharge Instructions (Signed)
Abdominal (belly) pain can be caused by many things. Your caregiver performed an examination and possibly ordered blood/urine tests and imaging (CT scan, x-rays, ultrasound). Many cases can be observed and treated at home after initial evaluation in the emergency department. Even though you are being discharged home, abdominal pain can be unpredictable. Therefore, you need a repeated exam if your pain does not resolve, returns, or worsens. Most patients with abdominal pain don't have to be admitted to the hospital or have surgery, but serious problems like appendicitis and gallbladder attacks can start out as nonspecific pain. Many abdominal conditions cannot be diagnosed in one visit, so follow-up evaluations are very important. SEEK IMMEDIATE MEDICAL ATTENTION IF: The pain does not go away or becomes severe.  A temperature above 101 develops.  Repeated vomiting occurs (multiple episodes).  The pain becomes localized to portions of the abdomen. The right side could possibly be appendicitis. In an adult, the left lower portion of the abdomen could be colitis or diverticulitis.  Blood is being passed in stools or vomit (bright red or black tarry stools).  Return also if you develop chest pain, difficulty breathing, dizziness or fainting, or become confused, poorly responsive, or inconsolable (young children).   Abdominal Pain Many things can cause abdominal pain. Usually, abdominal pain is not caused by a disease and will improve without treatment. It can often be observed and treated at home. Your health care provider will do a physical exam and possibly order blood tests and X-rays to help determine the seriousness of your pain. However, in many cases, more time must pass before a clear cause of the pain can be found. Before that point, your health care provider may not know if you need more testing or further treatment. HOME CARE INSTRUCTIONS  Monitor your abdominal pain for any changes. The following  actions may help to alleviate any discomfort you are experiencing:  Only take over-the-counter or prescription medicines as directed by your health care provider.  Do not take laxatives unless directed to do so by your health care provider.  Try a clear liquid diet (broth, tea, or water) as directed by your health care provider. Slowly move to a bland diet as tolerated. SEEK MEDICAL CARE IF:  You have unexplained abdominal pain.  You have abdominal pain associated with nausea or diarrhea.  You have pain when you urinate or have a bowel movement.  You experience abdominal pain that wakes you in the night.  You have abdominal pain that is worsened or improved by eating food.  You have abdominal pain that is worsened with eating fatty foods.  You have a fever. SEEK IMMEDIATE MEDICAL CARE IF:   Your pain does not go away within 2 hours.  You keep throwing up (vomiting).  Your pain is felt only in portions of the abdomen, such as the right side or the left lower portion of the abdomen.  You pass bloody or black tarry stools. MAKE SURE YOU:  Understand these instructions.   Will watch your condition.   Will get help right away if you are not doing well or get worse.  Document Released: 09/01/2005 Document Revised: 11/27/2013 Document Reviewed: 08/01/2013 [facility] Patient Information 2015 [place], [place]. This information is not intended to replace advice given to you by your health care provider. Make sure you discuss any questions you have with your health care provider. Nausea and Vomiting Nausea is a sick feeling that often comes before throwing up (vomiting). Vomiting is a reflex where  stomach contents come out of your mouth. Vomiting can cause severe loss of body fluids (dehydration). Children and elderly adults can become dehydrated quickly, especially if they also have diarrhea. Nausea and vomiting are symptoms of a condition or disease. It is important to find the cause  of your symptoms. CAUSES   Direct irritation of the stomach lining. This irritation can result from increased acid production (gastroesophageal reflux disease), infection, food poisoning, taking certain medicines (such as nonsteroidal anti-inflammatory drugs), alcohol use, or tobacco use.  Signals from the brain.These signals could be caused by a headache, heat exposure, an inner ear disturbance, increased pressure in the brain from injury, infection, a tumor, or a concussion, pain, emotional stimulus, or metabolic problems.  An obstruction in the gastrointestinal tract (bowel obstruction).  Illnesses such as diabetes, hepatitis, gallbladder problems, appendicitis, kidney problems, cancer, sepsis, atypical symptoms of a heart attack, or eating disorders.  Medical treatments such as chemotherapy and radiation.  Receiving medicine that makes you sleep (general anesthetic) during surgery. DIAGNOSIS Your caregiver may ask for tests to be done if the problems do not improve after a few days. Tests may also be done if symptoms are severe or if the reason for the nausea and vomiting is not clear. Tests may include:  Urine tests.  Blood tests.  Stool tests.  Cultures (to look for evidence of infection).  X-rays or other imaging studies. Test results can help your caregiver make decisions about treatment or the need for additional tests. TREATMENT You need to stay well hydrated. Drink frequently but in small amounts.You may wish to drink water, sports drinks, clear broth, or eat frozen ice pops or gelatin dessert to help stay hydrated.When you eat, eating slowly may help prevent nausea.There are also some antinausea medicines that may help prevent nausea. HOME CARE INSTRUCTIONS   Take all medicine as directed by your caregiver.  If you do not have an appetite, do not force yourself to eat. However, you must continue to drink fluids.  If you have an appetite, eat a normal diet unless  your caregiver tells you differently.  Eat a variety of complex carbohydrates (rice, wheat, potatoes, bread), lean meats, yogurt, fruits, and vegetables.  Avoid high-fat foods because they are more difficult to digest.  Drink enough water and fluids to keep your urine clear or pale yellow.  If you are dehydrated, ask your caregiver for specific rehydration instructions. Signs of dehydration may include:  Severe thirst.  Dry lips and mouth.  Dizziness.  Dark urine.  Decreasing urine frequency and amount.  Confusion.  Rapid breathing or pulse. SEEK IMMEDIATE MEDICAL CARE IF:   You have blood or brown flecks (like coffee grounds) in your vomit.  You have black or bloody stools.  You have a severe headache or stiff neck.  You are confused.  You have severe abdominal pain.  You have chest pain or trouble breathing.  You do not urinate at least once every 8 hours.  You develop cold or clammy skin.  You continue to vomit for longer than 24 to 48 hours.  You have a fever. MAKE SURE YOU:   Understand these instructions.  Will watch your condition.  Will get help right away if you are not doing well or get worse. Document Released: 11/22/2005 Document Revised: 02/14/2012 Document Reviewed: 04/21/2011 [facility] Patient Information 2015 [place], [place]. This information is not intended to replace advice given to you by your health care provider. Make sure you discuss any questions you have  with your health care provider. ° °

## 2014-06-06 NOTE — ED Notes (Signed)
Pt transported to radiology.

## 2014-06-09 LAB — BODY FLUID CULTURE: CULTURE: NO GROWTH

## 2014-06-10 LAB — PATHOLOGIST SMEAR REVIEW

## 2014-06-15 NOTE — ED Provider Notes (Signed)
Medical screening examination/treatment/procedure(s) were performed by non-physician practitioner and as supervising physician I was immediately available for consultation/collaboration.   EKG Interpretation None        Malvin Johns, MD 06/15/14 [phone]

## 2014-06-18 ENCOUNTER — Emergency Department (HOSPITAL_COMMUNITY)
Admission: EM | Admit: 2014-06-18 | Discharge: 2014-06-18 | Disposition: A | Discharge status: Home / Self Care | Payer: Managed Care, Other (non HMO) | Attending: Emergency Medicine | Admitting: Emergency Medicine

## 2014-06-18 ENCOUNTER — Encounter (HOSPITAL_COMMUNITY): Payer: Self-pay | Admitting: Emergency Medicine

## 2014-06-18 ENCOUNTER — Emergency Department (HOSPITAL_COMMUNITY): Payer: Managed Care, Other (non HMO)

## 2014-06-18 DIAGNOSIS — Z792 Long term (current) use of antibiotics: Secondary | ICD-10-CM | POA: Insufficient documentation

## 2014-06-18 DIAGNOSIS — R1084 Generalized abdominal pain: Secondary | ICD-10-CM | POA: Insufficient documentation

## 2014-06-18 DIAGNOSIS — Z79899 Other long term (current) drug therapy: Secondary | ICD-10-CM | POA: Insufficient documentation

## 2014-06-18 DIAGNOSIS — Z8719 Personal history of other diseases of the digestive system: Secondary | ICD-10-CM | POA: Insufficient documentation

## 2014-06-18 DIAGNOSIS — Z8619 Personal history of other infectious and parasitic diseases: Secondary | ICD-10-CM | POA: Insufficient documentation

## 2014-06-18 DIAGNOSIS — Z96659 Presence of unspecified artificial knee joint: Secondary | ICD-10-CM | POA: Insufficient documentation

## 2014-06-18 DIAGNOSIS — R112 Nausea with vomiting, unspecified: Secondary | ICD-10-CM | POA: Insufficient documentation

## 2014-06-18 DIAGNOSIS — R197 Diarrhea, unspecified: Secondary | ICD-10-CM | POA: Insufficient documentation

## 2014-06-18 DIAGNOSIS — Z87891 Personal history of nicotine dependence: Secondary | ICD-10-CM | POA: Insufficient documentation

## 2014-06-18 DIAGNOSIS — R188 Other ascites: Secondary | ICD-10-CM | POA: Insufficient documentation

## 2014-06-18 LAB — CBC WITH DIFFERENTIAL/PLATELET
BASOS PCT: 0 % (ref 0–1)
Basophils Absolute: 0 10*3/uL (ref 0.0–0.1)
Eosinophils Absolute: 0.1 10*3/uL (ref 0.0–0.7)
Eosinophils Relative: 2 % (ref 0–5)
HCT: 38.5 % — ABNORMAL LOW (ref 39.0–52.0)
HEMOGLOBIN: 13 g/dL (ref 13.0–17.0)
Lymphocytes Relative: 13 % (ref 12–46)
Lymphs Abs: 0.6 10*3/uL — ABNORMAL LOW (ref 0.7–4.0)
MCH: 28.6 pg (ref 26.0–34.0)
MCHC: 33.8 g/dL (ref 30.0–36.0)
MCV: 84.8 fL (ref 78.0–100.0)
MONOS PCT: 7 % (ref 3–12)
Monocytes Absolute: 0.3 10*3/uL (ref 0.1–1.0)
NEUTROS PCT: 78 % — AB (ref 43–77)
Neutro Abs: 3.6 10*3/uL (ref 1.7–7.7)
Platelets: 50 10*3/uL — ABNORMAL LOW (ref 150–400)
RBC: 4.54 MIL/uL (ref 4.22–5.81)
RDW: 15.9 % — ABNORMAL HIGH (ref 11.5–15.5)
WBC: 4.7 10*3/uL (ref 4.0–10.5)

## 2014-06-18 LAB — BODY FLUID CELL COUNT WITH DIFFERENTIAL
LYMPHS FL: 37 %
Monocyte-Macrophage-Serous Fluid: 63 % (ref 50–90)
WBC FLUID: 206 uL (ref 0–1000)

## 2014-06-18 LAB — PROTEIN, BODY FLUID: Total protein, fluid: 2.9 g/dL

## 2014-06-18 LAB — URINALYSIS, ROUTINE W REFLEX MICROSCOPIC
Glucose, UA: NEGATIVE mg/dL
HGB URINE DIPSTICK: NEGATIVE
Ketones, ur: NEGATIVE mg/dL
Leukocytes, UA: NEGATIVE
Nitrite: NEGATIVE
PH: 5.5 (ref 5.0–8.0)
Protein, ur: NEGATIVE mg/dL
SPECIFIC GRAVITY, URINE: 1.03 (ref 1.005–1.030)
Urobilinogen, UA: 0.2 mg/dL (ref 0.0–1.0)

## 2014-06-18 LAB — GRAM STAIN

## 2014-06-18 LAB — COMPREHENSIVE METABOLIC PANEL
ALBUMIN: 3.6 g/dL (ref 3.5–5.2)
ALT: 9 U/L (ref 0–53)
ANION GAP: 14 (ref 5–15)
AST: 15 U/L (ref 0–37)
Alkaline Phosphatase: 58 U/L (ref 39–117)
BILIRUBIN TOTAL: 1.4 mg/dL — AB (ref 0.3–1.2)
BUN: 18 mg/dL (ref 6–23)
CO2: 22 mEq/L (ref 19–32)
CREATININE: 0.96 mg/dL (ref 0.50–1.35)
Calcium: 9.3 mg/dL (ref 8.4–10.5)
Chloride: 101 mEq/L (ref 96–112)
GFR calc Af Amer: >90 mL/min (ref >90)
GFR calc non Af Amer: >90 mL/min (ref >90)
Glucose, Bld: 108 mg/dL — ABNORMAL HIGH (ref 70–99)
Potassium: 4 mEq/L (ref 3.7–5.3)
Sodium: 137 mEq/L (ref 137–147)
TOTAL PROTEIN: 8.3 g/dL (ref 6.0–8.3)

## 2014-06-18 LAB — PROTIME-INR
INR: 1.52 — ABNORMAL HIGH (ref 0.00–1.49)
PROTHROMBIN TIME: 18.3 s — AB (ref 11.6–15.2)

## 2014-06-18 LAB — GLUCOSE, PERITONEAL FLUID

## 2014-06-18 LAB — LIPASE, BLOOD: Lipase: 92 U/L — ABNORMAL HIGH (ref 11–59)

## 2014-06-18 MED ORDER — HYDROMORPHONE HCL PF 1 MG/ML IJ SOLN
2.0000 mg | Freq: Once | INTRAMUSCULAR | Status: AC
  Administered 2014-06-18: 2 mg via INTRAVENOUS
  Filled 2014-06-18: qty 2

## 2014-06-18 MED ORDER — ONDANSETRON HCL 4 MG/2ML IJ SOLN
4.0000 mg | Freq: Once | INTRAMUSCULAR | Status: AC
  Administered 2014-06-18: 4 mg via INTRAVENOUS
  Filled 2014-06-18: qty 2

## 2014-06-18 MED ORDER — OXYCODONE HCL ER 80 MG PO T12A
80.0000 mg | EXTENDED_RELEASE_TABLET | Freq: Once | ORAL | Status: AC
  Administered 2014-06-18: 80 mg via ORAL

## 2014-06-18 MED ORDER — OXYCODONE HCL ER 10 MG PO T12A: 80.0000 mg | EXTENDED_RELEASE_TABLET | Freq: Once | ORAL | Status: AC

## 2014-06-18 NOTE — ED Notes (Signed)
Pt returned to room from ultrasound, no distress noted.

## 2014-06-18 NOTE — ED Notes (Signed)
MD at bedside. 

## 2014-06-18 NOTE — Discharge Instructions (Signed)
Abdominal Pain °Many things can cause abdominal pain. Usually, abdominal pain is not caused by a disease and will improve without treatment. It can often be observed and treated at home. Your health care provider will do a physical exam and possibly order blood tests and X-rays to help determine the seriousness of your pain. However, in many cases, more time must pass before a clear cause of the pain can be found. Before that point, your health care provider may not know if you need more testing or further treatment. °HOME CARE INSTRUCTIONS  °Monitor your abdominal pain for any changes. The following actions may help to alleviate any discomfort you are experiencing: °· Only take over-the-counter or prescription medicines as directed by your health care provider. °· Do not take laxatives unless directed to do so by your health care provider. °· Try a clear liquid diet (broth, tea, or water) as directed by your health care provider. Slowly move to a bland diet as tolerated. °SEEK MEDICAL CARE IF: °· You have unexplained abdominal pain. °· You have abdominal pain associated with nausea or diarrhea. °· You have pain when you urinate or have a bowel movement. °· You experience abdominal pain that wakes you in the night. °· You have abdominal pain that is worsened or improved by eating food. °· You have abdominal pain that is worsened with eating fatty foods. °· You have a fever. °SEEK IMMEDIATE MEDICAL CARE IF:  °· Your pain does not go away within 2 hours. °· You keep throwing up (vomiting). °· Your pain is felt only in portions of the abdomen, such as the right side or the left lower portion of the abdomen. °· You pass bloody or black tarry stools. °MAKE SURE YOU: °· Understand these instructions.   °· Will watch your condition.   °· Will get help right away if you are not doing well or get worse.   °Document Released: 09/01/2005 Document Revised: 11/27/2013 Document Reviewed: 08/01/2013 °[employer]® Patient Information  ©2015 [employer]. This information is not intended to replace advice given to you by your health care provider. Make sure you discuss any questions you have with your health care provider. ° ° ° ° °Ascites °Ascites is a gathering of fluid in the belly (abdomen). This is most often caused by liver disease. It may also be caused by a number of other less common problems. It causes a ballooning out (distension) of the abdomen. °CAUSES  °Scarring of the liver (cirrhosis) is the most common cause of ascites. Other causes include: °· Infection or inflammation in the abdomen. °· Cancer in the abdomen. °· Heart failure. °· Certain forms of kidney failure (nephritic syndrome). °· Inflammation of the pancreas. °· Clots in the veins of the liver. °SYMPTOMS  °In the early stages of ascites, you may not have any symptoms. The main symptom of ascites is a sense of abdominal bloating. This is due to the presence of fluid. This may also cause an increase in abdominal or waist size. People with this condition can develop swelling in the legs, and men can develop a swollen scrotum. When there is a lot of fluid, it may be hard to breath. Stretching of the abdomen by fluid can be painful. °DIAGNOSIS  °Certain features of your medical history, such as a history of liver disease and of an enlarging abdomen, can suggest the presence of ascites. The diagnosis of ascites can be made on physical exam by your caregiver. An abdominal ultrasound examination can confirm that ascites is present,   and estimate the amount of fluid. °Once ascites is confirmed, it is important to determine its cause. Again, a history of one of the conditions listed in "CAUSES" provides a strong clue. A physical exam is important, and blood and X-ray tests may be needed. During a procedure called paracentesis, a sample of fluid is removed from the abdomen. This can determine certain key features about the fluid, such as whether or not infection or cancer is present.  Your caregiver will determine if a paracentesis is necessary. They will describe the procedure to you. °PREVENTION  °Ascites is a complication of other conditions. Therefore to prevent ascites, you must seek treatment for any significant health conditions you have. Once ascites is present, careful attention to fluid and salt intake may help prevent it from getting worse. If you have ascites, you should not drink alcohol. °PROGNOSIS  °The prognosis of ascites depends on the underlying disease. If the disease is reversible, such as with certain infections or with heart failure, then ascites may improve or disappear. When ascites is caused by cirrhosis, then it indicates that the liver disease has worsened, and further evaluation and treatment of the liver disease is needed. If your ascites is caused by cancer, then the success or failure of the cancer treatment will determine whether your ascites will improve or worsen. °RISKS AND COMPLICATIONS  °Ascites is likely to worsen if it is not properly diagnosed and treated. A large amount of ascites can cause pain and difficulty breathing. The main complication, besides worsening, is infection (called spontaneous bacterial peritonitis). This requires prompt treatment. °TREATMENT  °The treatment of ascites depends on its cause. When liver disease is your cause, medical management using water pills (diuretics) and decreasing salt intake is often effective. Ascites due to peritoneal inflammation or malignancy (cancer) alone does not respond to salt restriction and diuretics. Hospitalization is sometimes required. °If the treatment of ascites cannot be managed with medications, a number of other treatments are available. Your caregivers will help you decide which will work best for you. Some of these are: °· Removal of fluid from the abdomen (paracentesis). °· Fluid from the abdomen is passed into a vein (peritoneovenous shunting). °· Liver transplantation. °· Transjugular  intrahepatic portosystemic stent shunt. °HOME CARE INSTRUCTIONS  °It is important to monitor body weight and the intake and output of fluids. Weigh yourself at the same time every day. Record your weights. Fluid restriction may be necessary. It is also important to know your salt intake. The more salt you take in, the more fluid you will retain. Ninety percent of people with ascites respond to this approach. °· Follow any directions for medicines carefully. °· Follow up with your caregiver, as directed. °· Report any changes in your health, especially any new or worsening symptoms. °· If your ascites is from liver disease, avoid alcohol and other substances toxic to the liver. °SEEK MEDICAL CARE IF:  °· Your weight increases more than a few pounds in a few days. °· Your abdominal or waist size increases. °· You develop swelling in your legs. °· You had swelling and it worsens. °SEEK IMMEDIATE MEDICAL CARE IF:  °· You develop a fever. °· You develop new abdominal pain. °· You develop difficulty breathing. °· You develop confusion. °· You have bleeding from the mouth, stomach, or rectum. °MAKE SURE YOU:  °· Understand these instructions. °· Will watch your condition. °· Will get help right away if you are not doing well or get worse. °Document Released: 11/22/2005   Document Revised: 02/14/2012 Document Reviewed: 06/23/2007 °[employer]® Patient Information ©2015 [employer]. This information is not intended to replace advice given to you by your health care provider. Make sure you discuss any questions you have with your health care provider. ° °

## 2014-06-18 NOTE — ED Provider Notes (Signed)
CSN: [id number]     Arrival date & time 06/18/14  0944 History   First MD Initiated Contact with Patient 06/18/14 [phone]     Chief Complaint  Patient presents with  . Abdominal Pain     (Consider location/radiation/quality/duration/timing/severity/associated sxs/prior Treatment) HPI 58 year old male with a history of cirrhosis and ascites presents with abdominal pain, distention, nausea, vomiting, diarrhea. He denies any fevers. He states the pain seems related to his ascites visible but worse than typical. He did not take his 80 mg 12 are OxyContin this morning. He rates the pain as a severe pain is mostly right-sided. He normally gets a paracentesis every 2 weeks. He last had his paracentesis last week and now feels like his abdomen is distended to the point of it fitted in 3 weeks.  Past Medical History  Diagnosis Date  . Hepatitis C   . Cirrhosis of liver due to hepatitis C   . Presence of right artificial knee joint   . Cirrhosis of liver    Past Surgical History  Procedure Laterality Date  . Knee arthroplasty    . Back surgery    . Laminectomy     Family History  Problem Relation Age of Onset  . Breast cancer Mother   . Diabetes type II Mother    History  Substance Use Topics  . Smoking status: Former Research scientist (life sciences)  . Smokeless tobacco: Not on file  . Alcohol Use: No    Review of Systems  Constitutional: Negative for fever.  Gastrointestinal: Positive for nausea, vomiting, abdominal pain, diarrhea and abdominal distention.  All other systems reviewed and are negative.     Allergies  Nuvigil  Home Medications   Prior to Admission medications   Medication Sig Start Date End Date Taking? Authorizing Provider  albuterol (PROVENTIL HFA;VENTOLIN HFA) 108 (90 BASE) MCG/ACT inhaler Inhale 2 puffs into the lungs every 6 (six) hours as needed for wheezing.    Historical Provider, MD  ALPRAZolam Duanne Moron) 0.25 MG tablet Take 0.25 mg by mouth 3 (three) times daily as needed for  anxiety.    Historical Provider, MD  furosemide (LASIX) 40 MG tablet Take 1 tablet (40 mg total) by mouth daily. 01/30/13   Verlee Monte, MD  HYDROcodone-acetaminophen (NORCO) 10-325 MG per tablet Take 1 tablet by mouth every 4 (four) hours as needed (pain).     Historical Provider, MD  Ledipasvir-Sofosbuvir (HARVONI) 90-400 MG TABS Take 1 tablet by mouth daily.    Historical Provider, MD  ondansetron (ZOFRAN) 4 MG tablet Take 1 tablet (4 mg total) by mouth every 6 (six) hours. 06/06/14   Margarita Mail, PA-C  oxyCODONE (OXYCONTIN) 10 MG 12 hr tablet Take 10 mg by mouth every 12 (twelve) hours.    Historical Provider, MD  rifaximin (XIFAXAN) 550 MG TABS Take 550 mg by mouth daily.    Historical Provider, MD  sertraline (ZOLOFT) 50 MG tablet Take 50 mg by mouth daily.    Historical Provider, MD   BP 131/81  Pulse 80  Temp(Src) 98.5 F (36.9 C) (Oral)  Resp 20  SpO2 100% Physical Exam  Nursing note and vitals reviewed. Constitutional: He is oriented to person, place, and time. He appears well-developed and well-nourished. No distress.  HENT:  Head: Normocephalic and atraumatic.  Right Ear: External ear normal.  Left Ear: External ear normal.  Nose: Nose normal.  Eyes: Right eye exhibits no discharge. Left eye exhibits no discharge.  Neck: Neck supple.  Cardiovascular: Normal rate, regular rhythm, normal  heart sounds and intact distal pulses.   Pulmonary/Chest: Effort normal.  Abdominal: Soft. He exhibits distension. There is tenderness (diffuse, mostly right sided).  Musculoskeletal: He exhibits no edema.  Neurological: He is alert and oriented to person, place, and time.  Skin: Skin is warm and dry.    ED Course  Procedures (including critical care time) Labs Review Labs Reviewed  COMPREHENSIVE METABOLIC PANEL - Abnormal; Notable for the following:    Glucose, Bld 108 (*)    Total Bilirubin 1.4 (*)    All other components within normal limits  LIPASE, BLOOD - Abnormal; Notable  for the following:    Lipase 92 (*)    All other components within normal limits  CBC WITH DIFFERENTIAL - Abnormal; Notable for the following:    HCT 38.5 (*)    RDW 15.9 (*)    Platelets 50 (*)    Neutrophils Relative % 78 (*)    Lymphs Abs 0.6 (*)    All other components within normal limits  BODY FLUID CELL COUNT WITH DIFFERENTIAL - Abnormal; Notable for the following:    Color, Fluid YELLOW (*)    Appearance, Fluid HAZY (*)    All other components within normal limits  URINALYSIS, ROUTINE W REFLEX MICROSCOPIC - Abnormal; Notable for the following:    Color, Urine AMBER (*)    Bilirubin Urine SMALL (*)    All other components within normal limits  PROTIME-INR - Abnormal; Notable for the following:    Prothrombin Time 18.3 (*)    INR 1.52 (*)    All other components within normal limits  GRAM STAIN  BODY FLUID CULTURE  GLUCOSE, PERITONEAL FLUID  PROTEIN, BODY FLUID    Imaging Review No results found.   EKG Interpretation None      MDM   Final diagnoses:  Generalized abdominal pain  Ascites    I believe the patient's pain is coming from his ascites causing discomfort. Patient's abdominal pain significantly improved with paracentesis by radiology. He has less than 250 PMNs and thus is very unlikely to have SBP as the cause of his ascites. He feels well enough for discharge. There's been no vomiting in the ED. He has enough nausea medicine at home would like discharge and will follow up with his PCP.    Ephraim Hamburger, MD 06/18/14 [phone]

## 2014-06-18 NOTE — ED Notes (Signed)
Pt in c/o abdominal distention, states he has a history of ascites and last had paracentesis last week, went to PMD this morning and was told to come to ED for same again. Complains of chronic abd pain, states he has not had normal medication this am, usually takes 80mg  extended release oxycodone for pain control. Pt alert and oriented.

## 2014-06-18 NOTE — Procedures (Signed)
Successful US guided paracentesis from RLQ.  Yielded 3.6L of clear yellow fluid.  No immediate complications.  Pt tolerated well.   Specimen was sent for labs.  Ascencion Dike PA-C 06/18/2014 2:20 PM

## 2014-06-19 LAB — PATHOLOGIST SMEAR REVIEW: Path Review: REACTIVE

## 2014-06-21 LAB — BODY FLUID CULTURE: Culture: NO GROWTH

## 2014-07-16 ENCOUNTER — Other Ambulatory Visit (HOSPITAL_COMMUNITY): Payer: Self-pay | Admitting: Gastroenterology

## 2014-07-16 DIAGNOSIS — R188 Other ascites: Secondary | ICD-10-CM

## 2014-07-18 ENCOUNTER — Other Ambulatory Visit (HOSPITAL_COMMUNITY): Payer: Self-pay | Admitting: Gastroenterology

## 2014-07-18 ENCOUNTER — Ambulatory Visit (HOSPITAL_COMMUNITY)
Admission: RE | Admit: 2014-07-18 | Discharge: 2014-07-18 | Disposition: A | Discharge status: Home / Self Care | Payer: Managed Care, Other (non HMO) | Source: Ambulatory Visit | Attending: Gastroenterology | Admitting: Gastroenterology

## 2014-07-18 DIAGNOSIS — R188 Other ascites: Secondary | ICD-10-CM

## 2014-07-18 MED ORDER — LIDOCAINE HCL (PF) 1 % IJ SOLN
INTRAMUSCULAR | Status: AC
  Filled 2014-07-18: qty 10

## 2014-09-19 IMAGING — US US ABDOMEN LIMITED
1 series · 4 of 4 positions shown · non-contrast
Comparison: None.

CLINICAL DATA: Ascites

EXAM:
US ABDOMEN LIMITED - RIGHT UPPER QUADRANT

[Series 1: us abdomen limited · 0.27mm/px · 4 of 4 slices shown]
[im 1/4]
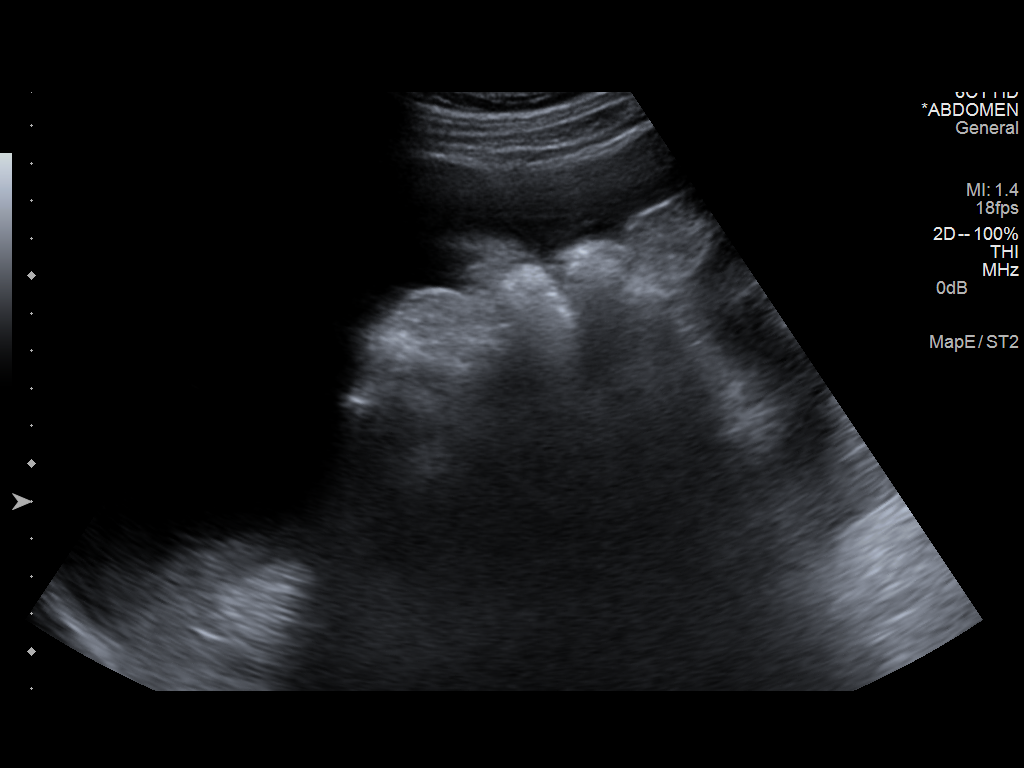
[im 2/4]
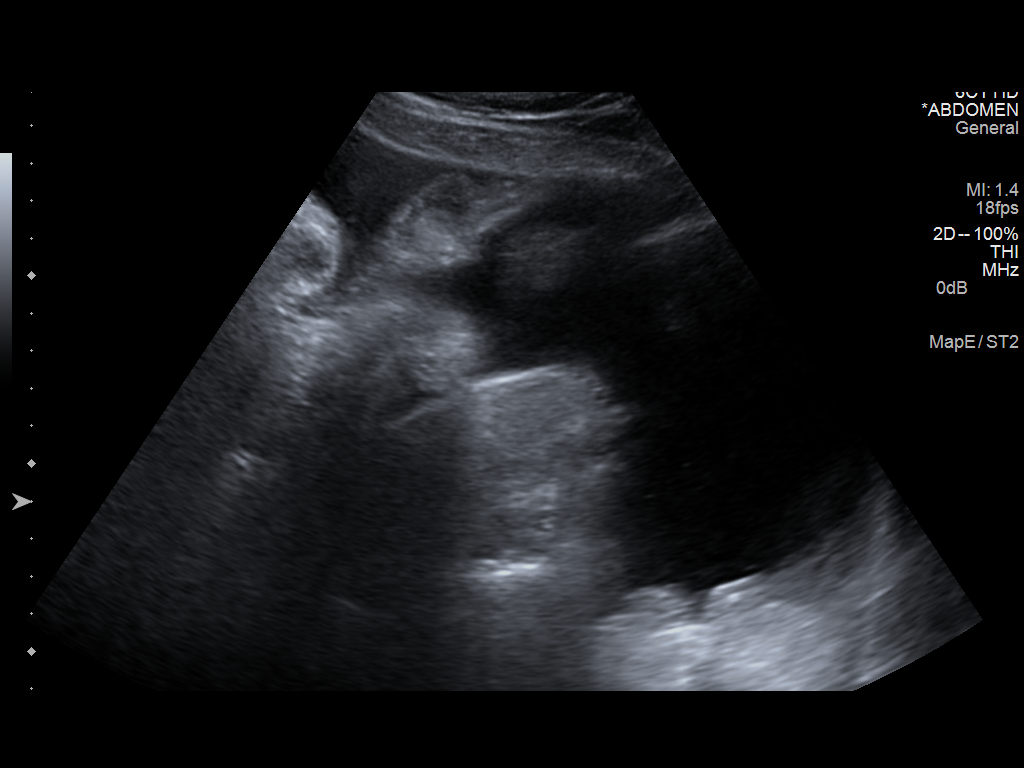
[im 3/4]
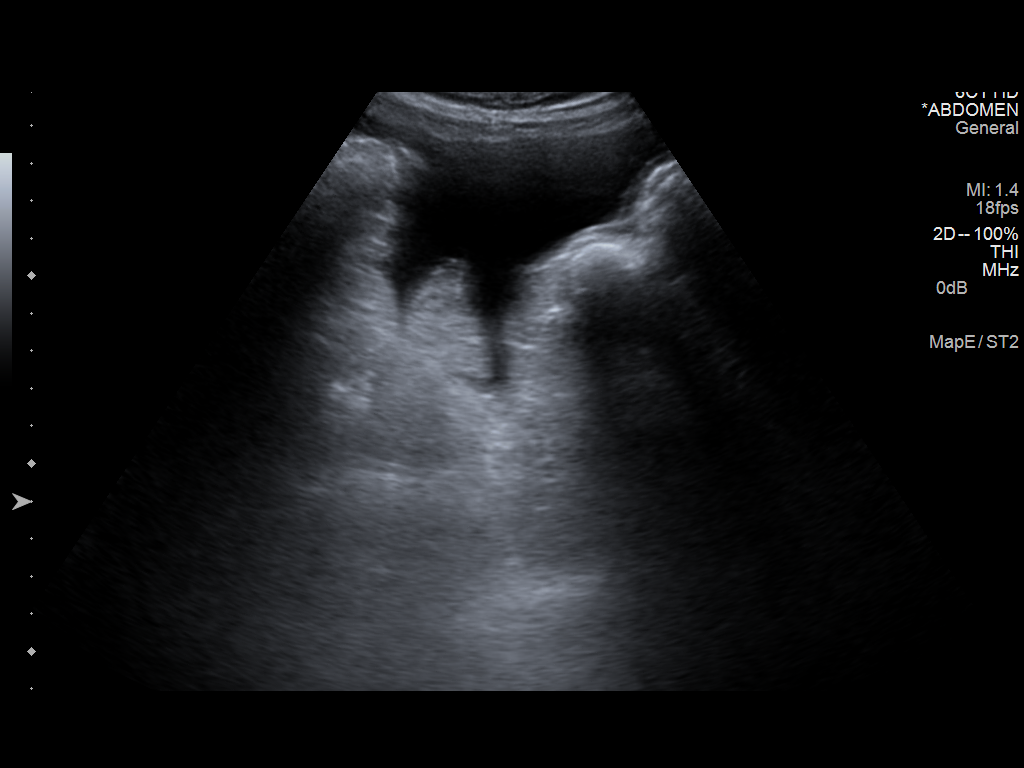
[im 4/4]
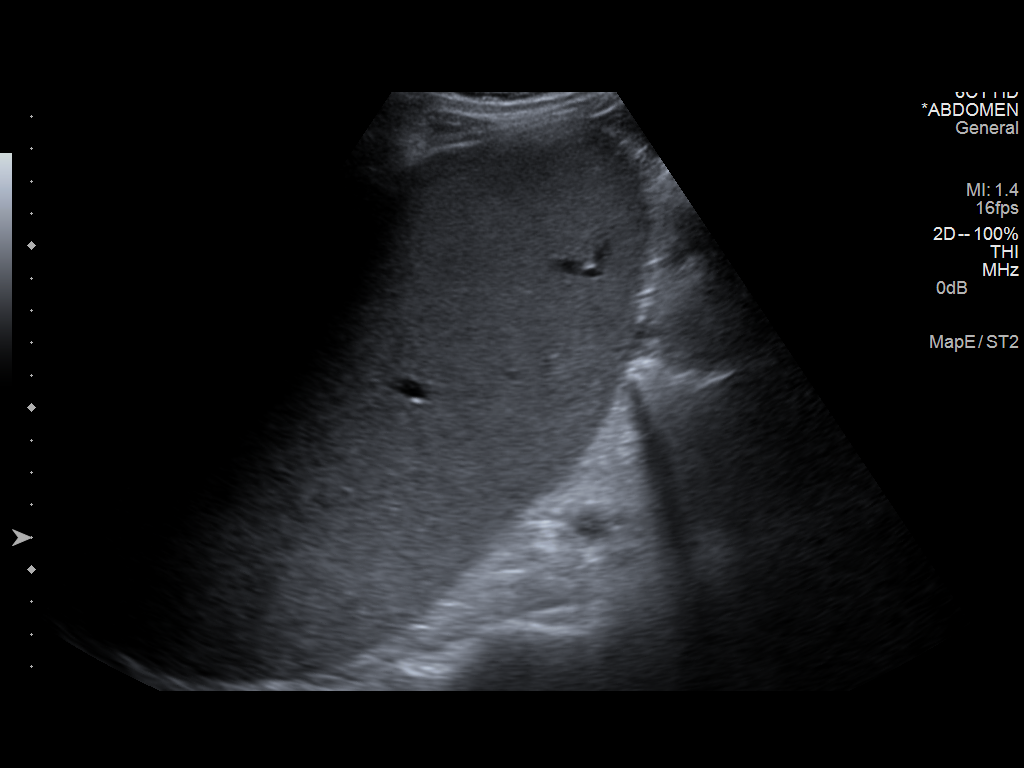

[4 of 4 positions shown; findings below may reference images not displayed]

FINDINGS: Evaluation of the upper abdomen was performed by ultrasound. There
is a moderate volume ascites. Patient wished to postpone
paracentesis until volume of ascites greater.
IMPRESSION: Moderate volume ascites.

## 2014-09-30 ENCOUNTER — Other Ambulatory Visit (HOSPITAL_COMMUNITY): Payer: Self-pay | Admitting: Gastroenterology

## 2014-09-30 DIAGNOSIS — K7460 Unspecified cirrhosis of liver: Secondary | ICD-10-CM

## 2014-10-04 ENCOUNTER — Ambulatory Visit (HOSPITAL_COMMUNITY)
Admission: RE | Admit: 2014-10-04 | Discharge: 2014-10-04 | Disposition: A | Discharge status: Home / Self Care | Payer: Medicare Other | Source: Ambulatory Visit | Attending: Interventional Radiology | Admitting: Interventional Radiology

## 2014-10-04 VITALS — BP 128/72 | HR 59 | Temp 98.0°F | Resp 20

## 2014-10-04 DIAGNOSIS — K7460 Unspecified cirrhosis of liver: Secondary | ICD-10-CM | POA: Insufficient documentation

## 2014-10-04 DIAGNOSIS — R188 Other ascites: Secondary | ICD-10-CM | POA: Diagnosis present

## 2014-10-04 MED ORDER — ALBUMIN HUMAN 25 % IV SOLN
50.0000 g | Freq: Once | INTRAVENOUS | Status: AC
  Administered 2014-10-04: 50 g via INTRAVENOUS
  Filled 2014-10-04: qty 200

## 2014-10-04 MED ORDER — LIDOCAINE HCL (PF) 1 % IJ SOLN
INTRAMUSCULAR | Status: AC
  Filled 2014-10-04: qty 10

## 2014-10-04 NOTE — Procedures (Signed)
Successful large vol paracentesis with [place] No comp Stable Full report in pacs

## 2014-10-14 ENCOUNTER — Other Ambulatory Visit (HOSPITAL_COMMUNITY): Payer: Self-pay | Admitting: Gastroenterology

## 2014-10-22 ENCOUNTER — Other Ambulatory Visit (HOSPITAL_COMMUNITY): Payer: Self-pay | Admitting: Gastroenterology

## 2014-10-22 DIAGNOSIS — B182 Chronic viral hepatitis C: Secondary | ICD-10-CM

## 2014-10-22 DIAGNOSIS — R188 Other ascites: Secondary | ICD-10-CM

## 2014-10-22 DIAGNOSIS — K7460 Unspecified cirrhosis of liver: Secondary | ICD-10-CM

## 2014-10-29 ENCOUNTER — Ambulatory Visit (HOSPITAL_COMMUNITY)
Admission: RE | Admit: 2014-10-29 | Discharge: 2014-10-29 | Disposition: A | Discharge status: Home / Self Care | Payer: Medicare Other | Source: Ambulatory Visit | Attending: Gastroenterology | Admitting: Gastroenterology

## 2014-10-29 DIAGNOSIS — B182 Chronic viral hepatitis C: Secondary | ICD-10-CM

## 2014-10-29 DIAGNOSIS — K7460 Unspecified cirrhosis of liver: Secondary | ICD-10-CM

## 2014-10-29 DIAGNOSIS — R188 Other ascites: Secondary | ICD-10-CM | POA: Insufficient documentation

## 2014-10-29 MED ORDER — LIDOCAINE HCL (PF) 1 % IJ SOLN
INTRAMUSCULAR | Status: AC
  Filled 2014-10-29: qty 10

## 2014-10-29 MED ORDER — ALBUMIN HUMAN 25 % IV SOLN
50.0000 g | Freq: Once | INTRAVENOUS | Status: AC
  Administered 2014-10-29: 50 g via INTRAVENOUS
  Filled 2014-10-29: qty 200

## 2014-12-11 ENCOUNTER — Other Ambulatory Visit (HOSPITAL_COMMUNITY): Payer: Self-pay | Admitting: Internal Medicine

## 2014-12-11 DIAGNOSIS — R188 Other ascites: Secondary | ICD-10-CM

## 2014-12-13 ENCOUNTER — Encounter (HOSPITAL_COMMUNITY)
Admission: RE | Admit: 2014-12-13 | Discharge: 2014-12-13 | Disposition: A | Discharge status: Home / Self Care | Payer: Managed Care, Other (non HMO) | Source: Ambulatory Visit | Attending: Internal Medicine | Admitting: Internal Medicine

## 2014-12-13 ENCOUNTER — Ambulatory Visit (HOSPITAL_COMMUNITY)
Admission: RE | Admit: 2014-12-13 | Discharge: 2014-12-13 | Disposition: A | Discharge status: Home / Self Care | Payer: Medicare Other | Source: Ambulatory Visit | Attending: Internal Medicine | Admitting: Internal Medicine

## 2014-12-13 DIAGNOSIS — R188 Other ascites: Secondary | ICD-10-CM | POA: Insufficient documentation

## 2014-12-13 DIAGNOSIS — B1920 Unspecified viral hepatitis C without hepatic coma: Secondary | ICD-10-CM | POA: Diagnosis not present

## 2014-12-13 DIAGNOSIS — K7460 Unspecified cirrhosis of liver: Secondary | ICD-10-CM | POA: Diagnosis not present

## 2014-12-13 LAB — BODY FLUID CELL COUNT WITH DIFFERENTIAL
Eos, Fluid: 0 %
Lymphs, Fluid: 37 %
MONOCYTE-MACROPHAGE-SEROUS FLUID: 59 % (ref 50–90)
Neutrophil Count, Fluid: 4 % (ref 0–25)
Total Nucleated Cell Count, Fluid: 113 cu mm (ref 0–1000)

## 2014-12-13 MED ORDER — SODIUM CHLORIDE 0.9 % IV SOLN
INTRAVENOUS | Status: DC
  Administered 2014-12-13: 250 mL via INTRAVENOUS

## 2014-12-13 MED ORDER — ALBUMIN HUMAN 25 % IV SOLN
25.0000 g | Freq: Once | INTRAVENOUS | Status: AC
  Administered 2014-12-13: 25 g via INTRAVENOUS
  Filled 2014-12-13: qty 100

## 2014-12-13 NOTE — Discharge Instructions (Signed)
Albumin injection °What is this medicine? °ALBUMIN (al BYOO min) is used to treat or prevent shock following serious injury, bleeding, surgery, or burns by increasing the volume of blood plasma. This medicine can also replace low blood protein. °This medicine may be used for other purposes; ask your health care provider or pharmacist if you have questions. °COMMON BRAND NAME(S): Albuked, Albumarc, Albuminar, Albutein, Buminate, Flexbumin, Kedbumin, Macrotec, Plasbumin °What should I tell my health care provider before I take this medicine? °They need to know if you have any of the following conditions: °-anemia °-heart disease °-kidney disease °-an unusual or allergic reaction to albumin, other medicines, foods, dyes, or preservatives °-pregnant or trying to get pregnant °-breast-feeding °How should I use this medicine? °This medicine is for infusion into a vein. It is given by a health-care professional in a hospital or clinic. °Talk to your pediatrician regarding the use of this medicine in children. While this drug may be prescribed for selected conditions, precautions do apply. °Overdosage: If you think you have taken too much of this medicine contact a poison control center or emergency room at once. °NOTE: This medicine is only for you. Do not share this medicine with others. °What if I miss a dose? °This does not apply. °What may interact with this medicine? °Interactions are not expected. °This list may not describe all possible interactions. Give your health care provider a list of all the medicines, herbs, non-prescription drugs, or dietary supplements you use. Also tell them if you smoke, drink alcohol, or use illegal drugs. Some items may interact with your medicine. °What should I watch for while using this medicine? °Your condition will be closely monitored while you receive this medicine. °Some products are derived from human plasma, and there is a small risk that these products may contain certain  types of virus or bacteria. All products are processed to kill most viruses and bacteria. If you have questions concerning the risk of infections, discuss them with your doctor or health care professional. °What side effects may I notice from receiving this medicine? °Side effects that you should report to your doctor or health care professional as soon as possible: °-allergic reactions like skin rash, itching or hives, swelling of the face, lips, or tongue °-breathing problems °-changes in heartbeat °-fever, chills °-pain, redness or swelling at the injection site °-signs of viral infection including fever, drowsiness, chills, runny nose followed in about 2 weeks by a rash and joint pain °-tightness in the chest °Side effects that usually do not require medical attention (report to your doctor or health care professional if they continue or are bothersome): °-increased salivation °-nausea, vomiting °This list may not describe all possible side effects. Call your doctor for medical advice about side effects. You may report side effects to FDA at 1-800-FDA-1088. °Where should I keep my medicine? °This does not apply. You will not be given this medicine to store at home. °NOTE: This sheet is a summary. It may not cover all possible information. If you have questions about this medicine, talk to your doctor, pharmacist, or health care provider. °© 2015, Elsevier/Gold Standard. (2008-02-15 10:18:55) ° °

## 2014-12-13 NOTE — Procedures (Signed)
US guided diagnostic /therapeutic paracentesis performed yielding 8 liters (maximum ordered) yellow fluid. A portion of the fluid was sent to the lab for preordered studies. No immediate complications. The pt will receive IV albumin postprocedure.

## 2014-12-16 LAB — PATHOLOGIST SMEAR REVIEW

## 2014-12-16 LAB — BODY FLUID CULTURE: CULTURE: NO GROWTH

## 2015-01-01 ENCOUNTER — Other Ambulatory Visit (HOSPITAL_COMMUNITY): Payer: Self-pay | Admitting: Nurse Practitioner

## 2015-01-01 DIAGNOSIS — R188 Other ascites: Secondary | ICD-10-CM

## 2015-01-07 ENCOUNTER — Ambulatory Visit (HOSPITAL_COMMUNITY)
Admission: RE | Admit: 2015-01-07 | Discharge: 2015-01-07 | Disposition: A | Discharge status: Home / Self Care | Payer: Medicare Other | Source: Ambulatory Visit | Attending: Nurse Practitioner | Admitting: Nurse Practitioner

## 2015-01-07 DIAGNOSIS — R188 Other ascites: Secondary | ICD-10-CM | POA: Diagnosis present

## 2015-01-07 LAB — BODY FLUID CELL COUNT WITH DIFFERENTIAL
EOS FL: 0 %
LYMPHS FL: 24 %
MONOCYTE-MACROPHAGE-SEROUS FLUID: 75 % (ref 50–90)
Neutrophil Count, Fluid: 1 % (ref 0–25)
OTHER CELLS FL: 0 %
Total Nucleated Cell Count, Fluid: 141 cu mm (ref 0–1000)

## 2015-01-07 MED ORDER — LIDOCAINE HCL (PF) 1 % IJ SOLN
INTRAMUSCULAR | Status: AC
  Filled 2015-01-07: qty 10

## 2015-01-07 NOTE — Procedures (Signed)
Successful US guided paracentesis from RLQ.  Yielded 4.7L of clear yellow fluid.  No immediate complications.  Pt tolerated well.   Specimen was sent for labs.  Ascencion Dike PA-C 01/07/2015 2:18 PM

## 2015-01-08 LAB — PATHOLOGIST SMEAR REVIEW

## 2015-01-11 LAB — BODY FLUID CULTURE: Culture: NO GROWTH

## 2015-02-18 ENCOUNTER — Other Ambulatory Visit: Payer: Self-pay | Admitting: *Deleted

## 2015-02-18 DIAGNOSIS — R188 Other ascites: Secondary | ICD-10-CM

## 2015-02-24 ENCOUNTER — Ambulatory Visit (HOSPITAL_COMMUNITY)
Admission: RE | Admit: 2015-02-24 | Discharge: 2015-02-24 | Disposition: A | Discharge status: Home / Self Care | Payer: Medicare Other | Source: Ambulatory Visit | Attending: Family Medicine | Admitting: Family Medicine

## 2015-02-24 DIAGNOSIS — K7460 Unspecified cirrhosis of liver: Secondary | ICD-10-CM | POA: Insufficient documentation

## 2015-02-24 DIAGNOSIS — R188 Other ascites: Secondary | ICD-10-CM | POA: Insufficient documentation

## 2015-02-24 LAB — BODY FLUID CELL COUNT WITH DIFFERENTIAL
Eos, Fluid: 0 %
LYMPHS FL: 36 %
MONOCYTE-MACROPHAGE-SEROUS FLUID: 64 % (ref 50–90)
Neutrophil Count, Fluid: 0 % (ref 0–25)
Total Nucleated Cell Count, Fluid: 166 cu mm (ref 0–1000)

## 2015-02-24 MED ORDER — ALBUMIN HUMAN 25 % IV SOLN
12.5000 g | Freq: Once | INTRAVENOUS | Status: AC
  Administered 2015-02-24: 12.5 g via INTRAVENOUS

## 2015-02-24 MED ORDER — ALBUMIN HUMAN 25 % IV SOLN
12.5000 g | Freq: Once | INTRAVENOUS | Status: DC
  Filled 2015-02-24: qty 50

## 2015-02-24 MED ORDER — LIDOCAINE HCL (PF) 1 % IJ SOLN
INTRAMUSCULAR | Status: AC
  Filled 2015-02-24: qty 10

## 2015-02-24 NOTE — Procedures (Signed)
Successful US guided paracentesis from LLQ.  Yielded 5.6L of clear yellow fluid.  No immediate complications.  Pt tolerated well.   Specimen was sent for labs.  Ascencion Dike PA-C 02/24/2015 2:03 PM

## 2015-02-25 LAB — PATHOLOGIST SMEAR REVIEW: Path Review: REACTIVE

## 2015-02-28 LAB — BODY FLUID CULTURE: Culture: NO GROWTH

## 2015-05-24 ENCOUNTER — Inpatient Hospital Stay (HOSPITAL_COMMUNITY)
Admission: EM | Admit: 2015-05-24 | Discharge: 2015-05-26 | DRG: 421 | Disposition: A | Discharge status: Home Health | Payer: Medicare Other | Attending: Internal Medicine | Admitting: Internal Medicine

## 2015-05-24 ENCOUNTER — Encounter (HOSPITAL_COMMUNITY): Payer: Self-pay | Admitting: Emergency Medicine

## 2015-05-24 ENCOUNTER — Emergency Department (HOSPITAL_COMMUNITY): Payer: Medicare Other

## 2015-05-24 DIAGNOSIS — R4182 Altered mental status, unspecified: Secondary | ICD-10-CM | POA: Insufficient documentation

## 2015-05-24 DIAGNOSIS — Z79899 Other long term (current) drug therapy: Secondary | ICD-10-CM

## 2015-05-24 DIAGNOSIS — K7460 Unspecified cirrhosis of liver: Secondary | ICD-10-CM | POA: Diagnosis present

## 2015-05-24 DIAGNOSIS — Z7682 Awaiting organ transplant status: Secondary | ICD-10-CM | POA: Diagnosis not present

## 2015-05-24 DIAGNOSIS — D6959 Other secondary thrombocytopenia: Secondary | ICD-10-CM | POA: Diagnosis present

## 2015-05-24 DIAGNOSIS — B182 Chronic viral hepatitis C: Secondary | ICD-10-CM | POA: Diagnosis present

## 2015-05-24 DIAGNOSIS — D61818 Other pancytopenia: Secondary | ICD-10-CM | POA: Diagnosis present

## 2015-05-24 DIAGNOSIS — G894 Chronic pain syndrome: Secondary | ICD-10-CM | POA: Diagnosis present

## 2015-05-24 DIAGNOSIS — K7200 Acute and subacute hepatic failure without coma: Secondary | ICD-10-CM | POA: Diagnosis present

## 2015-05-24 DIAGNOSIS — R109 Unspecified abdominal pain: Secondary | ICD-10-CM

## 2015-05-24 DIAGNOSIS — Z96651 Presence of right artificial knee joint: Secondary | ICD-10-CM | POA: Diagnosis present

## 2015-05-24 DIAGNOSIS — Z87891 Personal history of nicotine dependence: Secondary | ICD-10-CM

## 2015-05-24 DIAGNOSIS — G8929 Other chronic pain: Secondary | ICD-10-CM | POA: Diagnosis present

## 2015-05-24 DIAGNOSIS — K7682 Hepatic encephalopathy: Secondary | ICD-10-CM | POA: Diagnosis present

## 2015-05-24 DIAGNOSIS — R188 Other ascites: Secondary | ICD-10-CM | POA: Diagnosis present

## 2015-05-24 DIAGNOSIS — K7290 Hepatic failure, unspecified without coma: Secondary | ICD-10-CM | POA: Diagnosis present

## 2015-05-24 LAB — COMPREHENSIVE METABOLIC PANEL
ALT: 13 U/L — AB (ref 17–63)
AST: 23 U/L (ref 15–41)
Albumin: 3.2 g/dL — ABNORMAL LOW (ref 3.5–5.0)
Alkaline Phosphatase: 63 U/L (ref 38–126)
Anion gap: 4 — ABNORMAL LOW (ref 5–15)
BUN: 9 mg/dL (ref 6–20)
CALCIUM: 8.8 mg/dL — AB (ref 8.9–10.3)
CHLORIDE: 106 mmol/L (ref 101–111)
CO2: 29 mmol/L (ref 22–32)
Creatinine, Ser: 0.88 mg/dL (ref 0.61–1.24)
GFR calc Af Amer: >60 mL/min (ref >60)
GFR calc non Af Amer: >60 mL/min (ref >60)
Glucose, Bld: 105 mg/dL — ABNORMAL HIGH (ref 65–99)
Potassium: 4.1 mmol/L (ref 3.5–5.1)
SODIUM: 139 mmol/L (ref 135–145)
Total Bilirubin: 1.6 mg/dL — ABNORMAL HIGH (ref 0.3–1.2)
Total Protein: 7.9 g/dL (ref 6.5–8.1)

## 2015-05-24 LAB — AMMONIA: Ammonia: 126 umol/L — ABNORMAL HIGH (ref 9–35)

## 2015-05-24 LAB — URINALYSIS, ROUTINE W REFLEX MICROSCOPIC
Bilirubin Urine: NEGATIVE
Glucose, UA: NEGATIVE mg/dL
Hgb urine dipstick: NEGATIVE
Ketones, ur: NEGATIVE mg/dL
LEUKOCYTES UA: NEGATIVE
NITRITE: NEGATIVE
PH: 5.5 (ref 5.0–8.0)
Protein, ur: NEGATIVE mg/dL
Specific Gravity, Urine: 1.015 (ref 1.005–1.030)
Urobilinogen, UA: 1 mg/dL (ref 0.0–1.0)

## 2015-05-24 LAB — CBC
HCT: 36.2 % — ABNORMAL LOW (ref 39.0–52.0)
Hemoglobin: 12.6 g/dL — ABNORMAL LOW (ref 13.0–17.0)
MCH: 30.3 pg (ref 26.0–34.0)
MCHC: 34.8 g/dL (ref 30.0–36.0)
MCV: 87 fL (ref 78.0–100.0)
PLATELETS: 44 10*3/uL — AB (ref 150–400)
RBC: 4.16 MIL/uL — AB (ref 4.22–5.81)
RDW: 15.9 % — AB (ref 11.5–15.5)
WBC: 2.8 10*3/uL — AB (ref 4.0–10.5)

## 2015-05-24 LAB — CBG MONITORING, ED: GLUCOSE-CAPILLARY: 93 mg/dL (ref 65–99)

## 2015-05-24 LAB — LIPASE, BLOOD: LIPASE: 18 U/L — AB (ref 22–51)

## 2015-05-24 MED ORDER — LACTULOSE 10 GM/15ML PO SOLN
30.0000 g | Freq: Two times a day (BID) | ORAL | Status: DC
  Administered 2015-05-24 – 2015-05-26 (×4): 30 g via ORAL
  Filled 2015-05-24 (×5): qty 45

## 2015-05-24 MED ORDER — SERTRALINE HCL 50 MG PO TABS
50.0000 mg | ORAL_TABLET | Freq: Every day | ORAL | Status: DC
  Administered 2015-05-25 – 2015-05-26 (×2): 50 mg via ORAL
  Filled 2015-05-24 (×2): qty 1

## 2015-05-24 MED ORDER — RIFAXIMIN 550 MG PO TABS
550.0000 mg | ORAL_TABLET | Freq: Once | ORAL | Status: AC
Start: 2015-05-24 — End: 2015-05-24
  Administered 2015-05-24: 550 mg via ORAL
  Filled 2015-05-24: qty 1

## 2015-05-24 MED ORDER — ALBUTEROL SULFATE HFA 108 (90 BASE) MCG/ACT IN AERS: 2.0000 | INHALATION_SPRAY | Freq: Four times a day (QID) | RESPIRATORY_TRACT | Status: DC | PRN

## 2015-05-24 MED ORDER — ONDANSETRON HCL 4 MG PO TABS: 4.0000 mg | ORAL_TABLET | Freq: Four times a day (QID) | ORAL | Status: DC | PRN

## 2015-05-24 MED ORDER — SPIRONOLACTONE 25 MG PO TABS
25.0000 mg | ORAL_TABLET | Freq: Every day | ORAL | Status: DC
  Administered 2015-05-25 – 2015-05-26 (×2): 25 mg via ORAL
  Filled 2015-05-24 (×2): qty 1

## 2015-05-24 MED ORDER — RIFAXIMIN 550 MG PO TABS
1100.0000 mg | ORAL_TABLET | Freq: Every day | ORAL | Status: DC
  Administered 2015-05-25 – 2015-05-26 (×2): 1100 mg via ORAL
  Filled 2015-05-24 (×2): qty 2

## 2015-05-24 MED ORDER — OXYCODONE HCL ER 40 MG PO T12A
40.0000 mg | EXTENDED_RELEASE_TABLET | Freq: Two times a day (BID) | ORAL | Status: DC
  Administered 2015-05-24 – 2015-05-26 (×4): 40 mg via ORAL
  Filled 2015-05-24 (×4): qty 1

## 2015-05-24 MED ORDER — LACTULOSE 10 GM/15ML PO SOLN
30.0000 g | Freq: Once | ORAL | Status: AC
  Administered 2015-05-24: 30 g via ORAL
  Filled 2015-05-24 (×2): qty 45

## 2015-05-24 MED ORDER — ALBUTEROL SULFATE (2.5 MG/3ML) 0.083% IN NEBU: 2.5000 mg | INHALATION_SOLUTION | Freq: Four times a day (QID) | RESPIRATORY_TRACT | Status: DC | PRN

## 2015-05-24 MED ORDER — ONDANSETRON HCL 4 MG/2ML IJ SOLN: 4.0000 mg | Freq: Four times a day (QID) | INTRAMUSCULAR | Status: DC | PRN

## 2015-05-24 MED ORDER — HYDROCODONE-ACETAMINOPHEN 10-325 MG PO TABS
1.0000 | ORAL_TABLET | ORAL | Status: DC | PRN
  Administered 2015-05-25 – 2015-05-26 (×2): 1 via ORAL
  Filled 2015-05-24 (×2): qty 1

## 2015-05-24 MED ORDER — FUROSEMIDE 40 MG PO TABS
40.0000 mg | ORAL_TABLET | Freq: Every day | ORAL | Status: DC
  Administered 2015-05-25 – 2015-05-26 (×2): 40 mg via ORAL
  Filled 2015-05-24 (×2): qty 1

## 2015-05-24 NOTE — H&P (Signed)
[facility] History and Physical  MAYO FAULK PYP:[id number] DOB: LL [birth year] DOA: 05/24/2015  Referring physician: EDP PCP: Stephens Shire, MD   Chief Complaint: drowsy, sleepy  HPI: Adrian Compton is a 59 y.o. male with history of cirrhosis secondary to hepatitis C, chronic pain syndrome on OxyContin, history of TIPS, ascites, thrombocytopenia on the transplant list at [facility]. Presents to the ER with the above complaints, history is provided by his wife at bedside who reports increased confusion and drowsiness for the last 3 or 4 days. He takes rifaximin on a daily basis, had not been taking lactulose due to frequent stools. Yesterday he did a little bit better however, this morning, is very drowsy started losing motor skills, and lethargic hence brought to the ER. In the ER noted to have elevated ammonia, mild pancytopenia  Review of Systems: Positives bolded Constitutional:  No weight loss, night sweats, Fevers, chills, fatigue.  HEENT:  No headaches, Difficulty swallowing,Tooth/dental problems,Sore throat,  No sneezing, itching, ear ache, nasal congestion, post nasal drip,  Cardio-vascular:  No chest pain, Orthopnea, PND, swelling in lower extremities, anasarca, dizziness, palpitations  GI:  No heartburn, indigestion, abdominal pain, nausea, vomiting, diarrhea, change in bowel habits, loss of appetite  Resp:  No shortness of breath with exertion or at rest. No excess mucus, no productive cough, No non-productive cough, No coughing up of blood.No change in color of mucus.No wheezing.No chest wall deformity  Skin:  no rash or lesions.  GU:  no dysuria, change in color of urine, no urgency or frequency. No flank pain.  Musculoskeletal:  No joint pain or swelling. No decreased range of motion. No back pain.  Psych:  No change in mood or affect. No depression or anxiety. No memory loss.   Past Medical History  Diagnosis Date  . Hepatitis C   .  Cirrhosis of liver due to hepatitis C   . Presence of right artificial knee joint   . Cirrhosis of liver    Past Surgical History  Procedure Laterality Date  . Knee arthroplasty    . Back surgery    . Laminectomy     Social History:  reports that he has quit smoking. He does not have any smokeless tobacco history on file. He reports that he does not drink alcohol or use illicit drugs.  Allergies  Allergen Reactions  . Nuvigil [Armodafinil] Anaphylaxis and Hives    Family History  Problem Relation Age of Onset  . Breast cancer Mother   . Diabetes type II Mother     Prior to Admission medications   Medication Sig Start Date End Date Taking? Authorizing Provider  albuterol (PROVENTIL HFA;VENTOLIN HFA) 108 (90 BASE) MCG/ACT inhaler Inhale 2 puffs into the lungs every 6 (six) hours as needed for wheezing.   Yes Historical Provider, MD  furosemide (LASIX) 40 MG tablet Take 1 tablet (40 mg total) by mouth daily. 01/30/13  Yes Verlee Monte, MD  HYDROcodone-acetaminophen (NORCO) 10-325 MG per tablet Take 1 tablet by mouth every 4 (four) hours as needed (pain).    Yes Historical Provider, MD  ondansetron (ZOFRAN) 4 MG tablet Take 1 tablet (4 mg total) by mouth every 6 (six) hours. 06/06/14  Yes Margarita Mail, PA-C  OxyCODONE (OXYCONTIN) 80 mg T12A 12 hr tablet Take 80 mg by mouth every 12 (twelve) hours.   Yes Historical Provider, MD  rifaximin (XIFAXAN) 550 MG TABS Take 1,100 mg by mouth daily.    Yes Historical Provider, MD  sertraline (ZOLOFT) 50 MG tablet Take 50 mg by mouth daily.   Yes Historical Provider, MD  spironolactone (ALDACTONE) 25 MG tablet Take 25 mg by mouth daily.   Yes Historical Provider, MD   Physical Exam: Filed Vitals:   05/24/15 1430 05/24/15 1445 05/24/15 1500 05/24/15 1515  BP: 148/86 124/67 124/87 124/84  Pulse: 76 70 81 85  Temp:      TempSrc:      Resp:      Height:      Weight:      SpO2: 98% 96% 96% 96%    Wt Readings from Last 3 Encounters:    05/24/15 104.327 kg (230 lb)  06/06/14 95.255 kg (210 lb)  11/22/13 95.255 kg (210 lb)    General:  Drowsy, arousable, answers questions and falls back to sleep Eyes: PERRL, normal lids, irises & conjunctiva ENT: grossly normal hearing, lips & tongue Neck: no LAD, masses or thyromegaly Cardiovascular: RRR, no m/r/g. 1+ edema Telemetry: SR, no arrhythmias  Respiratory: CTA bilaterally, no w/r/r. Normal respiratory effort. Abdomen: soft, distended, nontender, positive fluid thrill Skin: no rash or induration seen on limited exam Musculoskeletal: grossly normal tone BUE/BLE Psychiatric: grossly normal mood and affect, speech fluent and appropriate Neurologic: Asterixis noted           Labs on Admission:  Basic Metabolic Panel:  Recent Labs Lab 05/24/15 1135  NA 139  K 4.1  CL 106  CO2 29  GLUCOSE 105*  BUN 9  CREATININE 0.88  CALCIUM 8.8*   Liver Function Tests:  Recent Labs Lab 05/24/15 1135  AST 23  ALT 13*  ALKPHOS 63  BILITOT 1.6*  PROT 7.9  ALBUMIN 3.2*    Recent Labs Lab 05/24/15 1135  LIPASE 18*    Recent Labs Lab 05/24/15 1135  AMMONIA 126*   CBC:  Recent Labs Lab 05/24/15 1135  WBC 2.8*  HGB 12.6*  HCT 36.2*  MCV 87.0  PLT 44*   Cardiac Enzymes: No results for input(s): CKTOTAL, CKMB, CKMBINDEX, TROPONINI in the last 168 hours.  BNP (last 3 results) No results for input(s): BNP in the last 8760 hours.  ProBNP (last 3 results) No results for input(s): PROBNP in the last 8760 hours.  CBG:  Recent Labs Lab 05/24/15 1132  GLUCAP 93    Radiological Exams on Admission: Dg Chest 2 View  05/24/2015   CLINICAL DATA:  Altered mental status  EXAM: CHEST  2 VIEW  COMPARISON:  None.  FINDINGS: The heart size and mediastinal contours are within normal limits. Both lungs are clear. The visualized skeletal structures are unremarkable.  IMPRESSION: No active cardiopulmonary disease.   Electronically Signed   By: Marybelle Killings M.D.   On:  05/24/2015 13:43    Assessment/Plan    Hepatic encephalopathy -Compliant with Xifaxin, continue this -Also add lactulose 30 g twice a day -Etiology unclear, no evidence of upper GI bleed -Will get ultrasound guided paracentesis to rule out SBP    Abdominal pain -Due to ascites, rule out SBP with paracentesis    Hepatic cirrhosis due to chronic hepatitis C infection/ascites -History of TIPS, continue Lasix and Aldactone -On transplant list at [facility],  -secondary due to cirrhosis/splenic sequestration    Chronic pain -Involving back, knee, abdomen -Takes OxyContin 80 mg twice a day, will cut down dose to 40 mg twice a day due to hepatic encephalopathy  Code Status: Full code DVT Prophylaxis: SCds Family Communication: wife at bedside  Disposition Plan: inpatient  Time spent:67mn  [facility] Pager [phone]

## 2015-05-24 NOTE — Progress Notes (Signed)
NURSING PROGRESS NOTE  Adrian Compton [id number] Admission Data: 05/24/2015 6:11 PM Attending Provider: Domenic Polite, MD IXB:[id number],KSKSH A, MD Code Status: Full  Allergies:  Nuvigil Past Medical History:   has a past medical history of Hepatitis C; Cirrhosis of liver due to hepatitis C; Presence of right artificial knee joint; and Cirrhosis of liver. Past Surgical History:   has past surgical history that includes Knee Arthroplasty; Back surgery; and Laminectomy. Social History:   reports that he quit smoking about 10 years ago. He quit smokeless tobacco use about 10 years ago. His smokeless tobacco use included Snuff. He reports that he does not drink alcohol or use illicit drugs.  Adrian Compton is a 59 y.o. male patient admitted from ED:   Last Documented Vital Signs: Blood pressure 138/86, pulse 83, temperature 97.7 F (36.5 C), temperature source Oral, resp. rate 18, height '6\' 3"'$  (1.905 m), weight 104.327 kg (230 lb), SpO2 97 %.  Cardiac Monitoring: None ordered.  IV Fluids:  IV in place, occlusive dsg intact without redness, IV cath forearm left, condition patent and no redness none.   Skin: WDL  Patient/Family orientated to room. Information packet given to patient/family. Admission inpatient armband information verified with patient/family to include name and date of birth and placed on patient arm. Side rails up x 2, fall assessment and education completed with patient/family. Patient/family able to verbalize understanding of risk associated with falls and verbalized understanding to call for assistance before getting out of bed. Call light within reach. Patient/family able to voice and demonstrate understanding of unit orientation instructions.    Will continue to evaluate and treat per MD orders.   Hendricks Limes RN, BS, BSN

## 2015-05-24 NOTE — Progress Notes (Signed)
Per Radiology Ritu Paracentesis will be on 6/19, Radiologist has gone home for the day.

## 2015-05-24 NOTE — ED Notes (Signed)
Family reports that pt has encephalopathy and that he has had altered mental status for past few days. Pt appeared to be getting somewhat better yesterday, but upon waking up today he was worse again.

## 2015-05-24 NOTE — ED Notes (Signed)
Report attempted nurse to call back

## 2015-05-24 NOTE — ED Provider Notes (Signed)
CSN: [id number]     Arrival date & time 05/24/15  1037 History   First MD Initiated Contact with Patient 05/24/15 1121     Chief Complaint  Patient presents with  . Altered Mental Status     (Consider location/radiation/quality/duration/timing/severity/associated sxs/prior Treatment) HPI Comments: Patient with a history of Hepatitis C, Cirrhosis, and Hepatic Encephalopathy presents today with a chief complaint of AMS.  His wife reports that the patient has been confused and lethargic over the past 4 days, which worsened this morning.  Wife reports that he has had episodes of encephalopathy in the past secondary to an elevated Ammonia level.  He is currently on Zifaxin and has been compliant with the medication.  Wife reports that he does not take Lactulose due to the side effect of diarrhea.  Wife reports that he is followed by GI at [facility].  She states that he is currently on the liver transplant list.  Patient reports mild associated cough.  He denies fever, chills, nausea, vomiting, diarrhea, headache, abdominal pain, chest pain, urinary symptoms, or SOB.  No recent head trauma or injury.    Patient is a 59 y.o. male presenting with altered mental status. The history is provided by the patient.  Altered Mental Status   Past Medical History  Diagnosis Date  . Hepatitis C   . Cirrhosis of liver due to hepatitis C   . Presence of right artificial knee joint   . Cirrhosis of liver    Past Surgical History  Procedure Laterality Date  . Knee arthroplasty    . Back surgery    . Laminectomy     Family History  Problem Relation Age of Onset  . Breast cancer Mother   . Diabetes type II Mother    History  Substance Use Topics  . Smoking status: Former Research scientist (life sciences)  . Smokeless tobacco: Not on file  . Alcohol Use: No    Review of Systems  All other systems reviewed and are negative.     Allergies  Nuvigil  Home Medications   Prior to Admission medications   Medication Sig Start  Date End Date Taking? Authorizing Provider  albuterol (PROVENTIL HFA;VENTOLIN HFA) 108 (90 BASE) MCG/ACT inhaler Inhale 2 puffs into the lungs every 6 (six) hours as needed for wheezing.   Yes Historical Provider, MD  furosemide (LASIX) 40 MG tablet Take 1 tablet (40 mg total) by mouth daily. 01/30/13  Yes Verlee Monte, MD  HYDROcodone-acetaminophen (NORCO) 10-325 MG per tablet Take 1 tablet by mouth every 4 (four) hours as needed (pain).    Yes Historical Provider, MD  ondansetron (ZOFRAN) 4 MG tablet Take 1 tablet (4 mg total) by mouth every 6 (six) hours. 06/06/14  Yes Margarita Mail, PA-C  OxyCODONE (OXYCONTIN) 80 mg T12A 12 hr tablet Take 80 mg by mouth every 12 (twelve) hours.   Yes Historical Provider, MD  rifaximin (XIFAXAN) 550 MG TABS Take 1,100 mg by mouth daily.    Yes Historical Provider, MD  sertraline (ZOLOFT) 50 MG tablet Take 50 mg by mouth daily.   Yes Historical Provider, MD  spironolactone (ALDACTONE) 25 MG tablet Take 25 mg by mouth daily.   Yes Historical Provider, MD   BP 136/86 mmHg  Pulse 81  Temp(Src) 98.4 F (36.9 C) (Oral)  Resp 19  Ht '6\' 3"'$  (1.905 m)  Wt 230 lb (104.327 kg)  BMI 28.75 kg/m2  SpO2 96% Physical Exam  Constitutional: He appears well-developed and well-nourished.  HENT:  Head: Normocephalic  and atraumatic.  Mouth/Throat: Oropharynx is clear and moist.  Neck: Normal range of motion. Neck supple.  Cardiovascular: Normal rate, regular rhythm and normal heart sounds.   Pulmonary/Chest: Effort normal and breath sounds normal.  Abdominal: Soft. Bowel sounds are normal. He exhibits distension. He exhibits no mass. There is no tenderness. There is no rebound and no guarding.  Musculoskeletal: Normal range of motion.  Neurological: He is alert. He has normal strength. No cranial nerve deficit.  Asterixis present  Skin: Skin is warm and dry.  Psychiatric: He has a normal mood and affect.  Nursing note and vitals reviewed.   ED Course  Procedures  (including critical care time) Labs Review Labs Reviewed  CBC - Abnormal; Notable for the following:    WBC 2.8 (*)    RBC 4.16 (*)    Hemoglobin 12.6 (*)    HCT 36.2 (*)    RDW 15.9 (*)    Platelets 44 (*)    All other components within normal limits  COMPREHENSIVE METABOLIC PANEL - Abnormal; Notable for the following:    Glucose, Bld 105 (*)    Calcium 8.8 (*)    Albumin 3.2 (*)    ALT 13 (*)    Total Bilirubin 1.6 (*)    Anion gap 4 (*)    All other components within normal limits  URINALYSIS, ROUTINE W REFLEX MICROSCOPIC (NOT AT [facility]) - Abnormal; Notable for the following:    Color, Urine AMBER (*)    All other components within normal limits  LIPASE, BLOOD - Abnormal; Notable for the following:    Lipase 18 (*)    All other components within normal limits  AMMONIA - Abnormal; Notable for the following:    Ammonia 126 (*)    All other components within normal limits  CBG MONITORING, ED    Imaging Review Dg Chest 2 View  05/24/2015   CLINICAL DATA:  Altered mental status  EXAM: CHEST  2 VIEW  COMPARISON:  None.  FINDINGS: The heart size and mediastinal contours are within normal limits. Both lungs are clear. The visualized skeletal structures are unremarkable.  IMPRESSION: No active cardiopulmonary disease.   Electronically Signed   By: Marybelle Killings M.D.   On: 05/24/2015 13:43     EKG Interpretation None      MDM   Final diagnoses:  Altered mental status   Patient with a history of Hepatitis C and Cirrhosis presents today with confusion.  Ammonia level today is elevated at 126.  Other labs are unremarkable when compared to prior labs.  He  Is currently on Zifaxin and reports compliance with the medication.  Patient given Lactulose in the ED.  Patient is afebrile.  UA is negative for infection.  CXR is also negative.  [facility] further management of Hepatic Encephalopathy.      Hyman Bible, PA-C 05/24/15 [place],  MD 05/25/15 [phone]

## 2015-05-24 NOTE — ED Notes (Signed)
Pt is refusing to take Lactulose because last time it caused him to have serve diarrhea. Side effects of medication were explained to patient as well as benefits to receiving the medication and risks of not. PA heather L was made aware and states she will come and speak with pt. Wife is at bedside

## 2015-05-25 ENCOUNTER — Inpatient Hospital Stay (HOSPITAL_COMMUNITY): Payer: Medicare Other

## 2015-05-25 DIAGNOSIS — K7290 Hepatic failure, unspecified without coma: Secondary | ICD-10-CM

## 2015-05-25 DIAGNOSIS — R188 Other ascites: Secondary | ICD-10-CM

## 2015-05-25 LAB — CBC
HCT: 34.2 % — ABNORMAL LOW (ref 39.0–52.0)
HEMOGLOBIN: 11.8 g/dL — AB (ref 13.0–17.0)
MCH: 29.6 pg (ref 26.0–34.0)
MCHC: 34.5 g/dL (ref 30.0–36.0)
MCV: 85.9 fL (ref 78.0–100.0)
Platelets: 36 10*3/uL — ABNORMAL LOW (ref 150–400)
RBC: 3.98 MIL/uL — ABNORMAL LOW (ref 4.22–5.81)
RDW: 15.5 % (ref 11.5–15.5)
WBC: 2.6 10*3/uL — AB (ref 4.0–10.5)

## 2015-05-25 LAB — BODY FLUID CELL COUNT WITH DIFFERENTIAL
EOS FL: 1 %
Lymphs, Fluid: 30 %
MONOCYTE-MACROPHAGE-SEROUS FLUID: 61 % (ref 50–90)
NEUTROPHIL FLUID: 5 % (ref 0–25)
Other Cells, Fluid: 3 %
Total Nucleated Cell Count, Fluid: 168 cu mm (ref 0–1000)

## 2015-05-25 LAB — GRAM STAIN

## 2015-05-25 LAB — AMMONIA: AMMONIA: 78 umol/L — AB (ref 9–35)

## 2015-05-25 MED ORDER — LIDOCAINE HCL (PF) 1 % IJ SOLN
INTRAMUSCULAR | Status: AC
  Filled 2015-05-25: qty 10

## 2015-05-25 NOTE — Progress Notes (Signed)
Utilization Review completed. Harneet Noblett RN BSN CM 

## 2015-05-25 NOTE — Progress Notes (Signed)
Patient ID: Adrian Compton, male   DOB: 1955/12/08, 59 y.o.   MRN: [id number]  [facility] PROGRESS NOTE  Adrian Compton:[id number] DOB: Jun 04, 1956 DOA: 05/24/2015 PCP: Stephens Shire, MD   Brief narrative:    60 y.o. male with history of cirrhosis secondary to hepatitis C, chronic pain syndrome on OxyContin, history of TIPS, ascites, thrombocytopenia on the transplant list at [facility], presented to [facility] ED for evaluation of several days duration of progressive lethargy, poor oral intake, confusion. Pt was unable to provide history at the time of the admission and wife at bedside explained pt was taking rifaximin daily but has not been using lactulose due to diarrhea.  In ED, blood work reveled elevated ammonia level, WBC 2.8, Plt 44. [facility] asked to admit for further evaluation.   Assessment/Plan:    Active Problems: Acute hepatic encephalopathy - continuing Rifaximin but lactulose also added - will repeat ammonia level this AM - US guided paracentesis requested to rule out SBP as underlying etiology - mental status is clearer this AM  Abd pain with ascites - paracentesis requested - appreciate IR assistance  - pt reports abd pain is now resolved   Hepatic cirrhosis secondary to chronic hep C - continue Lasix and aldactone as per home medical regimen    Pancytopenia/Thrombocytopenia,  - secondary due to cirrhosis/splenic sequestration - repeat CBC in AM  DVT prophylaxis - SCD's  Code Status: Full.  Family Communication:  plan of care discussed with the patient Disposition Plan: Home in 24 - 48 hours   IV access:  Peripheral IV  Procedures and diagnostic studies:    Dg Chest 2 View 05/24/2015   No active cardiopulmonary disease.     Medical Consultants:  IR  Other Consultants:  IR  IAnti-Infectives:   None   Faye Ramsay, MD  [place] Pager [phone]  If 7PM-7AM, please contact night-coverage [email or web address] Password [email or web address] 05/25/2015, 11:07  AM   LOS: 1 day   HPI/Subjective: No events overnight.   Objective: Filed Vitals:   05/25/15 0852 05/25/15 0857 05/25/15 0902 05/25/15 0907  BP: 140/79 131/80 130/71 128/67  Pulse:      Temp:      TempSrc:      Resp:      Height:      Weight:      SpO2:        Intake/Output Summary (Last 24 hours) at 05/25/15 1107 Last data filed at 05/24/15 2216  Gross per 24 hour  Intake    200 ml  Output    350 ml  Net   -150 ml    Exam:   General:  Pt is alert but slow to respond   Cardiovascular: Regular rate and rhythm, S1/S2, no murmurs, no rubs, no gallops  Respiratory: Clear to auscultation bilaterally, no wheezing, diminished breath sounds at bases   Abdomen: Soft, non tender, distended, bowel sounds present, no guarding  Data Reviewed: Basic Metabolic Panel:  Recent Labs Lab 05/24/15 1135  NA 139  K 4.1  CL 106  CO2 29  GLUCOSE 105*  BUN 9  CREATININE 0.88  CALCIUM 8.8*   Liver Function Tests:  Recent Labs Lab 05/24/15 1135  AST 23  ALT 13*  ALKPHOS 63  BILITOT 1.6*  PROT 7.9  ALBUMIN 3.2*    Recent Labs Lab 05/24/15 1135  LIPASE 18*    Recent Labs Lab 05/24/15 1135  AMMONIA 126*   CBC:  Recent Labs Lab 05/24/15 1135  WBC 2.8*  HGB 12.6*  HCT 36.2*  MCV 87.0  PLT 44*   CBG:  Recent Labs Lab 05/24/15 1132  GLUCAP 93    No results found for this or any previous visit (from the past 240 hour(s)).   Scheduled Meds: . furosemide  40 mg Oral Daily  . lactulose  30 g Oral BID  . lidocaine (PF)      . OxyCODONE  40 mg Oral Q12H  . rifaximin  1,100 mg Oral Daily  . sertraline  50 mg Oral Daily  . spironolactone  25 mg Oral Daily   Continuous Infusions:

## 2015-05-25 NOTE — Procedures (Signed)
Successful US guided paracentesis from RUQ.  Yielded 3.2 liters of serous fluid.  No immediate complications.  Pt tolerated well.   Specimen was sent for labs.  Tsosie Billing D PA-C 05/25/2015 9:11 AM

## 2015-05-26 DIAGNOSIS — R4182 Altered mental status, unspecified: Secondary | ICD-10-CM

## 2015-05-26 DIAGNOSIS — B182 Chronic viral hepatitis C: Secondary | ICD-10-CM

## 2015-05-26 LAB — COMPREHENSIVE METABOLIC PANEL
ALBUMIN: 3.2 g/dL — AB (ref 3.5–5.0)
ALT: 11 U/L — ABNORMAL LOW (ref 17–63)
AST: 20 U/L (ref 15–41)
Alkaline Phosphatase: 60 U/L (ref 38–126)
Anion gap: 7 (ref 5–15)
BUN: 13 mg/dL (ref 6–20)
CALCIUM: 8.5 mg/dL — AB (ref 8.9–10.3)
CO2: 28 mmol/L (ref 22–32)
Chloride: 99 mmol/L — ABNORMAL LOW (ref 101–111)
Creatinine, Ser: 0.92 mg/dL (ref 0.61–1.24)
GFR calc Af Amer: >60 mL/min (ref >60)
GFR calc non Af Amer: >60 mL/min (ref >60)
Glucose, Bld: 91 mg/dL (ref 65–99)
Potassium: 3.9 mmol/L (ref 3.5–5.1)
Sodium: 134 mmol/L — ABNORMAL LOW (ref 135–145)
Total Bilirubin: 2.6 mg/dL — ABNORMAL HIGH (ref 0.3–1.2)
Total Protein: 7.1 g/dL (ref 6.5–8.1)

## 2015-05-26 LAB — CBC
HEMATOCRIT: 35 % — AB (ref 39.0–52.0)
Hemoglobin: 12.1 g/dL — ABNORMAL LOW (ref 13.0–17.0)
MCH: 29.6 pg (ref 26.0–34.0)
MCHC: 34.6 g/dL (ref 30.0–36.0)
MCV: 85.6 fL (ref 78.0–100.0)
Platelets: 36 10*3/uL — ABNORMAL LOW (ref 150–400)
RBC: 4.09 MIL/uL — AB (ref 4.22–5.81)
RDW: 15.7 % — AB (ref 11.5–15.5)
WBC: 3.1 10*3/uL — ABNORMAL LOW (ref 4.0–10.5)

## 2015-05-26 LAB — AMMONIA: Ammonia: 48 umol/L — ABNORMAL HIGH (ref 9–35)

## 2015-05-26 LAB — PATHOLOGIST SMEAR REVIEW

## 2015-05-26 MED ORDER — LACTULOSE 10 GM/15ML PO SOLN: 30.0000 g | Freq: Two times a day (BID) | ORAL | Status: DC

## 2015-05-26 MED ORDER — OXYCODONE HCL ER 40 MG PO T12A: 40.0000 mg | EXTENDED_RELEASE_TABLET | Freq: Two times a day (BID) | ORAL | Status: DC

## 2015-05-26 NOTE — Discharge Instructions (Signed)
Hepatic Encephalopathy °Hepatic encephalopathy is a syndrome. This is a set of symptoms that occur together. It is seen mostly in patients with damage to the liver known as cirrhosis. This is where normal liver tissue has been replaced by scar tissue.  °Symptoms of the syndrome include: °· Changes in personality. °· Mental impairment. °· A depressed level of consciousness. °These changes occur because toxins build up in the bloodstream. The build up occurs because the scarred liver cannot rid toxins from the body. The most important of these toxins is ammonia. Toxins can cause abnormal behavior and confusion. Toxins in the blood stream can impair your ability to take care of yourself or others. Some people become very sleepy and cannot be woken easily. In severe cases, the patient lapses into a coma.  °CAUSES  °There are many things that can cause liver damage that can lead to buildup of toxins. These include: °· Diseases that cause cirrhosis of the liver. °· Long-term alcohol use with progressive liver damage. °· Hepatitis B or C with ongoing infection and liver damage. °· Patients without cirrhosis who have undergone shunt surgery. °· Kidney failure. °· Bleeding in the stomach or intestines. °· Infection. °· Constipation. °· Medications that act upon the central nervous system. °· Diuretic therapy. °· Excessive dietary protein. °SYMPTOMS  °Symptoms of this syndrome are categorized or "staged" based on severity.  °· Stage 0. Minimal hepatic encephalopathy. No detectable changes in personality or behavior. Minimal changes in memory, concentration, mental function, and physical ability. °· Stage 1. Some lack of awareness. Shortened attention span. Problems with addition or subtraction. Possible problems with sleeping or a reversal of the normal sleep pattern. Euphoria, depression, or irritability may be present. Mild confusion. Slowing of mental ability. Tremors may be detected. °· Stage 2. Lethargy or apathy.  Disoriented. Strange behavior. Slurred speech. Obvious tremors. Drowsiness, unable to perform mental tasks. Personality changes, and confusion about time. °· Stage 3. Very sleepy but can be aroused. Unable to perform mental tasks, cannot keep track of time and place, marked confusion, amnesia, occasional fits of rage, speech cannot be understood. °· Stage 4. Coma with or without response to painful stimuli. °DIAGNOSIS  °In mild cases, a careful history and physical exam may lead your caregiver to consider possible mild hepatic encephalopathy as the cause of symptoms. The diagnosis is clearer in more severe cases. An elevated blood ammonia level is the classic blood test abnormality in patients with this syndrome. Other tests can be helpful to rule out other diseases.  °TREATMENT  °· Medications are often used to lower the ammonia level in the blood. This usually leads to improvement. °· Diets containing vegetable proteins are better than diets rich in animal protein, especially proteins derived from red meats. Eating well-cooked chicken and fish in addition to vegetable protein should be discussed with your caregiver. Malnourished patients are encouraged to add liquid nutritional supplements to their diet. °· Antibiotics are sometimes used to try to lessen the volume of bacteria in the intestines that produce ammonia. °· Moderate to severe cases of this syndrome usually require a hospital stay and medicine that is given directly into a vein (intravenously). °HOME CARE INSTRUCTIONS  °The goal at home is to avoid things that can make the condition worse and lead to a buildup of ammonia in the blood. °· Eat a well balanced diet. Your caregiver can help you with suggestions on this. °· Talk to your caregiver before taking vitamin supplements. Large doses of vitamins and minerals,   especially vitamin A, iron, or copper, can worsen liver damage. °· A low salt diet, water restriction, or diuretic medicine may be needed to  reduce fluid retention. °· Avoid alcohol and acetaminophen as well as any over-the-counter medications that contain acetaminophen (check labels). Only take over-the-counter or prescription medicines for pain, discomfort, or fever as directed by your caregiver. °· Avoid drugs that are toxic to the liver. Review your medications (both prescription and non-prescription) with your caregiver to make sure those you are taking will not be harmful. °· Blood tests may be needed. Follow your caregiver's advice regarding the timing of these. °· With this condition you play a critical role in maintaining your own good health. The failure to follow your caregiver's advice and these instructions may result in permanent disability or death. °SEEK MEDICAL CARE IF:  °· You have increasing fatigue or weakness. °· You develop increasing swelling of the abdomen, hands, feet, legs or face. °· You develop loss of appetite. °· You are feeling sick to your stomach (nausea) and vomiting. °· You develop jaundice. This is a yellow discoloration of the skin. °· You develop worsening problems with concentration, confusion, and/or problems with sleep. °SEEK IMMEDIATE MEDICAL CARE IF:  °· You vomit bright red blood or a coffee ground-looking material. °· You have blood in your stools. Or the stools turn black and tarry. °· You have a fever. °· You develop easy bruising or bleeding. °· You have a return of slurred speech, change in behavior, or confusion. °MAKE SURE YOU:  °· Understand these instructions. °· Will watch your condition. °· Will get help right away if you are not doing well or get worse. °Document Released: 02/01/2007 Document Revised: 02/14/2012 Document Reviewed: 11/08/2007 °Exit[employer]® Patient Information ©2015 [employer]. This information is not intended to replace advice given to you by your health care provider. Make sure you discuss any questions you have with your health care provider. ° °

## 2015-05-26 NOTE — Discharge Summary (Signed)
Physician Discharge Summary  KERMAN PFOST YBW:[id number] DOB: Oct 28, 1956 DOA: 05/24/2015  PCP: Stephens Shire, MD  Admit date: 05/24/2015 Discharge date: 05/26/2015  Recommendations for Outpatient Follow-up:  1. Pt will need to follow up with PCP in 2-3 weeks post discharge 2. Please obtain BMP to evaluate electrolytes and kidney function 3. Please also check CBC to evaluate Hg and Hct levels  Discharge Diagnoses:  Active Problems:   Abdominal pain   Hepatic cirrhosis due to chronic hepatitis C infection   Ascites   Thrombocytopenia, secondary due to cirrhosis   Hepatic encephalopathy   Chronic pain   Discharge Condition: Stable  Diet recommendation: Heart healthy diet discussed in details    Brief narrative:    59 y.o. male with history of cirrhosis secondary to hepatitis C, chronic pain syndrome on OxyContin, history of TIPS, ascites, thrombocytopenia on the transplant list at [facility], presented to [facility] ED for evaluation of several days duration of progressive lethargy, poor oral intake, confusion. Pt was unable to provide history at the time of the admission and wife at bedside explained pt was taking rifaximin daily but has not been using lactulose due to diarrhea.  In ED, blood work reveled elevated ammonia level, WBC 2.8, Plt 44. [facility] asked to admit for further evaluation.   Assessment/Plan:    Active Problems: Acute hepatic encephalopathy - continuing Rifaximin and lactulose - mental status more clear this AM, pt insist on going home - ammonia level pending on discharge and pt made aware  - US guided paracentesis done 6/19, 3.2 L fluid removed with no organisms noted in fluid on analysis   Abd pain with ascites - paracentesis done and pt feels better, wants to go home  - appreciate IR assistance  - pt reports abd pain is now resolved   Hepatic cirrhosis secondary to chronic hep C - continue Lasix and aldactone as per home medical regimen    Pancytopenia/Thrombocytopenia,  - secondary due to cirrhosis/splenic sequestration  DVT prophylaxis - SCD's  Code Status: Full.  Family Communication: plan of care discussed with the patient Disposition Plan: Home  IV access:  Peripheral IV  Procedures and diagnostic studies:   Dg Chest 2 View 05/24/2015 No active cardiopulmonary disease.   Medical Consultants:  IR  Other Consultants:  IR  IAnti-Infectives:   None       Discharge Exam: Filed Vitals:   05/26/15 0524  BP: 120/67  Pulse: 73  Temp: 97.9 F (36.6 C)  Resp: 20   Filed Vitals:   05/25/15 0902 05/25/15 0907 05/25/15 2144 05/26/15 0524  BP: 130/71 128/67 116/55 120/67  Pulse:   71 73  Temp:   98.4 F (36.9 C) 97.9 F (36.6 C)  TempSrc:   Oral Oral  Resp:   22 20  Height:      Weight:      SpO2:   95% 97%    General: Pt is alert, follows commands appropriately, not in acute distress Cardiovascular: Regular rate and rhythm, S1/S2 +, no murmurs, no rubs, no gallops Respiratory: Clear to auscultation bilaterally, no wheezing, no crackles, no rhonchi Abdominal: Soft, non tender, slightly distended, bowel sounds +, no guarding  Discharge Instructions     Medication List    TAKE these medications        albuterol 108 (90 BASE) MCG/ACT inhaler  Commonly known as:  PROVENTIL HFA;VENTOLIN HFA  Inhale 2 puffs into the lungs every 6 (six) hours as needed for wheezing.  furosemide 40 MG tablet  Commonly known as:  LASIX  Take 1 tablet (40 mg total) by mouth daily.     HYDROcodone-acetaminophen 10-325 MG per tablet  Commonly known as:  NORCO  Take 1 tablet by mouth every 4 (four) hours as needed (pain).     lactulose 10 GM/15ML solution  Commonly known as:  CHRONULAC  Take 45 mLs (30 g total) by mouth 2 (two) times daily.     ondansetron 4 MG tablet  Commonly known as:  ZOFRAN  Take 1 tablet (4 mg total) by mouth every 6 (six) hours.     OxyCODONE 40 mg T12a  12 hr tablet  Commonly known as:  OXYCONTIN  Take 1 tablet (40 mg total) by mouth every 12 (twelve) hours.     rifaximin 550 MG Tabs tablet  Commonly known as:  XIFAXAN  Take 1,100 mg by mouth daily.     sertraline 50 MG tablet  Commonly known as:  ZOLOFT  Take 50 mg by mouth daily.     spironolactone 25 MG tablet  Commonly known as:  ALDACTONE  Take 25 mg by mouth daily.            Follow-up Information    Follow up with Stephens Shire, MD.   Specialty:  Family Medicine   Contact information:   [address] [place] [address] [phone]        The results of significant diagnostics from this hospitalization (including imaging, microbiology, ancillary and laboratory) are listed below for reference.     Microbiology: Recent Results (from the past 240 hour(s))  Gram stain     Status: None   Collection Time: 05/25/15  9:18 AM  Result Value Ref Range Status   Specimen Description FLUID PERITONEAL  Final   Special Requests NONE  Final   Gram Stain   Final    CYTOSPIN SMEAR WBC PRESENT,BOTH PMN AND MONONUCLEAR NO ORGANISMS SEEN    Report Status 05/25/2015 FINAL  Final     Labs: Basic Metabolic Panel:  Recent Labs Lab 05/24/15 1135  NA 139  K 4.1  CL 106  CO2 29  GLUCOSE 105*  BUN 9  CREATININE 0.88  CALCIUM 8.8*   Liver Function Tests:  Recent Labs Lab 05/24/15 1135  AST 23  ALT 13*  ALKPHOS 63  BILITOT 1.6*  PROT 7.9  ALBUMIN 3.2*    Recent Labs Lab 05/24/15 1135  LIPASE 18*    Recent Labs Lab 05/24/15 1135 05/25/15 1252  AMMONIA 126* 78*   CBC:  Recent Labs Lab 05/24/15 1135 05/25/15 1252  WBC 2.8* 2.6*  HGB 12.6* 11.8*  HCT 36.2* 34.2*  MCV 87.0 85.9  PLT 44* 36*    CBG:  Recent Labs Lab 05/24/15 1132  GLUCAP 93     SIGNED: Time coordinating discharge: 30 minutes  Faye Ramsay, MD  [facility] 05/26/2015, 7:37 AM Pager [phone]  If 7PM-7AM, please contact  night-coverage [email or web address] Password [email or web address]

## 2015-05-26 NOTE — Progress Notes (Signed)
NURSING PROGRESS NOTE  ALEKSANDR PELLOW [id number] Discharge Data: 05/26/2015 11:30 AM Attending Provider: Theodis Blaze, MD KGO:VPCHEKB,TCYEL A, MD   Lytle Michaels to be D/C'd Home per MD order with wife, copies of prescriptions sent to patient preference pharmacy.     All IV's will be discontinued and monitored for bleeding.  All belongings will be returned to patient for patient to take home.  Last Documented Vital Signs:  Blood pressure 120/67, pulse 73, temperature 97.9 F (36.6 C), temperature source Oral, resp. rate 20, height '6\' 3"'$  (1.905 m), weight 104.327 kg (230 lb), SpO2 97 %.  Hendricks Limes RN, BS, BSN

## 2015-05-26 NOTE — Progress Notes (Signed)
Miranda at [facility] notifed of referral. [facility] to call wife at [phone] to schedule first home visit, Miranda notified to have agency call wife.

## 2015-05-30 LAB — CULTURE, BODY FLUID-BOTTLE: CULTURE: NO GROWTH

## 2015-05-30 LAB — CULTURE, BODY FLUID W GRAM STAIN -BOTTLE

## 2015-08-29 ENCOUNTER — Other Ambulatory Visit (HOSPITAL_COMMUNITY): Payer: Self-pay | Admitting: Gastroenterology

## 2015-08-29 DIAGNOSIS — K7469 Other cirrhosis of liver: Secondary | ICD-10-CM

## 2015-08-29 DIAGNOSIS — R188 Other ascites: Secondary | ICD-10-CM

## 2015-09-03 ENCOUNTER — Ambulatory Visit (HOSPITAL_COMMUNITY)
Admission: RE | Admit: 2015-09-03 | Discharge: 2015-09-03 | Disposition: A | Discharge status: Home / Self Care | Payer: Medicare Other | Source: Ambulatory Visit | Attending: Gastroenterology | Admitting: Gastroenterology

## 2015-09-03 ENCOUNTER — Encounter (HOSPITAL_COMMUNITY): Admission: RE | Admit: 2015-09-03 | Payer: 59 | Source: Ambulatory Visit

## 2015-09-03 ENCOUNTER — Other Ambulatory Visit (HOSPITAL_COMMUNITY): Payer: Self-pay | Admitting: Gastroenterology

## 2015-09-03 DIAGNOSIS — K7469 Other cirrhosis of liver: Secondary | ICD-10-CM

## 2015-09-03 DIAGNOSIS — K7460 Unspecified cirrhosis of liver: Secondary | ICD-10-CM | POA: Insufficient documentation

## 2015-09-03 DIAGNOSIS — R188 Other ascites: Secondary | ICD-10-CM

## 2015-09-03 NOTE — Progress Notes (Signed)
Patient ID: Adrian Compton, male   DOB: [birth year], 59 y.o.   MRN: [id number] Patient presented to ultrasound department today for therapeutic paracentesis. On limited ultrasound of abdomen in all 4 quadrants there is only trace amount of ascites noted. Paracentesis was canceled. Patient informed.

## 2016-06-09 ENCOUNTER — Other Ambulatory Visit (HOSPITAL_COMMUNITY): Payer: Self-pay | Admitting: Gastroenterology

## 2016-06-09 DIAGNOSIS — R188 Other ascites: Secondary | ICD-10-CM

## 2016-06-14 ENCOUNTER — Ambulatory Visit (HOSPITAL_COMMUNITY)
Admission: RE | Admit: 2016-06-14 | Discharge: 2016-06-14 | Disposition: A | Discharge status: Home / Self Care | Payer: Medicare Other | Source: Ambulatory Visit | Attending: Gastroenterology | Admitting: Gastroenterology

## 2016-06-14 DIAGNOSIS — R188 Other ascites: Secondary | ICD-10-CM | POA: Diagnosis present

## 2016-06-14 MED ORDER — ALBUMIN HUMAN 25 % IV SOLN
50.0000 g | Freq: Once | INTRAVENOUS | Status: AC
  Administered 2016-06-14: 50 g via INTRAVENOUS
  Filled 2016-06-14: qty 200

## 2016-06-14 MED ORDER — LIDOCAINE HCL 1 % IJ SOLN
INTRAMUSCULAR | Status: AC
  Filled 2016-06-14: qty 20

## 2016-06-14 NOTE — Procedures (Signed)
   US guided RLQ paracentesis Tolerated well  4.1 liters yellow fluid  Sent for labs

## 2016-08-19 ENCOUNTER — Other Ambulatory Visit (HOSPITAL_COMMUNITY): Payer: Self-pay | Admitting: Gastroenterology

## 2016-08-19 DIAGNOSIS — B1920 Unspecified viral hepatitis C without hepatic coma: Secondary | ICD-10-CM

## 2016-08-19 DIAGNOSIS — K7682 Hepatic encephalopathy: Secondary | ICD-10-CM

## 2016-08-19 DIAGNOSIS — K7460 Unspecified cirrhosis of liver: Secondary | ICD-10-CM

## 2016-08-19 DIAGNOSIS — R188 Other ascites: Secondary | ICD-10-CM

## 2016-08-19 DIAGNOSIS — K7290 Hepatic failure, unspecified without coma: Secondary | ICD-10-CM

## 2016-08-23 ENCOUNTER — Ambulatory Visit (HOSPITAL_COMMUNITY)
Admission: RE | Admit: 2016-08-23 | Discharge: 2016-08-23 | Disposition: A | Discharge status: Home / Self Care | Payer: Medicare Other | Source: Ambulatory Visit | Attending: Gastroenterology | Admitting: Gastroenterology

## 2016-08-23 ENCOUNTER — Other Ambulatory Visit (HOSPITAL_COMMUNITY): Payer: Self-pay | Admitting: Gastroenterology

## 2016-08-23 DIAGNOSIS — K7460 Unspecified cirrhosis of liver: Secondary | ICD-10-CM | POA: Diagnosis not present

## 2016-08-23 DIAGNOSIS — K7290 Hepatic failure, unspecified without coma: Secondary | ICD-10-CM

## 2016-08-23 DIAGNOSIS — B1920 Unspecified viral hepatitis C without hepatic coma: Secondary | ICD-10-CM

## 2016-08-23 DIAGNOSIS — K7682 Hepatic encephalopathy: Secondary | ICD-10-CM

## 2016-08-23 DIAGNOSIS — R188 Other ascites: Secondary | ICD-10-CM

## 2016-12-30 ENCOUNTER — Other Ambulatory Visit (HOSPITAL_COMMUNITY): Payer: Self-pay | Admitting: Gastroenterology

## 2016-12-30 DIAGNOSIS — R188 Other ascites: Principal | ICD-10-CM

## 2016-12-30 DIAGNOSIS — K7460 Unspecified cirrhosis of liver: Secondary | ICD-10-CM

## 2017-01-04 ENCOUNTER — Ambulatory Visit (HOSPITAL_COMMUNITY)
Admission: RE | Admit: 2017-01-04 | Discharge: 2017-01-04 | Disposition: A | Discharge status: Home / Self Care | Payer: Medicare Other | Source: Ambulatory Visit | Attending: Gastroenterology | Admitting: Gastroenterology

## 2017-01-04 DIAGNOSIS — R188 Other ascites: Secondary | ICD-10-CM | POA: Insufficient documentation

## 2017-01-04 DIAGNOSIS — K7460 Unspecified cirrhosis of liver: Secondary | ICD-10-CM | POA: Diagnosis present

## 2017-01-04 DIAGNOSIS — K7290 Hepatic failure, unspecified without coma: Secondary | ICD-10-CM | POA: Diagnosis not present

## 2017-01-04 DIAGNOSIS — R161 Splenomegaly, not elsewhere classified: Secondary | ICD-10-CM | POA: Diagnosis not present

## 2017-02-28 ENCOUNTER — Other Ambulatory Visit (HOSPITAL_COMMUNITY): Payer: Self-pay | Admitting: Gastroenterology

## 2017-02-28 DIAGNOSIS — B1920 Unspecified viral hepatitis C without hepatic coma: Secondary | ICD-10-CM

## 2017-02-28 DIAGNOSIS — K7460 Unspecified cirrhosis of liver: Secondary | ICD-10-CM

## 2017-02-28 DIAGNOSIS — R188 Other ascites: Secondary | ICD-10-CM

## 2017-03-02 ENCOUNTER — Other Ambulatory Visit (HOSPITAL_COMMUNITY): Payer: Self-pay | Admitting: *Deleted

## 2017-03-02 ENCOUNTER — Other Ambulatory Visit (HOSPITAL_COMMUNITY): Payer: Self-pay | Admitting: Gastroenterology

## 2017-03-02 ENCOUNTER — Ambulatory Visit (HOSPITAL_COMMUNITY)
Admission: RE | Admit: 2017-03-02 | Discharge: 2017-03-02 | Disposition: A | Discharge status: Home / Self Care | Payer: Medicare Other | Source: Ambulatory Visit | Attending: Gastroenterology | Admitting: Gastroenterology

## 2017-03-02 ENCOUNTER — Encounter (HOSPITAL_COMMUNITY): Payer: Self-pay | Admitting: Student

## 2017-03-02 DIAGNOSIS — B1920 Unspecified viral hepatitis C without hepatic coma: Secondary | ICD-10-CM

## 2017-03-02 DIAGNOSIS — K7460 Unspecified cirrhosis of liver: Secondary | ICD-10-CM

## 2017-03-02 DIAGNOSIS — R188 Other ascites: Secondary | ICD-10-CM

## 2017-03-02 HISTORY — PX: IR GENERIC HISTORICAL: IMG1180011

## 2017-03-02 LAB — BODY FLUID CELL COUNT WITH DIFFERENTIAL
EOS FL: 0 %
Lymphs, Fluid: 23 %
MONOCYTE-MACROPHAGE-SEROUS FLUID: 76 % (ref 50–90)
NEUTROPHIL FLUID: 1 % (ref 0–25)
WBC FLUID: 158 uL (ref 0–1000)

## 2017-03-02 MED ORDER — LIDOCAINE HCL (PF) 1 % IJ SOLN
INTRAMUSCULAR | Status: DC
Start: 2017-03-02 — End: 2017-03-03
  Filled 2017-03-02: qty 10

## 2017-03-02 MED ORDER — ALBUMIN HUMAN 25 % IV SOLN
50.0000 g | Freq: Once | INTRAVENOUS | Status: AC
  Administered 2017-03-02: 50 g via INTRAVENOUS
  Filled 2017-03-02: qty 200

## 2017-03-02 NOTE — Procedures (Signed)
PROCEDURE SUMMARY:  Successful US guided paracentesis from right lateral abdomen.  Yielded 4.8L of hazy, yellow fluid.  No immediate complications.  Pt tolerated well.   Specimen was sent for labs.  [name] PA-C 03/02/2017 2:53 PM

## 2017-03-02 NOTE — Progress Notes (Signed)
4.8 liters yellow drainage extracted.

## 2017-03-03 LAB — PATHOLOGIST SMEAR REVIEW

## 2017-03-31 ENCOUNTER — Other Ambulatory Visit (HOSPITAL_COMMUNITY): Payer: Self-pay | Admitting: Gastroenterology

## 2017-03-31 DIAGNOSIS — R188 Other ascites: Secondary | ICD-10-CM

## 2017-03-31 DIAGNOSIS — B1920 Unspecified viral hepatitis C without hepatic coma: Secondary | ICD-10-CM

## 2017-03-31 DIAGNOSIS — K7460 Unspecified cirrhosis of liver: Secondary | ICD-10-CM

## 2017-04-01 ENCOUNTER — Ambulatory Visit (HOSPITAL_COMMUNITY)
Admission: RE | Admit: 2017-04-01 | Discharge: 2017-04-01 | Disposition: A | Discharge status: Home / Self Care | Payer: Medicare Other | Source: Ambulatory Visit | Attending: Gastroenterology | Admitting: Gastroenterology

## 2017-04-01 DIAGNOSIS — B1920 Unspecified viral hepatitis C without hepatic coma: Secondary | ICD-10-CM

## 2017-04-01 DIAGNOSIS — K7460 Unspecified cirrhosis of liver: Secondary | ICD-10-CM

## 2017-04-01 DIAGNOSIS — R188 Other ascites: Secondary | ICD-10-CM | POA: Insufficient documentation

## 2017-04-01 HISTORY — PX: IR PARACENTESIS: IMG2679

## 2017-04-01 MED ORDER — LIDOCAINE HCL 1 % IJ SOLN
INTRAMUSCULAR | Status: AC
  Filled 2017-04-01: qty 20

## 2017-04-01 MED ORDER — LIDOCAINE HCL 1 % IJ SOLN
INTRAMUSCULAR | Status: DC | PRN
  Administered 2017-04-01: 10 mL

## 2017-04-01 NOTE — Procedures (Signed)
PROCEDURE SUMMARY:  Successful US guided paracentesis from right lateral abdomen.  Yielded 3.1 liters of clear, yellow fluid.  No immediate complications.  Pt tolerated well.   Specimen was not sent for labs.  [name] PA-C 04/01/2017 12:26 PM

## 2017-04-02 ENCOUNTER — Encounter (HOSPITAL_COMMUNITY): Payer: Self-pay | Admitting: Student

## 2017-05-06 ENCOUNTER — Other Ambulatory Visit (HOSPITAL_COMMUNITY): Payer: Self-pay | Admitting: Gastroenterology

## 2017-05-06 DIAGNOSIS — K7460 Unspecified cirrhosis of liver: Secondary | ICD-10-CM

## 2017-05-06 DIAGNOSIS — R188 Other ascites: Secondary | ICD-10-CM

## 2017-05-06 DIAGNOSIS — B1920 Unspecified viral hepatitis C without hepatic coma: Secondary | ICD-10-CM

## 2017-05-10 ENCOUNTER — Encounter (HOSPITAL_COMMUNITY): Payer: Self-pay | Admitting: General Surgery

## 2017-05-10 ENCOUNTER — Ambulatory Visit (HOSPITAL_COMMUNITY)
Admission: RE | Admit: 2017-05-10 | Discharge: 2017-05-10 | Disposition: A | Discharge status: Home / Self Care | Payer: Medicare Other | Source: Ambulatory Visit | Attending: Gastroenterology | Admitting: Gastroenterology

## 2017-05-10 DIAGNOSIS — B1920 Unspecified viral hepatitis C without hepatic coma: Secondary | ICD-10-CM

## 2017-05-10 DIAGNOSIS — K7031 Alcoholic cirrhosis of liver with ascites: Secondary | ICD-10-CM | POA: Insufficient documentation

## 2017-05-10 DIAGNOSIS — K7460 Unspecified cirrhosis of liver: Secondary | ICD-10-CM

## 2017-05-10 DIAGNOSIS — R188 Other ascites: Secondary | ICD-10-CM

## 2017-05-10 HISTORY — PX: IR PARACENTESIS: IMG2679

## 2017-05-10 MED ORDER — LIDOCAINE HCL 1 % IJ SOLN
INTRAMUSCULAR | Status: AC
  Filled 2017-05-10: qty 20

## 2017-05-10 MED ORDER — ALBUMIN HUMAN 25 % IV SOLN
50.0000 g | Freq: Once | INTRAVENOUS | Status: AC
  Administered 2017-05-10: 50 g via INTRAVENOUS
  Filled 2017-05-10: qty 200

## 2017-05-10 MED ORDER — LIDOCAINE HCL 1 % IJ SOLN
INTRAMUSCULAR | Status: DC | PRN
  Administered 2017-05-10: 10 mL

## 2017-05-10 NOTE — Procedures (Signed)
Ultrasound-guided therapeutic paracentesis performed yielding 4.7 liters of serous colored fluid. No immediate complications.  Adrian Compton E 11:00 AM 05/10/2017

## 2017-05-30 ENCOUNTER — Other Ambulatory Visit (HOSPITAL_COMMUNITY): Payer: Self-pay | Admitting: Gastroenterology

## 2017-05-30 DIAGNOSIS — R188 Other ascites: Secondary | ICD-10-CM

## 2017-06-06 ENCOUNTER — Encounter (HOSPITAL_COMMUNITY): Payer: Self-pay | Admitting: Interventional Radiology

## 2017-06-06 ENCOUNTER — Ambulatory Visit (HOSPITAL_COMMUNITY)
Admission: RE | Admit: 2017-06-06 | Discharge: 2017-06-06 | Disposition: A | Discharge status: Home / Self Care | Payer: Medicare Other | Source: Ambulatory Visit | Attending: Gastroenterology | Admitting: Gastroenterology

## 2017-06-06 DIAGNOSIS — R188 Other ascites: Secondary | ICD-10-CM | POA: Insufficient documentation

## 2017-06-06 HISTORY — PX: IR PARACENTESIS: IMG2679

## 2017-06-06 MED ORDER — LIDOCAINE HCL (PF) 1 % IJ SOLN
INTRAMUSCULAR | Status: AC
  Filled 2017-06-06: qty 30

## 2017-06-06 MED ORDER — LIDOCAINE HCL 1 % IJ SOLN
INTRAMUSCULAR | Status: DC | PRN
  Administered 2017-06-06: 10 mL

## 2017-06-06 NOTE — Procedures (Signed)
PROCEDURE SUMMARY:  Successful US guided paracentesis from right lateral abdomen.  Yielded 3.0 liters of yellow fluid.  No immediate complications.  Pt tolerated well.   Specimen was not sent for labs.  [name] PA-C 06/06/2017 11:10 AM

## 2017-07-15 ENCOUNTER — Other Ambulatory Visit (HOSPITAL_COMMUNITY): Payer: Self-pay | Admitting: Gastroenterology

## 2017-07-15 DIAGNOSIS — R188 Other ascites: Secondary | ICD-10-CM

## 2017-07-18 ENCOUNTER — Encounter (HOSPITAL_COMMUNITY): Payer: Self-pay | Admitting: Radiology

## 2017-07-18 ENCOUNTER — Ambulatory Visit (HOSPITAL_COMMUNITY)
Admission: RE | Admit: 2017-07-18 | Discharge: 2017-07-18 | Disposition: A | Discharge status: Home / Self Care | Payer: Medicare Other | Source: Ambulatory Visit | Attending: Gastroenterology | Admitting: Gastroenterology

## 2017-07-18 DIAGNOSIS — K7460 Unspecified cirrhosis of liver: Secondary | ICD-10-CM | POA: Insufficient documentation

## 2017-07-18 DIAGNOSIS — R188 Other ascites: Secondary | ICD-10-CM | POA: Insufficient documentation

## 2017-07-18 HISTORY — PX: IR PARACENTESIS: IMG2679

## 2017-07-18 MED ORDER — LIDOCAINE HCL (PF) 1 % IJ SOLN
INTRAMUSCULAR | Status: AC
  Filled 2017-07-18: qty 30

## 2017-07-18 MED ORDER — LIDOCAINE HCL (PF) 1 % IJ SOLN
INTRAMUSCULAR | Status: DC | PRN
  Administered 2017-07-18: 5 mL

## 2017-07-18 NOTE — Procedures (Signed)
PROCEDURE SUMMARY:  Successful US guided paracentesis from RLQ.  Yielded 4.3 L of clear yellow fluid.  No immediate complications.  Pt tolerated well.   Specimen was not sent for labs.  Ascencion Dike PA-C 07/18/2017 10:29 AM

## 2017-08-09 ENCOUNTER — Other Ambulatory Visit (HOSPITAL_COMMUNITY): Payer: Self-pay | Admitting: Gastroenterology

## 2017-08-09 DIAGNOSIS — R188 Other ascites: Secondary | ICD-10-CM

## 2017-08-12 ENCOUNTER — Encounter (HOSPITAL_COMMUNITY): Payer: Self-pay | Admitting: Interventional Radiology

## 2017-08-12 ENCOUNTER — Ambulatory Visit (HOSPITAL_COMMUNITY)
Admission: RE | Admit: 2017-08-12 | Discharge: 2017-08-12 | Disposition: A | Discharge status: Home / Self Care | Payer: Medicare Other | Source: Ambulatory Visit | Attending: Gastroenterology | Admitting: Gastroenterology

## 2017-08-12 DIAGNOSIS — K7031 Alcoholic cirrhosis of liver with ascites: Secondary | ICD-10-CM | POA: Insufficient documentation

## 2017-08-12 DIAGNOSIS — R188 Other ascites: Secondary | ICD-10-CM

## 2017-08-12 HISTORY — PX: IR PARACENTESIS: IMG2679

## 2017-08-12 MED ORDER — LIDOCAINE HCL 1 % IJ SOLN
INTRAMUSCULAR | Status: DC | PRN
  Administered 2017-08-12: 10 mL

## 2017-08-12 MED ORDER — LIDOCAINE HCL (PF) 1 % IJ SOLN
INTRAMUSCULAR | Status: AC
  Filled 2017-08-12: qty 30

## 2017-08-12 NOTE — Procedures (Signed)
Ultrasound-guided therapeutic paracentesis performed yielding 3.9 liters of serous colored fluid. No immediate complications.  Olon Russ E 11:18 AM 08/12/2017

## 2017-09-20 ENCOUNTER — Other Ambulatory Visit (HOSPITAL_COMMUNITY): Payer: Self-pay | Admitting: Gastroenterology

## 2017-09-20 DIAGNOSIS — R188 Other ascites: Secondary | ICD-10-CM

## 2017-09-20 DIAGNOSIS — K7460 Unspecified cirrhosis of liver: Secondary | ICD-10-CM

## 2017-09-22 ENCOUNTER — Encounter (HOSPITAL_COMMUNITY): Payer: Self-pay | Admitting: Student

## 2017-09-22 ENCOUNTER — Ambulatory Visit (HOSPITAL_COMMUNITY)
Admission: RE | Admit: 2017-09-22 | Discharge: 2017-09-22 | Disposition: A | Discharge status: Home / Self Care | Payer: Medicare Other | Source: Ambulatory Visit | Attending: Gastroenterology | Admitting: Gastroenterology

## 2017-09-22 DIAGNOSIS — K7460 Unspecified cirrhosis of liver: Secondary | ICD-10-CM

## 2017-09-22 DIAGNOSIS — K7031 Alcoholic cirrhosis of liver with ascites: Secondary | ICD-10-CM | POA: Diagnosis not present

## 2017-09-22 DIAGNOSIS — R188 Other ascites: Secondary | ICD-10-CM

## 2017-09-22 HISTORY — PX: IR PARACENTESIS: IMG2679

## 2017-09-22 MED ORDER — LIDOCAINE 2% (20 MG/ML) 5 ML SYRINGE
INTRAMUSCULAR | Status: AC
  Filled 2017-09-22: qty 10

## 2017-09-22 MED ORDER — LIDOCAINE HCL (PF) 1 % IJ SOLN
INTRAMUSCULAR | Status: DC | PRN
  Administered 2017-09-22: 10 mL

## 2017-09-22 NOTE — Procedures (Signed)
PROCEDURE SUMMARY:  Successful US guided paracentesis from right lateral abdomen.  Yielded 3.9 liters of yellow fluid.  No immediate complications.  Pt tolerated well.   Specimen was not sent for labs.  [name] PA-C 09/22/2017 2:58 PM

## 2017-11-02 ENCOUNTER — Other Ambulatory Visit (HOSPITAL_COMMUNITY): Payer: Self-pay | Admitting: Gastroenterology

## 2017-11-02 DIAGNOSIS — K7031 Alcoholic cirrhosis of liver with ascites: Secondary | ICD-10-CM

## 2017-11-02 DIAGNOSIS — D696 Thrombocytopenia, unspecified: Secondary | ICD-10-CM

## 2017-11-04 ENCOUNTER — Ambulatory Visit (HOSPITAL_COMMUNITY)
Admission: RE | Admit: 2017-11-04 | Discharge: 2017-11-04 | Disposition: A | Discharge status: Home / Self Care | Payer: Medicare Other | Source: Ambulatory Visit | Attending: Gastroenterology | Admitting: Gastroenterology

## 2017-11-04 ENCOUNTER — Encounter (HOSPITAL_COMMUNITY): Payer: Self-pay | Admitting: Radiology

## 2017-11-04 DIAGNOSIS — K7031 Alcoholic cirrhosis of liver with ascites: Secondary | ICD-10-CM

## 2017-11-04 DIAGNOSIS — R161 Splenomegaly, not elsewhere classified: Secondary | ICD-10-CM | POA: Diagnosis not present

## 2017-11-04 DIAGNOSIS — R188 Other ascites: Secondary | ICD-10-CM | POA: Insufficient documentation

## 2017-11-04 DIAGNOSIS — D696 Thrombocytopenia, unspecified: Secondary | ICD-10-CM | POA: Diagnosis present

## 2017-11-04 HISTORY — PX: IR PARACENTESIS: IMG2679

## 2017-11-04 MED ORDER — ALBUMIN HUMAN 25 % IV SOLN
50.0000 g | Freq: Once | INTRAVENOUS | Status: AC
  Administered 2017-11-04: 50 g via INTRAVENOUS
  Filled 2017-11-04: qty 200

## 2017-11-04 MED ORDER — LIDOCAINE 2% (20 MG/ML) 5 ML SYRINGE
INTRAMUSCULAR | Status: AC
  Filled 2017-11-04: qty 10

## 2017-11-04 NOTE — Procedures (Signed)
PROCEDURE SUMMARY:  Successful US guided paracentesis from LLQ.  Yielded 5 L of clear yellow fluid.  No immediate complications.  Pt tolerated well.   Specimen was not sent for labs.  Ascencion Dike PA-C 11/04/2017 10:25 AM

## 2018-01-02 ENCOUNTER — Other Ambulatory Visit (HOSPITAL_COMMUNITY): Payer: Self-pay | Admitting: Gastroenterology

## 2018-01-02 DIAGNOSIS — R188 Other ascites: Secondary | ICD-10-CM

## 2018-01-05 ENCOUNTER — Ambulatory Visit (HOSPITAL_COMMUNITY)
Admission: RE | Admit: 2018-01-05 | Discharge: 2018-01-05 | Disposition: A | Discharge status: Home / Self Care | Payer: Medicare Other | Source: Ambulatory Visit | Attending: Gastroenterology | Admitting: Gastroenterology

## 2018-01-05 ENCOUNTER — Encounter (HOSPITAL_COMMUNITY): Payer: Self-pay | Admitting: Radiology

## 2018-01-05 DIAGNOSIS — R188 Other ascites: Secondary | ICD-10-CM | POA: Diagnosis not present

## 2018-01-05 HISTORY — PX: IR PARACENTESIS: IMG2679

## 2018-01-05 MED ORDER — ALBUMIN HUMAN 25 % IV SOLN
50.0000 g | Freq: Once | INTRAVENOUS | Status: AC
  Administered 2018-01-05: 50 g via INTRAVENOUS
  Filled 2018-01-05: qty 200

## 2018-01-05 MED ORDER — LIDOCAINE 2% (20 MG/ML) 5 ML SYRINGE
INTRAMUSCULAR | Status: AC
  Filled 2018-01-05: qty 10

## 2018-01-05 NOTE — Procedures (Signed)
PROCEDURE SUMMARY:  Successful US guided paracentesis from RLQ.  Yielded 5.2 L of clear yellow fluid.  No immediate complications.  Pt tolerated well.   Specimen was not sent for labs.  Ascencion Dike PA-C 01/05/2018 11:41 AM

## 2018-02-08 ENCOUNTER — Other Ambulatory Visit (HOSPITAL_COMMUNITY): Payer: Self-pay | Admitting: Gastroenterology

## 2018-02-08 DIAGNOSIS — K7460 Unspecified cirrhosis of liver: Secondary | ICD-10-CM

## 2018-02-08 DIAGNOSIS — R188 Other ascites: Secondary | ICD-10-CM

## 2018-02-08 DIAGNOSIS — B182 Chronic viral hepatitis C: Secondary | ICD-10-CM

## 2018-02-10 ENCOUNTER — Ambulatory Visit (HOSPITAL_COMMUNITY)
Admission: RE | Admit: 2018-02-10 | Discharge: 2018-02-10 | Disposition: A | Discharge status: Home / Self Care | Payer: Medicare Other | Source: Ambulatory Visit | Attending: Gastroenterology | Admitting: Gastroenterology

## 2018-02-10 ENCOUNTER — Encounter (HOSPITAL_COMMUNITY): Payer: Self-pay | Admitting: Physician Assistant

## 2018-02-10 DIAGNOSIS — K7460 Unspecified cirrhosis of liver: Secondary | ICD-10-CM

## 2018-02-10 DIAGNOSIS — R188 Other ascites: Secondary | ICD-10-CM

## 2018-02-10 DIAGNOSIS — B1920 Unspecified viral hepatitis C without hepatic coma: Secondary | ICD-10-CM | POA: Diagnosis not present

## 2018-02-10 DIAGNOSIS — B182 Chronic viral hepatitis C: Secondary | ICD-10-CM

## 2018-02-10 DIAGNOSIS — K7030 Alcoholic cirrhosis of liver without ascites: Secondary | ICD-10-CM | POA: Diagnosis present

## 2018-02-10 HISTORY — PX: IR PARACENTESIS: IMG2679

## 2018-02-10 MED ORDER — LIDOCAINE HCL (PF) 2 % IJ SOLN
INTRAMUSCULAR | Status: AC
  Filled 2018-02-10: qty 10

## 2018-02-10 NOTE — Procedures (Signed)
PROCEDURE SUMMARY:  Successful US guided paracentesis from right lateral abdomen.  Yielded 3.6 liters of clear yellow fluid.   Procedure was complicated by some bleeding at the peritoneum into the abdominal cavity. Pressure was held for 10 minutes. Repeat ultrasound showed the bleeding had stopped. Dr. Vernard Gambles was present during repeat ultrasound and confirmed hemostasis Paracentesis was repeated which initially yielded some blood tinged fluid which did clear. Patient procedure tolerated well.   Patient was monitored for 1 hour post procedure.  His blood pressure remained stable and felt well upon discharge.  Rafan Sanders S Aleksi Brummet PA-C 02/10/2018 2:38 PM

## 2018-02-14 ENCOUNTER — Other Ambulatory Visit (HOSPITAL_COMMUNITY): Payer: Self-pay | Admitting: Gastroenterology

## 2018-02-14 DIAGNOSIS — K7682 Hepatic encephalopathy: Secondary | ICD-10-CM

## 2018-02-14 DIAGNOSIS — K7290 Hepatic failure, unspecified without coma: Secondary | ICD-10-CM

## 2018-02-14 DIAGNOSIS — K7460 Unspecified cirrhosis of liver: Secondary | ICD-10-CM

## 2018-02-23 ENCOUNTER — Encounter (HOSPITAL_COMMUNITY): Payer: Self-pay | Admitting: Radiology

## 2018-02-23 ENCOUNTER — Ambulatory Visit (HOSPITAL_COMMUNITY)
Admission: RE | Admit: 2018-02-23 | Discharge: 2018-02-23 | Disposition: A | Discharge status: Home / Self Care | Payer: Medicare Other | Source: Ambulatory Visit | Attending: Gastroenterology | Admitting: Gastroenterology

## 2018-02-23 DIAGNOSIS — B1920 Unspecified viral hepatitis C without hepatic coma: Secondary | ICD-10-CM | POA: Diagnosis not present

## 2018-02-23 DIAGNOSIS — R188 Other ascites: Secondary | ICD-10-CM | POA: Insufficient documentation

## 2018-02-23 DIAGNOSIS — K7460 Unspecified cirrhosis of liver: Secondary | ICD-10-CM | POA: Diagnosis not present

## 2018-02-23 DIAGNOSIS — K7682 Hepatic encephalopathy: Secondary | ICD-10-CM

## 2018-02-23 DIAGNOSIS — K7290 Hepatic failure, unspecified without coma: Secondary | ICD-10-CM

## 2018-02-23 HISTORY — PX: IR PARACENTESIS: IMG2679

## 2018-02-23 LAB — BODY FLUID CELL COUNT WITH DIFFERENTIAL
Lymphs, Fluid: 24 %
Monocyte-Macrophage-Serous Fluid: 75 % (ref 50–90)
NEUTROPHIL FLUID: 1 % (ref 0–25)
Total Nucleated Cell Count, Fluid: 287 cu mm (ref 0–1000)

## 2018-02-23 MED ORDER — LIDOCAINE HCL (PF) 1 % IJ SOLN
INTRAMUSCULAR | Status: AC
  Filled 2018-02-23: qty 30

## 2018-02-23 MED ORDER — LIDOCAINE HCL (PF) 1 % IJ SOLN
INTRAMUSCULAR | Status: AC | PRN
  Administered 2018-02-23: 10 mL

## 2018-02-23 MED ORDER — ALBUMIN HUMAN 25 % IV SOLN
50.0000 g | Freq: Once | INTRAVENOUS | Status: AC
  Administered 2018-02-23: 50 g via INTRAVENOUS
  Filled 2018-02-23: qty 200

## 2018-02-23 NOTE — Procedures (Signed)
PROCEDURE SUMMARY:  Successful US guided paracentesis from LLQ.  Yielded 5.1 L of blood tinged/amber fluid.  No immediate complications.  Pt tolerated well.   Specimen was sent for labs.  Ascencion Dike PA-C 02/23/2018 2:11 PM

## 2018-02-24 LAB — PATHOLOGIST SMEAR REVIEW

## 2018-03-14 ENCOUNTER — Other Ambulatory Visit (HOSPITAL_COMMUNITY): Payer: Self-pay | Admitting: Gastroenterology

## 2018-03-14 DIAGNOSIS — R188 Other ascites: Secondary | ICD-10-CM

## 2018-03-16 ENCOUNTER — Ambulatory Visit (HOSPITAL_COMMUNITY)
Admission: RE | Admit: 2018-03-16 | Discharge: 2018-03-16 | Disposition: A | Discharge status: Home / Self Care | Payer: Medicare Other | Source: Ambulatory Visit | Attending: Gastroenterology | Admitting: Gastroenterology

## 2018-03-16 ENCOUNTER — Encounter (HOSPITAL_COMMUNITY): Payer: Self-pay

## 2018-03-16 DIAGNOSIS — K7460 Unspecified cirrhosis of liver: Secondary | ICD-10-CM | POA: Insufficient documentation

## 2018-03-16 DIAGNOSIS — K7290 Hepatic failure, unspecified without coma: Secondary | ICD-10-CM | POA: Insufficient documentation

## 2018-03-16 DIAGNOSIS — R188 Other ascites: Secondary | ICD-10-CM | POA: Diagnosis not present

## 2018-03-16 HISTORY — PX: IR PARACENTESIS: IMG2679

## 2018-03-16 MED ORDER — ALBUMIN HUMAN 25 % IV SOLN
50.0000 g | Freq: Once | INTRAVENOUS | Status: AC
  Administered 2018-03-16: 50 g via INTRAVENOUS
  Filled 2018-03-16: qty 200

## 2018-03-16 MED ORDER — LIDOCAINE HCL (PF) 2 % IJ SOLN
INTRAMUSCULAR | Status: AC
  Filled 2018-03-16: qty 10

## 2018-03-16 MED ORDER — LIDOCAINE HCL (PF) 2 % IJ SOLN
INTRAMUSCULAR | Status: AC | PRN
  Administered 2018-03-16: 10 mL

## 2018-03-16 NOTE — Procedures (Signed)
PROCEDURE SUMMARY:  Successful US guided paracentesis from RLQ.  Yielded 5.1 L of clear yellow fluid.  No immediate complications.  Pt tolerated well.   Specimen was not sent for labs.  Ascencion Dike PA-C 03/16/2018 11:11 AM

## 2018-03-27 ENCOUNTER — Other Ambulatory Visit (HOSPITAL_COMMUNITY): Payer: Self-pay | Admitting: Gastroenterology

## 2018-03-27 DIAGNOSIS — R188 Other ascites: Secondary | ICD-10-CM

## 2018-03-28 ENCOUNTER — Emergency Department (HOSPITAL_COMMUNITY): Payer: Medicare Other

## 2018-03-28 ENCOUNTER — Inpatient Hospital Stay (HOSPITAL_COMMUNITY)
Admission: EM | Admit: 2018-03-28 | Discharge: 2018-03-30 | DRG: 607 | Disposition: A | Discharge status: Home / Self Care | Payer: Medicare Other | Attending: Internal Medicine | Admitting: Internal Medicine

## 2018-03-28 ENCOUNTER — Other Ambulatory Visit: Payer: Self-pay

## 2018-03-28 ENCOUNTER — Encounter (HOSPITAL_COMMUNITY): Payer: Self-pay

## 2018-03-28 DIAGNOSIS — G8929 Other chronic pain: Secondary | ICD-10-CM | POA: Diagnosis present

## 2018-03-28 DIAGNOSIS — Z66 Do not resuscitate: Secondary | ICD-10-CM | POA: Diagnosis present

## 2018-03-28 DIAGNOSIS — R0781 Pleurodynia: Secondary | ICD-10-CM | POA: Diagnosis present

## 2018-03-28 DIAGNOSIS — Z6826 Body mass index (BMI) 26.0-26.9, adult: Secondary | ICD-10-CM | POA: Diagnosis not present

## 2018-03-28 DIAGNOSIS — M545 Low back pain, unspecified: Secondary | ICD-10-CM | POA: Diagnosis present

## 2018-03-28 DIAGNOSIS — R188 Other ascites: Secondary | ICD-10-CM | POA: Diagnosis present

## 2018-03-28 DIAGNOSIS — I10 Essential (primary) hypertension: Secondary | ICD-10-CM | POA: Diagnosis present

## 2018-03-28 DIAGNOSIS — B182 Chronic viral hepatitis C: Secondary | ICD-10-CM | POA: Diagnosis present

## 2018-03-28 DIAGNOSIS — R634 Abnormal weight loss: Secondary | ICD-10-CM | POA: Diagnosis present

## 2018-03-28 DIAGNOSIS — K7460 Unspecified cirrhosis of liver: Secondary | ICD-10-CM | POA: Diagnosis present

## 2018-03-28 DIAGNOSIS — J9859 Other diseases of mediastinum, not elsewhere classified: Secondary | ICD-10-CM | POA: Diagnosis not present

## 2018-03-28 DIAGNOSIS — Z87891 Personal history of nicotine dependence: Secondary | ICD-10-CM | POA: Diagnosis not present

## 2018-03-28 DIAGNOSIS — R161 Splenomegaly, not elsewhere classified: Secondary | ICD-10-CM | POA: Diagnosis present

## 2018-03-28 DIAGNOSIS — R490 Dysphonia: Secondary | ICD-10-CM | POA: Diagnosis present

## 2018-03-28 DIAGNOSIS — Z96651 Presence of right artificial knee joint: Secondary | ICD-10-CM | POA: Diagnosis present

## 2018-03-28 DIAGNOSIS — Z79899 Other long term (current) drug therapy: Secondary | ICD-10-CM | POA: Diagnosis not present

## 2018-03-28 DIAGNOSIS — B1920 Unspecified viral hepatitis C without hepatic coma: Secondary | ICD-10-CM | POA: Diagnosis present

## 2018-03-28 DIAGNOSIS — R222 Localized swelling, mass and lump, trunk: Secondary | ICD-10-CM | POA: Diagnosis present

## 2018-03-28 DIAGNOSIS — R609 Edema, unspecified: Secondary | ICD-10-CM | POA: Diagnosis not present

## 2018-03-28 DIAGNOSIS — D696 Thrombocytopenia, unspecified: Secondary | ICD-10-CM | POA: Diagnosis present

## 2018-03-28 DIAGNOSIS — Z7682 Awaiting organ transplant status: Secondary | ICD-10-CM

## 2018-03-28 DIAGNOSIS — R0602 Shortness of breath: Secondary | ICD-10-CM | POA: Diagnosis not present

## 2018-03-28 HISTORY — DX: Low back pain: M54.5

## 2018-03-28 HISTORY — DX: Other chronic pain: G89.29

## 2018-03-28 HISTORY — DX: Low back pain, unspecified: M54.50

## 2018-03-28 LAB — CBC
HCT: 39.3 % (ref 39.0–52.0)
Hemoglobin: 13.7 g/dL (ref 13.0–17.0)
MCH: 30.5 pg (ref 26.0–34.0)
MCHC: 34.9 g/dL (ref 30.0–36.0)
MCV: 87.5 fL (ref 78.0–100.0)
Platelets: 43 10*3/uL — ABNORMAL LOW (ref 150–400)
RBC: 4.49 MIL/uL (ref 4.22–5.81)
RDW: 15.1 % (ref 11.5–15.5)
WBC: 4.3 10*3/uL (ref 4.0–10.5)

## 2018-03-28 LAB — BASIC METABOLIC PANEL
ANION GAP: 10 (ref 5–15)
BUN: 8 mg/dL (ref 6–20)
CALCIUM: 9.3 mg/dL (ref 8.9–10.3)
CO2: 21 mmol/L — ABNORMAL LOW (ref 22–32)
Chloride: 105 mmol/L (ref 101–111)
Creatinine, Ser: 0.93 mg/dL (ref 0.61–1.24)
Glucose, Bld: 134 mg/dL — ABNORMAL HIGH (ref 65–99)
Potassium: 4 mmol/L (ref 3.5–5.1)
SODIUM: 136 mmol/L (ref 135–145)

## 2018-03-28 LAB — D-DIMER, QUANTITATIVE (NOT AT ARMC): D DIMER QUANT: 2.37 ug{FEU}/mL — AB (ref 0.00–0.50)

## 2018-03-28 LAB — PREALBUMIN: Prealbumin: 12.3 mg/dL — ABNORMAL LOW (ref 18–38)

## 2018-03-28 LAB — LACTATE DEHYDROGENASE: LDH: 122 U/L (ref 98–192)

## 2018-03-28 LAB — TROPONIN I: Troponin I: <0.03 ng/mL (ref <0.03)

## 2018-03-28 LAB — PHOSPHORUS: PHOSPHORUS: 2.8 mg/dL (ref 2.5–4.6)

## 2018-03-28 LAB — MAGNESIUM: MAGNESIUM: 1.7 mg/dL (ref 1.7–2.4)

## 2018-03-28 MED ORDER — SODIUM CHLORIDE 0.9 % IV SOLN
INTRAVENOUS | Status: DC
  Administered 2018-03-28: 22:00:00 via INTRAVENOUS

## 2018-03-28 MED ORDER — ACETAMINOPHEN 650 MG RE SUPP: 650.0000 mg | Freq: Four times a day (QID) | RECTAL | Status: DC | PRN

## 2018-03-28 MED ORDER — ACETAMINOPHEN 325 MG PO TABS: 650.0000 mg | ORAL_TABLET | Freq: Four times a day (QID) | ORAL | Status: DC | PRN

## 2018-03-28 MED ORDER — SERTRALINE HCL 100 MG PO TABS
100.0000 mg | ORAL_TABLET | Freq: Every day | ORAL | Status: DC
  Administered 2018-03-28 – 2018-03-30 (×3): 100 mg via ORAL
  Filled 2018-03-28 (×4): qty 1

## 2018-03-28 MED ORDER — IOPAMIDOL (ISOVUE-370) INJECTION 76%
INTRAVENOUS | Status: AC
  Filled 2018-03-28: qty 100

## 2018-03-28 MED ORDER — ALBUTEROL SULFATE (2.5 MG/3ML) 0.083% IN NEBU: 3.0000 mL | INHALATION_SOLUTION | Freq: Four times a day (QID) | RESPIRATORY_TRACT | Status: DC | PRN

## 2018-03-28 MED ORDER — LIDOCAINE 5 % EX PTCH
1.0000 | MEDICATED_PATCH | CUTANEOUS | Status: DC
  Administered 2018-03-28 – 2018-03-30 (×3): 1 via TRANSDERMAL
  Filled 2018-03-28 (×3): qty 1

## 2018-03-28 MED ORDER — ONDANSETRON HCL 4 MG PO TABS: 4.0000 mg | ORAL_TABLET | Freq: Four times a day (QID) | ORAL | Status: DC | PRN

## 2018-03-28 MED ORDER — ONDANSETRON HCL 4 MG/2ML IJ SOLN: 4.0000 mg | Freq: Four times a day (QID) | INTRAMUSCULAR | Status: DC | PRN

## 2018-03-28 MED ORDER — HYDROCODONE-ACETAMINOPHEN 10-325 MG PO TABS
1.0000 | ORAL_TABLET | ORAL | Status: DC | PRN
  Administered 2018-03-28: 1 via ORAL
  Filled 2018-03-28: qty 1

## 2018-03-28 MED ORDER — FENTANYL CITRATE (PF) 100 MCG/2ML IJ SOLN
25.0000 ug | Freq: Once | INTRAMUSCULAR | Status: AC
  Administered 2018-03-28: 25 ug via INTRAVENOUS
  Filled 2018-03-28: qty 2

## 2018-03-28 MED ORDER — FENTANYL CITRATE (PF) 100 MCG/2ML IJ SOLN
50.0000 ug | Freq: Once | INTRAMUSCULAR | Status: AC
Start: 2018-03-28 — End: 2018-03-28
  Administered 2018-03-28: 50 ug via INTRAVENOUS
  Filled 2018-03-28: qty 2

## 2018-03-28 MED ORDER — MORPHINE SULFATE (PF) 4 MG/ML IV SOLN
1.0000 mg | INTRAVENOUS | Status: DC | PRN
  Administered 2018-03-28: 2 mg via INTRAVENOUS
  Administered 2018-03-28 – 2018-03-30 (×14): 4 mg via INTRAVENOUS
  Filled 2018-03-28 (×15): qty 1

## 2018-03-28 MED ORDER — IOPAMIDOL (ISOVUE-370) INJECTION 76%
100.0000 mL | Freq: Once | INTRAVENOUS | Status: AC | PRN
  Administered 2018-03-28: 100 mL via INTRAVENOUS

## 2018-03-28 MED ORDER — SODIUM CHLORIDE 0.9% FLUSH
3.0000 mL | Freq: Two times a day (BID) | INTRAVENOUS | Status: DC
  Administered 2018-03-29 – 2018-03-30 (×3): 3 mL via INTRAVENOUS

## 2018-03-28 NOTE — ED Notes (Signed)
Attempted report 

## 2018-03-28 NOTE — H&P (Signed)
Patient seen and examined independently by me. I agree with the chief complaint, history, ROS, physical exam, radiology findings, assessment and plan as Documented. Anything additional is noted in my addendum.   62 year old with history of hepatitis C cirrhosis, tobacco user, bilateral renal stenosis status post stenting, currently on transplant list, thrombocytopenia, ascites with Recurrent paracentesis, not taking any home medications, depression comes to the hospital with complaints of atypical left-sided chest pain for the last several days along with sore throat and change in his voice for last 10 weeks.  Patient states he is also has some exertional shortness of breath but thinks it could be related to his abdominal distention.  Denies any dizziness, palpitations, syncope and any other symptoms.  Denies any history of lung masses in the past or any other complaints.  Was treated for hep C originally 2004 but was not and fully remission then about 3 years ago he was treated completely with Harvoni.  Since then he thinks he has been cured for hepatitis C.  HEENT/EYES = negative for pain, redness, loss of vision, double vision, blurred vision, loss of hearing, sore throat, hoarseness, dysphagia Cardiovascular= negative for chest pain, palpitation, murmurs, lower extremity swelling Respiratory/lungs= negative forhemoptysis, wheezing, mucus production Gastrointestinal= negative for nausea, vomiting, melena, hematemesis Genitourinary= negative for Dysuria, Hematuria, Change in Urinary Frequency MSK = Negative for arthralgia, myalgias, Back Pain, Joint swelling  Neurology= Negative for headache, seizures, numbness, tingling  Psychiatry= Negative for anxiety, depression, suicidal and homocidal ideation Allergy/Immunology= Medication/Food allergy as listed  Skin= Negative for Rash, lesions, ulcers, itching   Vitals:   03/28/18 1730 03/28/18 1745  BP: (!) 174/78 (!) 162/88  Pulse: 68 68  Resp: 12 16   Temp:    SpO2: (!) 89% 94%   Constitutional: NAD, calm, comfortable Eyes: PERRL, lids and conjunctivae normal ENMT: Mucous membranes are moist. Posterior pharynx clear of any exudate or lesions.Normal dentition. Hoarse voice, no stridor.  Neck: normal, supple, no masses, no thyromegaly Respiratory: mild coarse BS Cardiovascular: Regular rate and rhythm, no murmurs / rubs / gallops. No extremity edema. 2+ pedal pulses. No carotid bruits.  Abdomen: + abd distention, no tenderness, no masses palpated. No hepatosplenomegaly. Bowel sounds positive.  Musculoskeletal: no clubbing / cyanosis. No joint deformity upper and lower extremities. Good ROM, no contractures. Normal muscle tone.  Skin: no rashes, lesions, ulcers. No induration Neurologic: CN 2-12 grossly intact. Sensation intact, DTR normal. Strength 5/5 in all 4.  Psychiatric: Normal judgment and insight. Alert and oriented x 3. Normal mood.    CTA: Large pulmonary embolism, shows mediastinal masses and adenopathy concerning for malignancy-primary versus metastases.  Also has hepatic cirrhosis with TIPS in place.  Assessment and plan:  Acute respiratory distress, hypoxia with exertion Pulmonary mass concerning for metastases versus primary -CT is negative for pulmonary embolism but shows concerning pulmonary masses.  He still at  risk of PE with elevated d-dimer therefore will get lower extremity Dopplers.  In the meantime will get CT of the head, abdomen and pelvis to look for any other lesions -Check AFP level, nebulizer treatments as needed, supplemental oxygen  Abdominal distention Decompensated hepatitis C liver cirrhosis -We will plan for paracentesis tomorrow, n.p.o. past midnight -Patient has not taking home medications including Lasix.  I advised him to be on Lasix to avoid frequent paracentesis at this puts him at high risk of SBP.  No previous history of SBP.  Denies any history of hepatocellular carcinoma.  History of  bilateral renal artery  stenosis status post stent -Currently not taking any medication.  Normotensive at this time  Tobacco use, former -Quit smoking over 20 years ago.  Bronchodilators as necessary  Thrombocytopenia -Platelets are 43.  Appears to be chronic with underlying Cirhosis.  We will continue to monitor.  SCDs Full Code  Wife a Bedside.

## 2018-03-28 NOTE — ED Triage Notes (Signed)
Pt reports shortness of breath and left side chest pain for several days. Pt states he is on the transplant list for a liver. Pt alert and oriented. No distress noted in triage.

## 2018-03-28 NOTE — ED Provider Notes (Signed)
Signed out pending CTA results.  No large PEs visualize the contrast bolus timing unable to rule out subsegmental PEs.  Patient does have 2 masses in the mediastinum concerning for possible malignancy.  Vital signs remained stable with the patient does drop oxygen saturation to low 90s when speaking.  Discussed with hospitalist patient and family member results of CT and need for further work-up.  Hospitalist to admit.   Julianne Rice, MD 03/28/18 [phone]

## 2018-03-28 NOTE — ED Provider Notes (Signed)
[facility] EMERGENCY DEPARTMENT Provider Note   CSN: [id number] Arrival date & time: 03/28/18  0941     History   Chief Complaint Chief Complaint  Patient presents with  . Shortness of Breath    HPI Adrian Compton is a 62 y.o. male.  Patient is a 62 year old male with a history of hepatitis C, cirrhosis of the liver, end-stage liver disease on the transplant list and chronic pain who is presenting today with chest pain.  He states the pain started 3-4 days ago and is a sharp stabbing sensation which is much worse when he goes to take a deep breath or when he coughs.  The pain is stayed in the same location in the left lateral portion of his chest.  It is not radiated anywhere.  It is not gotten any better.  He also notes more shortness of breath.  He states no position is comfortable and walking is definitely worse.  Patient does have a appointment for a paracentesis tomorrow as he is getting these every 2 weeks for ascites but he states the shortness of breath is different than when he needs a paracentesis.  He has not been around any sick contacts stays at home mostly and has not had fever or productive cough.  He denies any swelling of his legs and has no known heart disease.  The history is provided by the patient.  Chest Pain   This is a new problem. Episode onset: 3-4 days ago. The problem occurs constantly. The problem has not changed since onset.Associated with: started spontaneously. The pain is present in the lateral region. The pain is at a severity of 8/10. The pain is moderate. The quality of the pain is described as sharp and stabbing. The pain does not radiate. Duration of episode(s) is 4 days. The symptoms are aggravated by certain positions and deep breathing. Associated symptoms include cough and shortness of breath. Pertinent negatives include no abdominal pain, no diaphoresis, no fever, no irregular heartbeat, no leg pain, no lower extremity edema, no  nausea, no sputum production and no vomiting. He has tried rest for the symptoms. The treatment provided no relief.  Procedure history is positive for echocardiogram and stress echo. Past workup comments: both tests normal in 2013 when he was getting clearance for his liver transplant.    Past Medical History:  Diagnosis Date  . Cirrhosis of liver ([place])   . Cirrhosis of liver due to hepatitis C   . Hepatitis C   . Presence of right artificial knee joint     Patient Active Problem List   Diagnosis Date Noted  . Altered mental status   . Hepatic encephalopathy ([place]) 05/24/2015  . Chronic pain 05/24/2015  . Ascites 01/24/2013  . Hypotension, iatrogenic 01/24/2013  . Leukocytosis 01/24/2013  . Dilutional hyponatremia due to cirrhosis 01/24/2013  . Umbilical hernia 62/37/6283  . Acute hyperkalemia 01/24/2013  . Azotemia 01/24/2013  . Anemia in chronic illness 01/24/2013  . 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 Presumed SBP (spontaneous bacterial peritonitis) 01/24/2013  . Abdominal pain 01/23/2013  . Weakness 01/23/2013  . Hepatic cirrhosis due to chronic hepatitis C infection ([place]) 01/23/2013    Past Surgical History:  Procedure Laterality Date  . BACK SURGERY    . IR GENERIC HISTORICAL  03/02/2017   IR PARACENTESIS 03/02/2017 [name], PA [facility]  . IR PARACENTESIS  04/01/2017  . IR PARACENTESIS  05/10/2017  . IR PARACENTESIS  06/06/2017  .  IR PARACENTESIS  07/18/2017  . IR PARACENTESIS  08/12/2017  . IR PARACENTESIS  09/22/2017  . IR PARACENTESIS  11/04/2017  . IR PARACENTESIS  01/05/2018  . IR PARACENTESIS  02/10/2018  . IR PARACENTESIS  02/23/2018  . IR PARACENTESIS  03/16/2018  . KNEE ARTHROPLASTY    . LAMINECTOMY          Home Medications    Prior to Admission medications   Medication Sig Start Date End Date Taking? Authorizing Provider  albuterol (PROVENTIL HFA;VENTOLIN HFA) 108 (90 BASE) MCG/ACT inhaler Inhale 2 puffs into the  lungs every 6 (six) hours as needed for wheezing.   Yes [provider]  ondansetron (ZOFRAN) 4 MG tablet Take 1 tablet (4 mg total) by mouth every 6 (six) hours. 06/06/14  Yes [name], [name], PA-C  sertraline (ZOLOFT) 100 MG tablet Take 100 mg by mouth daily.   Yes [provider]  furosemide (LASIX) 40 MG tablet Take 1 tablet (40 mg total) by mouth daily. Patient not taking: Reported on 03/02/2017 01/30/13   Verlee Monte, MD  HYDROcodone-acetaminophen [facility]) 10-325 MG per tablet Take 1 tablet by mouth every 4 (four) hours as needed (pain).     [provider]  OxyCODONE (OXYCONTIN) 40 mg T12A 12 hr tablet Take 1 tablet (40 mg total) by mouth every 12 (twelve) hours. Patient not taking: Reported on 03/02/2017 05/26/15   Theodis Blaze, MD    Family History Family History  Problem Relation Age of Onset  . Breast cancer Mother   . Diabetes type II Mother     Social History Social History   Tobacco Use  . Smoking status: Former Smoker    Last attempt to quit: 04/12/2005    Years since quitting: 12.9  . Smokeless tobacco: Former Systems developer    Types: Snuff    Quit date: 04/12/2005  Substance Use Topics  . Alcohol use: No  . Drug use: No     Allergies   Nuvigil [armodafinil]; Codeine; and Lactose intolerance (gi)   Review of Systems Review of Systems  Constitutional: Negative for diaphoresis and fever.  Respiratory: Positive for cough and shortness of breath. Negative for sputum production.   Cardiovascular: Positive for chest pain.  Gastrointestinal: Negative for abdominal pain, nausea and vomiting.  All other systems reviewed and are negative.    Physical Exam Updated Vital Signs BP (!) 177/84   Pulse (!) 59   Temp 99.1 F (37.3 C) (Oral)   Resp (!) 9   SpO2 95%   Physical Exam  Constitutional: He is oriented to person, place, and time. He appears well-developed and well-nourished. No distress.  HENT:  Head: Normocephalic and atraumatic.    Mouth/Throat: Oropharynx is clear and moist.  Eyes: Pupils are equal, round, and reactive to light. Conjunctivae and EOM are normal.  Neck: Normal range of motion. Neck supple.  Cardiovascular: Normal rate, regular rhythm and intact distal pulses.  No murmur heard. Pulmonary/Chest: Effort normal and breath sounds normal. No respiratory distress. He has no wheezes. He has no rales.  Abdominal: Soft. He exhibits ascites. He exhibits no distension. There is no tenderness. There is no rebound and no guarding.  Musculoskeletal: Normal range of motion. He exhibits no edema or tenderness.  Neurological: He is alert and oriented to person, place, and time.  Skin: Skin is warm and dry. No rash noted. No erythema.  Psychiatric: He has a normal mood and affect. His behavior is normal.  Nursing note and vitals reviewed.  ED Treatments / Results  Labs (all labs ordered are listed, but only abnormal results are displayed) Labs Reviewed  BASIC METABOLIC PANEL - Abnormal; Notable for the following components:      Result Value   CO2 21 (*)    Glucose, Bld 134 (*)    All other components within normal limits  CBC - Abnormal; Notable for the following components:   Platelets 43 (*)    All other components within normal limits  D-DIMER, QUANTITATIVE (NOT AT [facility]) - Abnormal; Notable for the following components:   D-Dimer, Quant 2.37 (*)    All other components within normal limits  TROPONIN I    EKG EKG Interpretation  Date/Time:  Tuesday March 28 2018 10:02:46 EDT Ventricular Rate:  69 PR Interval:  166 QRS Duration: 102 QT Interval:  440 QTC Calculation: 471 R Axis:   -29 Text Interpretation:  Normal sinus rhythm Minimal voltage criteria for LVH, may be normal variant No significant change since last tracing Confirmed by Blanchie Dessert [phone]) on 03/28/2018 11:53:52 AM   Radiology Dg Chest 2 View  Result Date: 03/28/2018 CLINICAL DATA:  Left-sided chest pain and shortness of  breath for the past 5 days. EXAM: CHEST - 2 VIEW COMPARISON:  Chest x-ray dated May 24, 2015. FINDINGS: The heart size and mediastinal contours are within normal limits. Both lungs are clear. The visualized skeletal structures are unremarkable. IMPRESSION: No active cardiopulmonary disease. Electronically Signed   By: Titus Dubin M.D.   On: 03/28/2018 10:55    Procedures Procedures (including critical care time)  Medications Ordered in ED Medications - No data to display   Initial Impression / Assessment and Plan / ED Course  I have reviewed the triage vital signs and the nursing notes.  Pertinent labs & imaging results that were available during my care of the patient were reviewed by me and considered in my medical decision making (see chart for details).    Patient is a 62 year old male presenting today with a pleuritic type chest pain that is been persistent for the last 3-4 days.  It is in the same location in the left side of his chest but no radiation.  Low suspicion that patient's pain is related to dissection.  His EKG without acute findings and troponin is negative and lower suspicion for ACS.  Patient has had a nonproductive cough and could be that this is pleuritic in nature such as pleurisy or costochondritis, no signs on EKG concerning for pericarditis.  Also possibility for PE but patient denies unilateral leg pain or swelling.  He has not had any recent procedures and no prior history of PE.  Patient's chest x-ray today without evidence of pneumonia or pneumothorax.  He has no evidence of fluid overload.  CBC is unchanged and CMP without acute findings.  D-dimer is pending. patient's oxygen saturation are 95% on room air and he is not tachypneic.  He is scheduled for paracentesis tomorrow but denies any abdominal pain or fever concerning for an acute abdominal pathology.  3:25 PM D-dimer is elevated and CTA of chest to r/o PE ordered.   Final Clinical Impressions(s) / ED  Diagnoses   Final diagnoses:  None    ED Discharge Orders    None       Blanchie Dessert, MD 03/28/18 2038

## 2018-03-28 NOTE — H&P (Addendum)
History and Physical    DALANTE MINUS TIR:[id number] DOB: [birth year] DOA: 03/28/2018  **Will admit patient based on the expectation that the patient will need hospitalization/ hospital care that crosses at least 2 midnights  PCP: [place], [place]   Attending physician: Reesa Chew  Patient coming from/Resides with: Private residence/wife  Chief Complaint: Pleuritic chest discomfort and shortness of breath-hoarseness and unintended weight loss  HPI: Adrian Compton is a 62 y.o. male with medical history significant for former tobacco abuse and snuff use quit in 2006, history of hepatitis C which has been treated with associated cirrhosis/ascites/thrombocytopenia, history of chronic back pain and prior knee replacement surgery.  Patient reports had been in his usual state of health until about 10-12 days ago when he noticed he would become hoarse especially with speaking and with activity.  This was associated with shortness of breath.  For the past 5 days he has noticed pleuritic chest pain with breathing and worsening hoarseness.  Because of shortness of breath he has been unable to sleep at night.  He reports a weight loss of at least 10 pounds over the past 6 weeks and poor appetite.  He presented to the ER where he was actually somewhat hypertensive, had a low-grade temperature of 99.1.  Chest x-ray was unrevealing.  D-dimer was elevated so CTA of the chest was undertaken.  There was no definitive evidence of large central PE but due to limited opacification of the peripheral branches related to issues of timing of contrast bolus smaller peripheral PE could not be excluded.  Unfortunately more concerning were findings of 2 mediastinal masses versus cluster of adenopathy as well as 2 nodular densities in the left lung base of the largest measuring 15 mm.  The 2 mediastinal masses measure 4.1 x 2.1 cm at the aortopulmonary window and a 4.3 x 3.1 cm subcarinal mass with a 1.9 cm  right paratracheal lymph node.  Discussing with the patient again he has had the hoarseness as described in worsening [place] of breath and hoarseness with talking.  He has not had any odynophagia or any issues with difficulty swallowing.  Weight loss is reported above.  He denies hemoptysis, fevers or chills.  He reports his baseline weight (give or take regarding fluid overload and ascites) is between 235 and 240 pounds with current baseline weight down to about 220 pounds.  At rest he is not hypoxemic.  ED Course:  Vital Signs: BP (!) 162/88   Pulse 68   Temp 99.1 F (37.3 C) (Oral)   Resp 16   SpO2 94%  Chest x-ray/CTA of chest: As above Lab data: Sodium 136, potassium 4.0, chloride 105, CO2 21, glucose 134, BUN 8, creatinine 0.93, anion gap 10, troponin <0.03, white count 4300 differential not obtained, hemoglobin 13.7, platelets 43,000, d-dimer 2.37  Medications and treatments: Lidoderm patch x1, fentanyl 25 mcg IV x1, fentanyl 50 mcg IV x1  Review of Systems:  In addition to the HPI above,  No Fever-chills, myalgias or other constitutional symptoms No Headache, changes with Vision or hearing, new weakness, tingling, numbness in any extremity, dizziness, dysarthria or word finding difficulty, gait disturbance or imbalance, tremors or seizure activity No problems swallowing food or Liquids, indigestion/reflux, choking or coughing while eating, abdominal pain with or after eating No Cough, palpitations No Abdominal pain, N/V, melena,hematochezia, dark tarry stools, constipation No dysuria, malodorous urine, hematuria or flank pain No new skin rashes, lesions, masses or bruises, No new joint pains, aches,  swelling or redness No recent unintentional weight loss No polyuria, polydypsia or polyphagia   Past Medical History:  Diagnosis Date  . Cirrhosis of liver ([place])   . Cirrhosis of liver due to hepatitis C   . Hepatitis C   . Presence of right artificial knee joint     Past Surgical  History:  Procedure Laterality Date  . BACK SURGERY    . IR GENERIC HISTORICAL  03/02/2017   IR PARACENTESIS 03/02/2017 [name], PA [facility]  . IR PARACENTESIS  04/01/2017  . IR PARACENTESIS  05/10/2017  . IR PARACENTESIS  06/06/2017  . IR PARACENTESIS  07/18/2017  . IR PARACENTESIS  08/12/2017  . IR PARACENTESIS  09/22/2017  . IR PARACENTESIS  11/04/2017  . IR PARACENTESIS  01/05/2018  . IR PARACENTESIS  02/10/2018  . IR PARACENTESIS  02/23/2018  . IR PARACENTESIS  03/16/2018  . KNEE ARTHROPLASTY    . LAMINECTOMY      Social History   Socioeconomic History  . Marital status: Married    Spouse name: Not on file  . Number of children: Not on file  . Years of education: Not on file  . Highest education level: Not on file  Occupational History  . Not on file  Social Needs  . Financial resource strain: Not on file  . Food insecurity:    Worry: Not on file    Inability: Not on file  . Transportation needs:    Medical: Not on file    Non-medical: Not on file  Tobacco Use  . Smoking status: Former Smoker    Last attempt to quit: 04/12/2005    Years since quitting: 12.9  . Smokeless tobacco: Former Systems developer    Types: Snuff    Quit date: 04/12/2005  Substance and Sexual Activity  . Alcohol use: No  . Drug use: No  . Sexual activity: Never  Lifestyle  . Physical activity:    Days per week: Not on file    Minutes per session: Not on file  . Stress: Not on file  Relationships  . Social connections:    Talks on phone: Not on file    Gets together: Not on file    Attends religious service: Not on file    Active member of club or organization: Not on file    Attends meetings of clubs or organizations: Not on file    Relationship status: Not on file  . Intimate partner violence:    Fear of current or ex partner: Not on file    Emotionally abused: Not on file    Physically abused: Not on file    Forced sexual activity: Not on file  Other Topics Concern  . Not on file   Social History Narrative  . Not on file    Mobility: Typically mobilizes unassisted but currently feels weak enough that he likely could require a wheelchair Work history: Disabled   Allergies  Allergen Reactions  . Nuvigil [Armodafinil] Anaphylaxis and Hives  . Codeine Nausea And Vomiting  . Lactose Intolerance (Gi) Other (See Comments)    unspecified    Family History  Problem Relation Age of Onset  . Breast cancer Mother   . Diabetes type II Mother   . Cancer Father   . Multiple sclerosis Father   . Skin cancer Brother      Prior to Admission medications   Medication Sig Start Date End Date Taking? Authorizing Provider  albuterol (PROVENTIL HFA;VENTOLIN HFA) 108 (  90 BASE) MCG/ACT inhaler Inhale 2 puffs into the lungs every 6 (six) hours as needed for wheezing.   Yes [provider]  ondansetron (ZOFRAN) 4 MG tablet Take 1 tablet (4 mg total) by mouth every 6 (six) hours. 06/06/14  Yes [name], [name], PA-C  sertraline (ZOLOFT) 100 MG tablet Take 100 mg by mouth daily.   Yes [provider]  furosemide (LASIX) 40 MG tablet Take 1 tablet (40 mg total) by mouth daily. Patient not taking: Reported on 03/02/2017 01/30/13   Verlee Monte, MD  HYDROcodone-acetaminophen [facility]) 10-325 MG per tablet Take 1 tablet by mouth every 4 (four) hours as needed (pain).     [provider]  OxyCODONE (OXYCONTIN) 40 mg T12A 12 hr tablet Take 1 tablet (40 mg total) by mouth every 12 (twelve) hours. Patient not taking: Reported on 03/02/2017 05/26/15   Theodis Blaze, MD    Physical Exam: Vitals:   03/28/18 1700 03/28/18 1715 03/28/18 1730 03/28/18 1745  BP: (!) 180/87 (!) 191/82 (!) 174/78 (!) 162/88  Pulse: 64 71 68 68  Resp: (!) 8 12 12 16   Temp:      TempSrc:      SpO2: 92% (!) 87% (!) 89% 94%      Constitutional: NAD, calm, uncomfortable 2/2 ongoing pleuritic chest discomfort-appears chronically ill Eyes: PERRL, lids and conjunctivae normal ENMT: Mucous  membranes are moist. Posterior pharynx clear of any exudate or lesions.Normal dentition.  Neck: normal, supple, no masses, no thyromegaly-no stridor auscultated Respiratory: clear to auscultation bilaterally, no wheezing, no crackles. Normal respiratory effort. No accessory muscle use.  Cardiovascular: Regular rate and rhythm, no murmurs / rubs / gallops. No extremity edema. 2+ pedal pulses. No carotid bruits.  Abdomen: no tenderness, no masses palpated. No hepatosplenomegaly. Bowel sounds positive.  Abdomen distended and soft with a small amount of ascites. Musculoskeletal: no clubbing / cyanosis. No joint deformity upper and lower extremities. Good ROM, no contractures. Normal muscle tone.  Skin: no rashes, no concerning lesions be consistent with skin cancer, ulcers. No induration Neurologic: CN 2-12 grossly intact. Sensation intact, DTR normal. Strength 5/5 x all 4 extremities.  Psychiatric: Normal judgment and insight. Alert and oriented x 3. Normal mood.    Labs on Admission: I have personally reviewed following labs and imaging studies  CBC: Recent Labs  Lab 03/28/18 1058  WBC 4.3  HGB 13.7  HCT 39.3  MCV 87.5  PLT 43*   Basic Metabolic Panel: Recent Labs  Lab 03/28/18 1012  NA 136  K 4.0  CL 105  CO2 21*  GLUCOSE 134*  BUN 8  CREATININE 0.93  CALCIUM 9.3   GFR: CrCl cannot be calculated (Unknown ideal weight.). Liver Function Tests: No results for input(s): AST, ALT, ALKPHOS, BILITOT, PROT, ALBUMIN in the last 168 hours. No results for input(s): LIPASE, AMYLASE in the last 168 hours. No results for input(s): AMMONIA in the last 168 hours. Coagulation Profile: No results for input(s): INR, PROTIME in the last 168 hours. Cardiac Enzymes: Recent Labs  Lab 03/28/18 1012  TROPONINI <0.03   BNP (last 3 results) No results for input(s): PROBNP in the last 8760 hours. HbA1C: No results for input(s): HGBA1C in the last 72 hours. CBG: No results for input(s):  GLUCAP in the last 168 hours. Lipid Profile: No results for input(s): CHOL, HDL, LDLCALC, TRIG, CHOLHDL, LDLDIRECT in the last 72 hours. Thyroid Function Tests: No results for input(s): TSH, T4TOTAL, FREET4, T3FREE, THYROIDAB in the last 72 hours.  Anemia Panel: No results for input(s): VITAMINB12, FOLATE, FERRITIN, TIBC, IRON, RETICCTPCT in the last 72 hours. Urine analysis:    Component Value Date/Time   COLORURINE AMBER (A) 05/24/2015 1239   APPEARANCEUR CLEAR 05/24/2015 1239   LABSPEC 1.015 05/24/2015 1239   PHURINE 5.5 05/24/2015 1239   GLUCOSEU NEGATIVE 05/24/2015 1239   HGBUR NEGATIVE 05/24/2015 1239   BILIRUBINUR NEGATIVE 05/24/2015 1239   KETONESUR NEGATIVE 05/24/2015 1239   PROTEINUR NEGATIVE 05/24/2015 1239   UROBILINOGEN 1.0 05/24/2015 1239   NITRITE NEGATIVE 05/24/2015 1239   LEUKOCYTESUR NEGATIVE 05/24/2015 1239   Sepsis Labs: @LABRCNTIP (procalcitonin:4,lacticidven:4) )No results found for this or any previous visit (from the past 240 hour(s)).   Radiological Exams on Admission: Dg Chest 2 View  Result Date: 03/28/2018 CLINICAL DATA:  Left-sided chest pain and shortness of breath for the past 5 days. EXAM: CHEST - 2 VIEW COMPARISON:  Chest x-ray dated May 24, 2015. FINDINGS: The heart size and mediastinal contours are within normal limits. Both lungs are clear. The visualized skeletal structures are unremarkable. IMPRESSION: No active cardiopulmonary disease. Electronically Signed   By: Titus Dubin M.D.   On: 03/28/2018 10:55   Ct Angio Chest Pe W And/or Wo Contrast  Result Date: 03/28/2018 CLINICAL DATA:  Chest pain, shortness of breath. EXAM: CT ANGIOGRAPHY CHEST WITH CONTRAST TECHNIQUE: Multidetector CT imaging of the chest was performed using the standard protocol during bolus administration of intravenous contrast. Multiplanar CT image reconstructions and MIPs were obtained to evaluate the vascular anatomy. CONTRAST:  14mL ISOVUE-370 IOPAMIDOL (ISOVUE-370)  INJECTION 76% COMPARISON:  Radiographs of same day. FINDINGS: Cardiovascular: There is no evidence of large central pulmonary embolus. However, there is limited opacification of the peripheral branches, and therefore smaller peripheral pulmonary emboli cannot be excluded on the basis of this exam. There is no evidence of thoracic aortic dissection or aneurysm. No pericardial effusion is noted. Mediastinum/Nodes: Thyroid gland is unremarkable. 4.1 x 2.1 cm mass is noted in aortopulmonary window of mediastinum. 4.3 x 3.1 cm subcarinal mass is noted 1.9 cm right paratracheal lymph node is noted. Lungs/Pleura: No pneumothorax or pleural effusion is noted. Minimal bibasilar subsegmental atelectasis is noted. 9 mm irregular nodule is noted in left lung base. 15 mm irregular density is noted medially in left lung base adjacent to distal thoracic aorta. Upper Abdomen: Hepatic cirrhosis is noted with TIPS stent. Severe splenomegaly is noted. Mild ascites is noted. Musculoskeletal: No chest wall abnormality. No acute or significant osseous findings. Review of the MIP images confirms the above findings. IMPRESSION: No definite evidence of large central pulmonary embolus is noted. However, due to limited opacification of the peripheral branches most likely due to issues related to timing of contrast bolus, smaller peripheral pulmonary emboli cannot be excluded on the basis of this exam. Mediastinal masses or adenopathy is noted concerning for malignancy. Also noted are 2 nodular densities in the left lung base, the largest measuring 15 mm. Malignancy and metastatic disease cannot be excluded, and PET scan is recommended for further evaluation. Hepatic cirrhosis with associated splenomegaly and TIPS stent is noted. Mild ascites is noted. Electronically Signed   By: Marijo Conception, M.D.   On: 03/28/2018 16:12    EKG: (Independently reviewed) sinus rhythm with ventricular rate 69 bpm, QTC 471 ms, nonspecific ST changes in lead  III, normal R wave rotation, no acute ischemic changes  Assessment/Plan Principal Problem:   Mediastinal mass/ Pleuritic chest pain/ Hoarseness -Presents with 10 days of hoarseness followed by significant pleuritic  chest pain associated with shortness of breath with activity as well as talking as well as unintended weight loss-CTA did reveal 2 mediastinal masses as well as a subcarinal lymph node and 2 peripheral lesions in the left lung -History of former tobacco abuse with smoking and snuff use -Elevated d-dimer although CTA unable to fully exclude peripheral PE so will obtain venous duplex -No hypoxemia -Given hoarseness may eventually need bronchoscopy; either way need to consult pulmonary medicine to determine if mediastinal masses able to be accessed via bronchoscopy or will need CT-guided biopsy -Continue narcotics for pain relief -Obtain LDH -Likely needs PET scan at some point -CT head -Lidoderm patch initiated in ER  Active Problems:   Hepatitis C (treated) w/ Cirrhosis of liver  -Patient at risk for possible hepatocellular carcinoma so check AFP -CT abdomen and pelvis -Undergo serial paracentesis in IR and was scheduled for 10 AM on 4/24-IR notified of need to evaluate patient and change to inpatient procedure -Has chronic thrombocytopenia current platelets at baseline of between 36,000 and 50,000 -On transplant list    Unintended weight loss -Patient reports pound weight loss over 6 weeks -Nutrition consultation -Concerned this may be related to malignancy as described above -Obtain pre-albumin, medium and phosphorus    Chronic low back pain -Continue preadmission oral narcotics    Presence of right artificial knee joint    **Additional lab, imaging and/or diagnostic evaluation at discretion of supervising physician  DVT prophylaxis: SCDs Code Status: DO NOT RESUSCITATE Family Communication: Wife Disposition Plan: Home Consults called: Please notify pulmonology  of need for consultation in a.m. on 4/24    ELLIS,ALLISON L. ANP-BC [facility] Pager [phone]   If 7PM-7AM, please contact night-coverage [email or web address] Password [facility]  03/28/2018, 6:11 PM    Addendum 640pm:  Please refer to my note labeled H&P from 4/23 628pm.    Agree with plan above.   Ankit Arsenio Loader, MD [facility] Pager [phone]   If 7PM-7AM, please contact night-coverage [email or web address] Password [facility] 03/28/2018, 6:41 PM

## 2018-03-29 ENCOUNTER — Inpatient Hospital Stay (HOSPITAL_COMMUNITY): Payer: Medicare Other

## 2018-03-29 ENCOUNTER — Ambulatory Visit (HOSPITAL_COMMUNITY): Admission: RE | Admit: 2018-03-29 | Payer: Medicare Other | Source: Ambulatory Visit

## 2018-03-29 ENCOUNTER — Encounter (HOSPITAL_COMMUNITY): Payer: Self-pay | Admitting: Interventional Radiology

## 2018-03-29 ENCOUNTER — Other Ambulatory Visit: Payer: Self-pay

## 2018-03-29 DIAGNOSIS — J9859 Other diseases of mediastinum, not elsewhere classified: Secondary | ICD-10-CM

## 2018-03-29 DIAGNOSIS — R609 Edema, unspecified: Secondary | ICD-10-CM

## 2018-03-29 DIAGNOSIS — R0602 Shortness of breath: Secondary | ICD-10-CM

## 2018-03-29 HISTORY — PX: IR PARACENTESIS: IMG2679

## 2018-03-29 LAB — COMPREHENSIVE METABOLIC PANEL
ALBUMIN: 3.5 g/dL (ref 3.5–5.0)
ALK PHOS: 79 U/L (ref 38–126)
ALT: 13 U/L — ABNORMAL LOW (ref 17–63)
ANION GAP: 8 (ref 5–15)
AST: 22 U/L (ref 15–41)
BILIRUBIN TOTAL: 2.6 mg/dL — AB (ref 0.3–1.2)
BUN: 10 mg/dL (ref 6–20)
CALCIUM: 8.9 mg/dL (ref 8.9–10.3)
CO2: 24 mmol/L (ref 22–32)
Chloride: 105 mmol/L (ref 101–111)
Creatinine, Ser: 0.98 mg/dL (ref 0.61–1.24)
GFR calc Af Amer: >60 mL/min (ref >60)
GFR calc non Af Amer: >60 mL/min (ref >60)
GLUCOSE: 107 mg/dL — AB (ref 65–99)
Potassium: 3.9 mmol/L (ref 3.5–5.1)
Sodium: 137 mmol/L (ref 135–145)
TOTAL PROTEIN: 7 g/dL (ref 6.5–8.1)

## 2018-03-29 LAB — CBC
HCT: 37.3 % — ABNORMAL LOW (ref 39.0–52.0)
Hemoglobin: 13 g/dL (ref 13.0–17.0)
MCH: 30.4 pg (ref 26.0–34.0)
MCHC: 34.9 g/dL (ref 30.0–36.0)
MCV: 87.1 fL (ref 78.0–100.0)
Platelets: 35 10*3/uL — ABNORMAL LOW (ref 150–400)
RBC: 4.28 MIL/uL (ref 4.22–5.81)
RDW: 15.1 % (ref 11.5–15.5)
WBC: 3.6 10*3/uL — ABNORMAL LOW (ref 4.0–10.5)

## 2018-03-29 LAB — PROTIME-INR
INR: 1.52
PROTHROMBIN TIME: 18.2 s — AB (ref 11.4–15.2)

## 2018-03-29 LAB — HIV ANTIBODY (ROUTINE TESTING W REFLEX): HIV Screen 4th Generation wRfx: NONREACTIVE

## 2018-03-29 LAB — APTT: APTT: 35 s (ref 24–36)

## 2018-03-29 LAB — AFP TUMOR MARKER: AFP, SERUM, TUMOR MARKER: 26.6 ng/mL — AB (ref 0.0–8.3)

## 2018-03-29 LAB — MAGNESIUM: Magnesium: 1.8 mg/dL (ref 1.7–2.4)

## 2018-03-29 MED ORDER — FUROSEMIDE 10 MG/ML IJ SOLN
40.0000 mg | Freq: Two times a day (BID) | INTRAMUSCULAR | Status: DC
  Filled 2018-03-29: qty 4

## 2018-03-29 MED ORDER — LIDOCAINE HCL (PF) 2 % IJ SOLN
INTRAMUSCULAR | Status: AC
  Filled 2018-03-29: qty 20

## 2018-03-29 MED ORDER — LIDOCAINE HCL (PF) 1 % IJ SOLN
INTRAMUSCULAR | Status: DC | PRN
  Administered 2018-03-29: 10 mL

## 2018-03-29 NOTE — Procedures (Signed)
PROCEDURE SUMMARY:  Successful image-guided paracentesis from the left lower abdomen.  Yielded 2.4 liters of clear gold fluid.  No immediate complications.  Patient tolerated well.   Specimen was not sent for labs.  Alexandra Louk PA-C 03/29/2018 11:13 AM

## 2018-03-29 NOTE — Progress Notes (Signed)
Bilateral lower extremity venous duplex completed. Preliminary results. There is no evidence of DVT, superficial thrombosis, or [place], [place] 03/29/2018 5:07 PM

## 2018-03-29 NOTE — Progress Notes (Addendum)
PROGRESS NOTE    Adrian Compton  ZOX:[id number] DOB: 1956-05-27 DOA: 03/28/2018 PCP: [facility]  Brief Narrative: 62 y.o. male with medical history significant for former tobacco abuse and snuff use quit in 2006, history of hepatitis C which has been treated with associated cirrhosis/ascites/thrombocytopenia, history of chronic back pain and prior knee replacement surgery.  Patient reports had been in his usual state of health until about 10-12 days ago when he noticed he would become hoarse especially with speaking and with activity.  This was associated with shortness of breath.  For the past 5 days he has noticed pleuritic chest pain with breathing and worsening hoarseness.  Because of shortness of breath he has been unable to sleep at night.  He reports a weight loss of at least 10 pounds over the past 6 weeks and poor appetite.  He presented to the ER where he was actually somewhat hypertensive, had a low-grade temperature of 99.1.  Chest x-ray was unrevealing.  D-dimer was elevated so CTA of the chest was undertaken.  There was no definitive evidence of large central PE but due to limited opacification of the peripheral branches related to issues of timing of contrast bolus smaller peripheral PE could not be excluded.  Unfortunately more concerning were findings of 2 mediastinal masses versus cluster of adenopathy as well as 2 nodular densities in the left lung base of the largest measuring 15 mm.  The 2 mediastinal masses measure 4.1 x 2.1 cm at the aortopulmonary window and a 4.3 x 3.1 cm subcarinal mass with a 1.9 cm right paratracheal lymph node.  Discussing with the patient again he has had the hoarseness as described in worsening [place] of breath and hoarseness with talking.  He has not had any odynophagia or any issues with difficulty swallowing.  Weight loss is reported above.  He denies hemoptysis, fevers or chills.  He reports his baseline weight (give or take regarding  fluid overload and ascites) is between 235 and 240 pounds with current baseline weight down to about 220 pounds.  At rest he is not hypoxemic.    Assessment & Plan:   Principal Problem:   Mediastinal mass Active Problems:   Chronic low back pain   Hepatitis C (treated)   Cirrhosis of liver due to hepatitis C   Presence of right artificial knee joint   Unintended weight loss   Pleuritic chest pain   Hoarseness  1] hepatitis C with cirrhosis of the liver with ascites status post paracentesis taken over 2 L of fluid.  She did undergo his serial paracentesis in IR and was scheduled to have a paracentesis done on 4 24 2019 as an outpatient.  He is on transplant list.  AFPpending.-he has had TIPS  in the past.  2] mediastinal mass chest CT done showsNo definite evidence of large central pulmonary embolus is noted. However, due to limited opacification of the peripheral branches most likely due to issues related to timing of contrast bolus, smaller peripheral pulmonary emboli cannot be excluded on the basis of this exam.  Mediastinal masses or adenopathy is noted concerning for malignancy. Also noted are 2 nodular densities in the left lung base, the largest measuring 15 mm. Malignancy and metastatic disease cannot be excluded, and PET scan is recommended for further evaluation.  Hepatic cirrhosis with associated splenomegaly and TIPS stent is noted. Mild ascites is noted.  mediastinal mass as well as 2 densities in the left lower lobe.  Patient with  history of tobacco use with smoking and significant use.  He has had loss of appetite and over 10 to 12 pound weight loss in the last 6 months.  CT angiogram did not reveal any central pulmonary embolism.  Venous Doppler--- CT scan of the chest and head was done without contrast which did not show any metastatic lesions.  Discussed with pulmonary Dr. Elsworth Soho who will arrange to follow-up to see the patient as an outpatient.Patient will need a  PET scan as an outpatient.  3] history of bilateral renal artery stenosis test status post stenting-blood pressure elevated restart Lasix.  4] chronic thrombocytopenia stable  5] former smoker quit smoking over 20 years ago  6] hypertension restart Lasix.  Patient takes Lasix 40 mg once a day at home I will start him on Lasix 40 mg twice a day  For now and taper the dose accordingly.  7] hypomagnesemia replete recheck      DVT prophylaxis: SCD Code Status: DO NOT RESUSCITATE Family Communication: No family available Disposition Plan: TBD Consultants:  None Procedures: Abdominal paracentesis 03/29/2018 Antimicrobials: None  Subjective: Complains of shortness of breath  Objective: Vitals:   03/28/18 2347 03/29/18 0457 03/29/18 0815 03/29/18 0900  BP: (!) 173/95  (!) 187/97 (!) 176/92  Pulse: 72  76   Resp: 16  17   Temp: 97.8 F (36.6 C)  (!) 97.4 F (36.3 C)   TempSrc: Oral  Oral   SpO2: 98%  96%   Weight:  99.7 kg (219 lb 12.8 oz)    Height:        Intake/Output Summary (Last 24 hours) at 03/29/2018 1204 Last data filed at 03/29/2018 0600 Gross per 24 hour  Intake 747.5 ml  Output -  Net 747.5 ml   Filed Weights   03/28/18 2200 03/29/18 0457  Weight: 99.7 kg (219 lb 12.8 oz) 99.7 kg (219 lb 12.8 oz)    Examination:  General exam: Appears calm and comfortable  Respiratory system: FEW SCATTERED rhonchi  to auscultation. Respiratory effort normal. Cardiovascular system: S1 & S2 heard, RRR. No JVD, murmurs, rubs, gallops or clicks. No pedal edema. Gastrointestinal system: Abdomen is nondistended, soft and nontender. No organomegaly or masses felt. Normal bowel sounds heard. Central nervous system: Alert and oriented. No focal neurological deficits. Extremities: Symmetric 5 x 5 power. Skin: No rashes, lesions or ulcers Psychiatry: Judgement and insight appear normal. Mood & affect appropriate.     Data Reviewed: I have personally reviewed following labs and  imaging studies  CBC: Recent Labs  Lab 03/28/18 1058 03/29/18 0229  WBC 4.3 3.6*  HGB 13.7 13.0  HCT 39.3 37.3*  MCV 87.5 87.1  PLT 43* 35*   Basic Metabolic Panel: Recent Labs  Lab 03/28/18 1012 03/28/18 1823 03/29/18 0229  NA 136  --  137  K 4.0  --  3.9  CL 105  --  105  CO2 21*  --  24  GLUCOSE 134*  --  107*  BUN 8  --  10  CREATININE 0.93  --  0.98  CALCIUM 9.3  --  8.9  MG  --  1.7 1.8  PHOS  --  2.8  --    GFR: Estimated Creatinine Clearance: 94.6 mL/min (by C-G formula based on SCr of 0.98 mg/dL). Liver Function Tests: Recent Labs  Lab 03/29/18 0229  AST 22  ALT 13*  ALKPHOS 79  BILITOT 2.6*  PROT 7.0  ALBUMIN 3.5   No results for input(s): LIPASE,  AMYLASE in the last 168 hours. No results for input(s): AMMONIA in the last 168 hours. Coagulation Profile: Recent Labs  Lab 03/29/18 0229  INR 1.52   Cardiac Enzymes: Recent Labs  Lab 03/28/18 1012  TROPONINI <0.03   BNP (last 3 results) No results for input(s): PROBNP in the last 8760 hours. HbA1C: No results for input(s): HGBA1C in the last 72 hours. CBG: No results for input(s): GLUCAP in the last 168 hours. Lipid Profile: No results for input(s): CHOL, HDL, LDLCALC, TRIG, CHOLHDL, LDLDIRECT in the last 72 hours. Thyroid Function Tests: No results for input(s): TSH, T4TOTAL, FREET4, T3FREE, THYROIDAB in the last 72 hours. Anemia Panel: No results for input(s): VITAMINB12, FOLATE, FERRITIN, TIBC, IRON, RETICCTPCT in the last 72 hours. Sepsis Labs: No results for input(s): PROCALCITON, LATICACIDVEN in the last 168 hours.  No results found for this or any previous visit (from the past 240 hour(s)).       Radiology Studies: Ct Abdomen Pelvis Wo Contrast  Result Date: 03/29/2018 CLINICAL DATA:  Mediastinal mass. Initial workup. History of hepatitis-C, cirrhosis, recurrent paracentesis. EXAM: CT ABDOMEN AND PELVIS WITHOUT CONTRAST TECHNIQUE: Multidetector CT imaging of the abdomen and  pelvis was performed following the standard protocol without IV contrast. COMPARISON:  CT abdomen and pelvis January 24, 2013 FINDINGS: LOWER CHEST: 4 mm subsolid RIGHT lower lobe pulmonary nodules along the major fissure. LEFT lower lobe pulmonary nodules measuring to 9 mm. HEPATOBILIARY: Cirrhotic liver with TIPS. Layering density in the gallbladder most compatible with vicarious excretion of contrast. Enlarged main portal vein. PANCREAS: Atrophic. SPLEEN: Splenomegaly, 18.3 cm in cranial caudad dimension increased from prior CT. 12 mm nodule adjacent to the spleen seen with splenial or lymph node. ADRENALS/URINARY TRACT: LEFT kidney is inferiorly displaced by splenomegaly. No nephrolithiasis, hydronephrosis; limited assessment for renal masses on this nonenhanced examination. 17 mm cyst RIGHT interpolar kidney, new from prior CT. Contrast excretion urinary collecting system. Urinary bladder is partially distended with contrast. Normal adrenal glands. STOMACH/BOWEL: Small hiatal hernia with surrounding varices. Moderate sigmoid colonic diverticulosis. Small and large bowel are normal in course and caliber. Normal appendix. VASCULAR/LYMPHATIC: Aortoiliac vessels are normal in course and caliber. Moderate calcific atherosclerosis. No lymphadenopathy by CT size criteria. REPRODUCTIVE: Normal. OTHER: Moderate ascites. MUSCULOSKELETAL: Non-acute. Mild rectus abdominis diastasis. Moderate to severe L5-S1 degenerative disc. IMPRESSION: 1. No abdominopelvic tumor, limited assessment by noncontrast CT. 2. Cirrhosis and sequelae of portal hypertension, status post TIPS. Moderate ascites. Aortic Atherosclerosis (ICD10-I70.0). Electronically Signed   By: Elon Alas M.D.   On: 03/29/2018 01:43   Dg Chest 2 View  Result Date: 03/28/2018 CLINICAL DATA:  Left-sided chest pain and shortness of breath for the past 5 days. EXAM: CHEST - 2 VIEW COMPARISON:  Chest x-ray dated May 24, 2015. FINDINGS: The heart size and  mediastinal contours are within normal limits. Both lungs are clear. The visualized skeletal structures are unremarkable. IMPRESSION: No active cardiopulmonary disease. Electronically Signed   By: Titus Dubin M.D.   On: 03/28/2018 10:55   Ct Head Wo Contrast  Result Date: 03/29/2018 CLINICAL DATA:  Mediastinal mass. Assess for intracranial pathology. History of cirrhosis and hepatitis C. EXAM: CT HEAD WITHOUT CONTRAST TECHNIQUE: Contiguous axial images were obtained from the base of the skull through the vertex without intravenous contrast. COMPARISON:  None. FINDINGS: BRAIN: No intraparenchymal hemorrhage, mass effect nor midline shift. The ventricles and sulci are normal for age. No acute large vascular territory infarcts. No abnormal extra-axial fluid collections. Basal cisterns are patent.  VASCULAR: Mild calcific atherosclerosis of the carotid siphons. SKULL: No skull fracture. No significant scalp soft tissue swelling. SINUSES/ORBITS: The mastoid air-cells and included paranasal sinuses are well-aerated.The included ocular globes and orbital contents are non-suspicious. OTHER: None. IMPRESSION: Negative noncontrast CT HEAD for age. Electronically Signed   By: Elon Alas M.D.   On: 03/29/2018 01:24   Ct Angio Chest Pe W And/or Wo Contrast  Result Date: 03/28/2018 CLINICAL DATA:  Chest pain, shortness of breath. EXAM: CT ANGIOGRAPHY CHEST WITH CONTRAST TECHNIQUE: Multidetector CT imaging of the chest was performed using the standard protocol during bolus administration of intravenous contrast. Multiplanar CT image reconstructions and MIPs were obtained to evaluate the vascular anatomy. CONTRAST:  112mL ISOVUE-370 IOPAMIDOL (ISOVUE-370) INJECTION 76% COMPARISON:  Radiographs of same day. FINDINGS: Cardiovascular: There is no evidence of large central pulmonary embolus. However, there is limited opacification of the peripheral branches, and therefore smaller peripheral pulmonary emboli cannot be  excluded on the basis of this exam. There is no evidence of thoracic aortic dissection or aneurysm. No pericardial effusion is noted. Mediastinum/Nodes: Thyroid gland is unremarkable. 4.1 x 2.1 cm mass is noted in aortopulmonary window of mediastinum. 4.3 x 3.1 cm subcarinal mass is noted 1.9 cm right paratracheal lymph node is noted. Lungs/Pleura: No pneumothorax or pleural effusion is noted. Minimal bibasilar subsegmental atelectasis is noted. 9 mm irregular nodule is noted in left lung base. 15 mm irregular density is noted medially in left lung base adjacent to distal thoracic aorta. Upper Abdomen: Hepatic cirrhosis is noted with TIPS stent. Severe splenomegaly is noted. Mild ascites is noted. Musculoskeletal: No chest wall abnormality. No acute or significant osseous findings. Review of the MIP images confirms the above findings. IMPRESSION: No definite evidence of large central pulmonary embolus is noted. However, due to limited opacification of the peripheral branches most likely due to issues related to timing of contrast bolus, smaller peripheral pulmonary emboli cannot be excluded on the basis of this exam. Mediastinal masses or adenopathy is noted concerning for malignancy. Also noted are 2 nodular densities in the left lung base, the largest measuring 15 mm. Malignancy and metastatic disease cannot be excluded, and PET scan is recommended for further evaluation. Hepatic cirrhosis with associated splenomegaly and TIPS stent is noted. Mild ascites is noted. Electronically Signed   By: Marijo Conception, M.D.   On: 03/28/2018 16:12        Scheduled Meds: . lidocaine  1 patch Transdermal Q24H  . lidocaine      . sertraline  100 mg Oral Daily  . sodium chloride flush  3 mL Intravenous Q12H   Continuous Infusions: . sodium chloride 75 mL/hr at 03/29/18 0123     LOS: 1 day    Georgette Shell, MD [facility] If 7PM-7AM, please contact night-coverage [email or web address] Password  [facility] 03/29/2018, 12:04 PM

## 2018-03-29 NOTE — Plan of Care (Signed)
  Problem: Education: Goal: Knowledge of General Education information will improve Outcome: Progressing   Problem: Activity: Goal: Risk for activity intolerance will decrease Outcome: Progressing   Problem: Elimination: Goal: Will not experience complications related to bowel motility Outcome: Progressing   Problem: Safety: Goal: Ability to remain free from injury will improve Outcome: Progressing

## 2018-03-30 LAB — COMPREHENSIVE METABOLIC PANEL
ALT: 14 U/L — ABNORMAL LOW (ref 17–63)
ANION GAP: 9 (ref 5–15)
AST: 23 U/L (ref 15–41)
Albumin: 3.7 g/dL (ref 3.5–5.0)
Alkaline Phosphatase: 78 U/L (ref 38–126)
BUN: 13 mg/dL (ref 6–20)
CO2: 25 mmol/L (ref 22–32)
CREATININE: 0.85 mg/dL (ref 0.61–1.24)
Calcium: 9.1 mg/dL (ref 8.9–10.3)
Chloride: 104 mmol/L (ref 101–111)
GFR calc non Af Amer: >60 mL/min (ref >60)
Glucose, Bld: 119 mg/dL — ABNORMAL HIGH (ref 65–99)
POTASSIUM: 4.6 mmol/L (ref 3.5–5.1)
SODIUM: 138 mmol/L (ref 135–145)
Total Bilirubin: 2.3 mg/dL — ABNORMAL HIGH (ref 0.3–1.2)
Total Protein: 6.9 g/dL (ref 6.5–8.1)

## 2018-03-30 LAB — CBC WITH DIFFERENTIAL/PLATELET
BASOS ABS: 0 10*3/uL (ref 0.0–0.1)
Basophils Relative: 0 %
EOS PCT: 3 %
Eosinophils Absolute: 0.1 10*3/uL (ref 0.0–0.7)
HCT: 38 % — ABNORMAL LOW (ref 39.0–52.0)
HEMOGLOBIN: 13.2 g/dL (ref 13.0–17.0)
LYMPHS PCT: 13 %
Lymphs Abs: 0.5 10*3/uL — ABNORMAL LOW (ref 0.7–4.0)
MCH: 30.4 pg (ref 26.0–34.0)
MCHC: 34.7 g/dL (ref 30.0–36.0)
MCV: 87.6 fL (ref 78.0–100.0)
Monocytes Absolute: 0.2 10*3/uL (ref 0.1–1.0)
Monocytes Relative: 6 %
NEUTROS PCT: 78 %
Neutro Abs: 2.8 10*3/uL (ref 1.7–7.7)
PLATELETS: 33 10*3/uL — AB (ref 150–400)
RBC: 4.34 MIL/uL (ref 4.22–5.81)
RDW: 15.2 % (ref 11.5–15.5)
WBC: 3.6 10*3/uL — AB (ref 4.0–10.5)

## 2018-03-30 LAB — MAGNESIUM: MAGNESIUM: 1.8 mg/dL (ref 1.7–2.4)

## 2018-03-30 MED ORDER — OXYCODONE HCL 5 MG PO TABS: 5.0000 mg | ORAL_TABLET | Freq: Three times a day (TID) | ORAL | 0 refills | Status: AC | PRN

## 2018-03-30 MED ORDER — LIDOCAINE 5 % EX PTCH: 1.0000 | MEDICATED_PATCH | CUTANEOUS | 0 refills | Status: DC

## 2018-03-30 MED ORDER — OXYCODONE HCL 5 MG PO TABS: 5.0000 mg | ORAL_TABLET | Freq: Three times a day (TID) | ORAL | 0 refills | Status: DC | PRN

## 2018-03-30 NOTE — Discharge Summary (Signed)
Physician Discharge Summary  Adrian Compton GEX:[id number] DOB: 09-06-1956 DOA: 03/28/2018  PCP: [facility]  Admit date: 03/28/2018 Discharge date: 03/30/2018  Admitted From: Home Disposition: Home  Recommendations for Outpatient Follow-up:  1. Follow up with PCP in 1-2 weeks 2. Please obtain BMP/CBC in one week 3. Follow-up with pulmonologist Dr. Elsworth Soho  Home Health none Equipment/Devices none Discharge Condition: Stable CODE STATUS: DO NOT RESUSCITATE Diet recommendation cardiac  Brief/Interim Summary:62 y.o.malewith medical history significant forformer tobacco abuse and snuff use quit in 2006, history of hepatitis C which has been treated with associated cirrhosis/ascites/thrombocytopenia, history of chronic back pain and prior knee replacement surgery. Patient reports had been in his usual state of health until about 10-12 days ago when he noticed he would become hoarse especially with speaking and with activity. This was associated with shortness of breath. For the past 5 days he has noticed pleuritic chest pain with breathing and worsening hoarseness. Because of shortness of breath he has been unable to sleep at night. He reports a weight loss of at least 10 pounds over the past 6 weeks and poor appetite. He presented to the ER where he was actually somewhat hypertensive, had a low-grade temperature of 99.1. Chest x-ray was unrevealing. D-dimer was elevated so CTA of the chest was undertaken. There was no definitive evidence of large central PE but due to limited opacification of the peripheral branches related to issues of timing of contrast bolus smaller peripheral PE could not be excluded. Unfortunately more concerning were findings of 2 mediastinal masses versus cluster of adenopathy as well as 2 nodular densities in the left lung base of the largest measuring 15 mm. The 2 mediastinal masses measure 4.1 x 2.1 cm at the aortopulmonary window and a  4.3 x 3.1 cm subcarinal mass with a 1.9 cm right paratracheal lymph node. Discussing with the patient again he has had the hoarseness as described in worsening [place] of breath and hoarseness with talking. He has not had any odynophagia or any issues with difficulty swallowing. Weight loss is reported above. He denies hemoptysis, fevers or chills. He reports his baseline weight (give or take regarding fluid overload and ascites)is between 235 and 240 pounds with current baseline weight down to about 220 pounds. At rest he is not hypoxemic.     Discharge Diagnoses:  Principal Problem:   Mediastinal mass Active Problems:   Chronic low back pain   Hepatitis C (treated)   Cirrhosis of liver due to hepatitis C   Presence of right artificial knee joint   Unintended weight loss   Pleuritic chest pain   Hoarseness  1] hepatitis C with cirrhosis of the liver with ascites status post paracentesis taken over 2 L of fluid.  I have discussed with him the importance of taking Lasix at home.  However he is very adamant that he will not be taking any diuretics.  He is on the transplant list.  And he has had a TIPS in the past.   2] mediastinal mass-discussed with Dr. Elsworth Soho pulmonologist who has agreed to see the patient as an outpatient for further work-up.  Patient to call his office.  Chest CT done showsNo definite evidence of large central pulmonary embolus is noted. However, due to limited opacification of the peripheral branches most likely due to issues related to timing of contrast bolus, smaller peripheral pulmonary emboli cannot be excluded on the basis of this exam.  Mediastinal masses or adenopathy is noted concerning for  malignancy. Also noted are 2 nodular densities in the left lung base, the largest measuring 15 mm. Malignancy and metastatic disease cannot be excluded, and PET scan is recommended for further evaluation.  Hepatic cirrhosis with associated splenomegaly and TIPS stent  is noted. Mild ascites is noted.  mediastinal mass as well as 2 densities in the left lower lobe.  Patient with history of tobacco use with smoking and significant use.  He has had loss of appetite and over 10 to 12 pound weight loss in the last 6 months.  CT angiogram did not reveal any central pulmonary embolism.  Venous Doppler--- CT scan of the chest and head was done without contrast which did not show any metastatic lesions.  Discussed with pulmonary Dr. Elsworth Soho who will arrange to follow-up to see the patient as an outpatient.Patient will need a PET scan as an outpatient.  3] history of bilateral renal artery stenosis test status post stenting-blood pressure elevated.  Patient refuses any medications including Lasix.  4] chronic thrombocytopenia stable  5] former smoker quit smoking over 20 years ago    Discharge Instructions  Discharge Instructions    Call MD for:  persistant dizziness or light-headedness   Complete by:  As directed    Call MD for:  persistant nausea and vomiting   Complete by:  As directed    Call MD for:  severe uncontrolled pain   Complete by:  As directed    Call MD for:  temperature >100.4   Complete by:  As directed    Diet - low sodium heart healthy   Complete by:  As directed    Increase activity slowly   Complete by:  As directed      Allergies as of 03/30/2018      Reactions   Nuvigil [armodafinil] Anaphylaxis, Hives   Codeine Nausea And Vomiting   Lactose Intolerance (gi) Other (See Comments)   unspecified      Medication List    STOP taking these medications   HYDROcodone-acetaminophen 10-325 MG tablet Commonly known as:  NORCO   oxyCODONE 40 mg 12 hr tablet Commonly known as:  OXYCONTIN Replaced by:  oxyCODONE 5 MG immediate release tablet     TAKE these medications   albuterol 108 (90 Base) MCG/ACT inhaler Commonly known as:  PROVENTIL HFA;VENTOLIN HFA Inhale 2 puffs into the lungs every 6 (six) hours as needed for wheezing.    furosemide 40 MG tablet Commonly known as:  LASIX Take 1 tablet (40 mg total) by mouth daily.   lidocaine 5 % Commonly known as:  LIDODERM Place 1 patch onto the skin daily. Remove & Discard patch within 12 hours or as directed by MD   ondansetron 4 MG tablet Commonly known as:  ZOFRAN Take 1 tablet (4 mg total) by mouth every 6 (six) hours.   oxyCODONE 5 MG immediate release tablet Commonly known as:  ROXICODONE Take 1 tablet (5 mg total) by mouth every 8 (eight) hours as needed for up to 7 days for severe pain. Replaces:  oxyCODONE 40 mg 12 hr tablet   sertraline 100 MG tablet Commonly known as:  ZOLOFT Take 100 mg by mouth daily.      Follow-up Information    [facility] Follow up.   Specialty:  Family Medicine Contact information: [place] [address] [phone]        Rigoberto Noel, MD Follow up.   Specialty:  Pulmonary Disease  Contact information: [address] [place] [address] [phone]          Allergies  Allergen Reactions  . Nuvigil [Armodafinil] Anaphylaxis and Hives  . Codeine Nausea And Vomiting  . Lactose Intolerance (Gi) Other (See Comments)    unspecified    Consultations:   Procedures/Studies: Ct Abdomen Pelvis Wo Contrast  Result Date: 03/29/2018 CLINICAL DATA:  Mediastinal mass. Initial workup. History of hepatitis-C, cirrhosis, recurrent paracentesis. EXAM: CT ABDOMEN AND PELVIS WITHOUT CONTRAST TECHNIQUE: Multidetector CT imaging of the abdomen and pelvis was performed following the standard protocol without IV contrast. COMPARISON:  CT abdomen and pelvis January 24, 2013 FINDINGS: LOWER CHEST: 4 mm subsolid RIGHT lower lobe pulmonary nodules along the major fissure. LEFT lower lobe pulmonary nodules measuring to 9 mm. HEPATOBILIARY: Cirrhotic liver with TIPS. Layering density in the gallbladder most compatible with vicarious excretion of contrast. Enlarged main  portal vein. PANCREAS: Atrophic. SPLEEN: Splenomegaly, 18.3 cm in cranial caudad dimension increased from prior CT. 12 mm nodule adjacent to the spleen seen with splenial or lymph node. ADRENALS/URINARY TRACT: LEFT kidney is inferiorly displaced by splenomegaly. No nephrolithiasis, hydronephrosis; limited assessment for renal masses on this nonenhanced examination. 17 mm cyst RIGHT interpolar kidney, new from prior CT. Contrast excretion urinary collecting system. Urinary bladder is partially distended with contrast. Normal adrenal glands. STOMACH/BOWEL: Small hiatal hernia with surrounding varices. Moderate sigmoid colonic diverticulosis. Small and large bowel are normal in course and caliber. Normal appendix. VASCULAR/LYMPHATIC: Aortoiliac vessels are normal in course and caliber. Moderate calcific atherosclerosis. No lymphadenopathy by CT size criteria. REPRODUCTIVE: Normal. OTHER: Moderate ascites. MUSCULOSKELETAL: Non-acute. Mild rectus abdominis diastasis. Moderate to severe L5-S1 degenerative disc. IMPRESSION: 1. No abdominopelvic tumor, limited assessment by noncontrast CT. 2. Cirrhosis and sequelae of portal hypertension, status post TIPS. Moderate ascites. Aortic Atherosclerosis (ICD10-I70.0). Electronically Signed   By: Elon Alas M.D.   On: 03/29/2018 01:43   Dg Chest 2 View  Result Date: 03/28/2018 CLINICAL DATA:  Left-sided chest pain and shortness of breath for the past 5 days. EXAM: CHEST - 2 VIEW COMPARISON:  Chest x-ray dated May 24, 2015. FINDINGS: The heart size and mediastinal contours are within normal limits. Both lungs are clear. The visualized skeletal structures are unremarkable. IMPRESSION: No active cardiopulmonary disease. Electronically Signed   By: Titus Dubin M.D.   On: 03/28/2018 10:55   Ct Head Wo Contrast  Result Date: 03/29/2018 CLINICAL DATA:  Mediastinal mass. Assess for intracranial pathology. History of cirrhosis and hepatitis C. EXAM: CT HEAD WITHOUT  CONTRAST TECHNIQUE: Contiguous axial images were obtained from the base of the skull through the vertex without intravenous contrast. COMPARISON:  None. FINDINGS: BRAIN: No intraparenchymal hemorrhage, mass effect nor midline shift. The ventricles and sulci are normal for age. No acute large vascular territory infarcts. No abnormal extra-axial fluid collections. Basal cisterns are patent. VASCULAR: Mild calcific atherosclerosis of the carotid siphons. SKULL: No skull fracture. No significant scalp soft tissue swelling. SINUSES/ORBITS: The mastoid air-cells and included paranasal sinuses are well-aerated.The included ocular globes and orbital contents are non-suspicious. OTHER: None. IMPRESSION: Negative noncontrast CT HEAD for age. Electronically Signed   By: Elon Alas M.D.   On: 03/29/2018 01:24   Ct Angio Chest Pe W And/or Wo Contrast  Result Date: 03/28/2018 CLINICAL DATA:  Chest pain, shortness of breath. EXAM: CT ANGIOGRAPHY CHEST WITH CONTRAST TECHNIQUE: Multidetector CT imaging of the chest was performed using the standard protocol during bolus administration of intravenous contrast. Multiplanar CT image reconstructions  and MIPs were obtained to evaluate the vascular anatomy. CONTRAST:  127mL ISOVUE-370 IOPAMIDOL (ISOVUE-370) INJECTION 76% COMPARISON:  Radiographs of same day. FINDINGS: Cardiovascular: There is no evidence of large central pulmonary embolus. However, there is limited opacification of the peripheral branches, and therefore smaller peripheral pulmonary emboli cannot be excluded on the basis of this exam. There is no evidence of thoracic aortic dissection or aneurysm. No pericardial effusion is noted. Mediastinum/Nodes: Thyroid gland is unremarkable. 4.1 x 2.1 cm mass is noted in aortopulmonary window of mediastinum. 4.3 x 3.1 cm subcarinal mass is noted 1.9 cm right paratracheal lymph node is noted. Lungs/Pleura: No pneumothorax or pleural effusion is noted. Minimal bibasilar  subsegmental atelectasis is noted. 9 mm irregular nodule is noted in left lung base. 15 mm irregular density is noted medially in left lung base adjacent to distal thoracic aorta. Upper Abdomen: Hepatic cirrhosis is noted with TIPS stent. Severe splenomegaly is noted. Mild ascites is noted. Musculoskeletal: No chest wall abnormality. No acute or significant osseous findings. Review of the MIP images confirms the above findings. IMPRESSION: No definite evidence of large central pulmonary embolus is noted. However, due to limited opacification of the peripheral branches most likely due to issues related to timing of contrast bolus, smaller peripheral pulmonary emboli cannot be excluded on the basis of this exam. Mediastinal masses or adenopathy is noted concerning for malignancy. Also noted are 2 nodular densities in the left lung base, the largest measuring 15 mm. Malignancy and metastatic disease cannot be excluded, and PET scan is recommended for further evaluation. Hepatic cirrhosis with associated splenomegaly and TIPS stent is noted. Mild ascites is noted. Electronically Signed   By: Marijo Conception, M.D.   On: 03/28/2018 16:12   Ir Paracentesis  Result Date: 03/29/2018 INDICATION: Patient with history of hepatitis C cirrhosis and recurrent ascites. Request is made for therapeutic paracentesis. EXAM: ULTRASOUND GUIDED THERAPEUTIC PARACENTESIS MEDICATIONS: 10 mL of 2% lidocaine COMPLICATIONS: None immediate. PROCEDURE: Informed written consent was obtained from the patient after a discussion of the risks, benefits and alternatives to treatment. A timeout was performed prior to the initiation of the procedure. Initial ultrasound scanning demonstrates a small amount of ascites within the left lower abdominal quadrant. The left lower abdomen was prepped and draped in the usual sterile fashion. 2% lidocaine was used for local anesthesia. Following this, a 19 gauge, 7-cm, Yueh catheter was introduced. An  ultrasound image was saved for documentation purposes. The paracentesis was performed. The catheter was removed and a dressing was applied. The patient tolerated the procedure well without immediate post procedural complication. FINDINGS: A total of approximately 2.4 liters of clear gold fluid was removed. IMPRESSION: Successful ultrasound-guided paracentesis yielding 2.4 liters of peritoneal fluid. Read by: Earley Abide, PA-C Electronically Signed   By: Marybelle Killings M.D.   On: 03/29/2018 11:25   Ir Paracentesis  Result Date: 03/16/2018 INDICATION: Cirrhosis with recurrent ascites.  Request therapeutic paracentesis. EXAM: ULTRASOUND GUIDED RIGHT LOWER QUADRANT PARACENTESIS MEDICATIONS: None. COMPLICATIONS: None immediate. PROCEDURE: Informed written consent was obtained from the patient after a discussion of the risks, benefits and alternatives to treatment. A timeout was performed prior to the initiation of the procedure. Initial ultrasound scanning demonstrates a large amount of ascites within the right lower abdominal quadrant. The right lower abdomen was prepped and draped in the usual sterile fashion. 1% lidocaine with epinephrine was used for local anesthesia. Following this, a 19 gauge, 7-cm, Yueh catheter was introduced. An ultrasound image was saved for  documentation purposes. The paracentesis was performed. The catheter was removed and a dressing was applied. The patient tolerated the procedure well without immediate post procedural complication. FINDINGS: A total of approximately 5.1 L of clear yellow fluid was removed. IMPRESSION: Successful ultrasound-guided paracentesis yielding 5.1 liters of peritoneal fluid. Read by: Ascencion Dike PA-C Electronically Signed   By: Markus Daft M.D.   On: 03/16/2018 11:13    (Echo, Carotid, EGD, Colonoscopy, ERCP)    Subjective:   Discharge Exam: Vitals:   03/30/18 0004 03/30/18 0724  BP: (!) 180/90 (!) 172/90  Pulse: 68 70  Resp:  18  Temp:  98.1 F  (36.7 C)  SpO2:     Vitals:   03/29/18 2355 03/30/18 0004 03/30/18 0600 03/30/18 0724  BP: (!) 191/99 (!) 180/90  (!) 172/90  Pulse: 61 68  70  Resp: 19   18  Temp: 98 F (36.7 C)   98.1 F (36.7 C)  TempSrc: Oral   Oral  SpO2: 98%     Weight:   97.5 kg (214 lb 15.2 oz)   Height:        General: Pt is alert, awake, not in acute distress Cardiovascular: RRR, S1/S2 +, no rubs, no gallops Respiratory: CTA bilaterally, no wheezing, no rhonchi Abdominal: Soft, NT, ND, bowel sounds + Extremities: no edema, no cyanosis    The results of significant diagnostics from this hospitalization (including imaging, microbiology, ancillary and laboratory) are listed below for reference.     Microbiology: No results found for this or any previous visit (from the past 240 hour(s)).   Labs: BNP (last 3 results) No results for input(s): BNP in the last 8760 hours. Basic Metabolic Panel: Recent Labs  Lab 03/28/18 1012 03/28/18 1823 03/29/18 0229 03/30/18 0435  NA 136  --  137 138  K 4.0  --  3.9 4.6  CL 105  --  105 104  CO2 21*  --  24 25  GLUCOSE 134*  --  107* 119*  BUN 8  --  10 13  CREATININE 0.93  --  0.98 0.85  CALCIUM 9.3  --  8.9 9.1  MG  --  1.7 1.8 1.8  PHOS  --  2.8  --   --    Liver Function Tests: Recent Labs  Lab 03/29/18 0229 03/30/18 0435  AST 22 23  ALT 13* 14*  ALKPHOS 79 78  BILITOT 2.6* 2.3*  PROT 7.0 6.9  ALBUMIN 3.5 3.7   No results for input(s): LIPASE, AMYLASE in the last 168 hours. No results for input(s): AMMONIA in the last 168 hours. CBC: Recent Labs  Lab 03/28/18 1058 03/29/18 0229 03/30/18 0435  WBC 4.3 3.6* 3.6*  NEUTROABS  --   --  2.8  HGB 13.7 13.0 13.2  HCT 39.3 37.3* 38.0*  MCV 87.5 87.1 87.6  PLT 43* 35* 33*   Cardiac Enzymes: Recent Labs  Lab 03/28/18 1012  TROPONINI <0.03   BNP: Invalid input(s): POCBNP CBG: No results for input(s): GLUCAP in the last 168 hours. D-Dimer Recent Labs    03/28/18 1430  DDIMER  2.37*   Hgb A1c No results for input(s): HGBA1C in the last 72 hours. Lipid Profile No results for input(s): CHOL, HDL, LDLCALC, TRIG, CHOLHDL, LDLDIRECT in the last 72 hours. Thyroid function studies No results for input(s): TSH, T4TOTAL, T3FREE, THYROIDAB in the last 72 hours.  Invalid input(s): FREET3 Anemia work up No results for input(s): VITAMINB12, FOLATE, FERRITIN, TIBC, IRON, RETICCTPCT in  the last 72 hours. Urinalysis    Component Value Date/Time   COLORURINE AMBER (A) 05/24/2015 1239   APPEARANCEUR CLEAR 05/24/2015 1239   LABSPEC 1.015 05/24/2015 1239   PHURINE 5.5 05/24/2015 1239   GLUCOSEU NEGATIVE 05/24/2015 1239   HGBUR NEGATIVE 05/24/2015 1239   BILIRUBINUR NEGATIVE 05/24/2015 1239   KETONESUR NEGATIVE 05/24/2015 1239   PROTEINUR NEGATIVE 05/24/2015 1239   UROBILINOGEN 1.0 05/24/2015 1239   NITRITE NEGATIVE 05/24/2015 1239   LEUKOCYTESUR NEGATIVE 05/24/2015 1239   Sepsis Labs Invalid input(s): PROCALCITONIN,  WBC,  LACTICIDVEN Microbiology No results found for this or any previous visit (from the past 240 hour(s)).   Time coordinating discharge: Over 36minutes  SIGNED:   Georgette Shell, MD  [facility] 03/30/2018, 9:55 AM Pager   If 7PM-7AM, please contact night-coverage [email or web address] Password [email or web address]

## 2018-04-13 ENCOUNTER — Encounter: Payer: Self-pay | Admitting: Pulmonary Disease

## 2018-04-13 ENCOUNTER — Ambulatory Visit (INDEPENDENT_AMBULATORY_CARE_PROVIDER_SITE_OTHER): Payer: Medicare Other | Admitting: Pulmonary Disease

## 2018-04-13 DIAGNOSIS — R911 Solitary pulmonary nodule: Secondary | ICD-10-CM | POA: Diagnosis not present

## 2018-04-13 DIAGNOSIS — J9859 Other diseases of mediastinum, not elsewhere classified: Secondary | ICD-10-CM

## 2018-04-13 DIAGNOSIS — D6959 Other secondary thrombocytopenia: Secondary | ICD-10-CM

## 2018-04-13 MED ORDER — PHYTONADIONE 5 MG PO TABS: ORAL_TABLET | ORAL | 0 refills | Status: DC

## 2018-04-13 NOTE — Progress Notes (Signed)
   Subjective:    Patient ID: Adrian Compton, male    DOB: 28-Aug-1956, 62 y.o.   MRN: [id number]  HPI    Review of Systems  Constitutional: Positive for unexpected weight change.  HENT: Positive for sore throat and trouble swallowing.   Respiratory: Positive for shortness of breath.   Gastrointestinal: Positive for nausea.  Skin: Negative for rash.  Allergic/Immunologic: Negative.   Psychiatric/Behavioral: The patient is nervous/anxious.        Objective:   Physical Exam        Assessment & Plan:

## 2018-04-13 NOTE — Assessment & Plan Note (Signed)
Take vitamin K 5 mg oral once daily for 3 days prior to procedure He may need platelet transfusions prior to procedure

## 2018-04-13 NOTE — Progress Notes (Signed)
Subjective:    Patient ID: Adrian Compton, male    DOB: 1956-05-31, 62 y.o.   MRN: [id number]  HPI 62 year old man with chronic hep C cirrhosis and recurrent ascites presents for evaluation of mediastinal mass. He was admitted 03/2018 for substernal chest pain and left upper quadrant abdominal pain.  He also developed hoarseness of voice and abdominal distention. In the hospital, he underwent 2 L paracentesis, cytology negative.  D-dimer is elevated so CT angiogram was performed which showed mediastinal lymphadenopathy and left lower lobe nodules.  Hence the referral. Since discharge she continues to have significant pain, his chest pain seems to be much improved but the left upper quadrant abdominal pain persists and he has required narcotics from his PCP.  His hoarseness of voice is persisted  He has lost about 10 pounds over the past 6 weeks and reports poor appetite. He follows with Dr. Kennith Gain at [facility] in [place], underwent TI PS procedure about 6 years ago, last EGD was 4 years ago.  He is now requiring recurrent paracentesis almost every 2weeks he also has chronic back pain  Other significant medical problems include  bilateral renal artery stenosis test status post stenting &  chronic thrombocytopenia stable.His platelet counts were as high as 80k  in 2014 but have been in the 50 K range lately.  Had multiple surgeries. He is disabled for the last 6 years.  He smoked about 1 pack/day until he quit in 2006, more than 30 pack years. He is a family history of lung cancer in his mother and aunt, father died of complications of multiple sclerosis     Significant tests/ events reviewed   CTA of the chest 03/28/18 no PE,,2 mediastinal masses versus cluster of adenopathy as well as 2 nodular densities in the left lung base  measuring 15 mm & 11mm. The 2 mediastinal masses measure 4.1 x 2.1 cm at the aortopulmonary window and a 4.3 x 3.1 cm subcarinal mass with a 1.9 cm  right paratracheal lymph node   Past Medical History:  Diagnosis Date  . Chronic low back pain   . Cirrhosis of liver ([place])   . Cirrhosis of liver due to hepatitis C   . Hepatitis C   . Presence of right artificial knee joint      Past Surgical History:  Procedure Laterality Date  . BACK SURGERY    . IR GENERIC HISTORICAL  03/02/2017   IR PARACENTESIS 03/02/2017 [name], PA [facility]  . IR PARACENTESIS  04/01/2017  . IR PARACENTESIS  05/10/2017  . IR PARACENTESIS  06/06/2017  . IR PARACENTESIS  07/18/2017  . IR PARACENTESIS  08/12/2017  . IR PARACENTESIS  09/22/2017  . IR PARACENTESIS  11/04/2017  . IR PARACENTESIS  01/05/2018  . IR PARACENTESIS  02/10/2018  . IR PARACENTESIS  02/23/2018  . IR PARACENTESIS  03/16/2018  . IR PARACENTESIS  03/29/2018  . KNEE ARTHROPLASTY    . LAMINECTOMY       Allergies  Allergen Reactions  . Nuvigil [Armodafinil] Anaphylaxis and Hives  . Codeine Nausea And Vomiting  . Lactose Intolerance (Gi) Other (See Comments)    unspecified    Social History   Socioeconomic History  . Marital status: Married    Spouse name: Not on file  . Number of children: Not on file  . Years of education: Not on file  . Highest education level: Not on file  Occupational History  . Not on file  Social Needs  . Financial resource strain: Not on file  . Food insecurity:    Worry: Not on file    Inability: Not on file  . Transportation needs:    Medical: Not on file    Non-medical: Not on file  Tobacco Use  . Smoking status: Former Smoker    Last attempt to quit: 04/12/2005    Years since quitting: 13.0  . Smokeless tobacco: Former Systems developer    Types: Snuff    Quit date: 04/12/2005  Substance and Sexual Activity  . Alcohol use: No  . Drug use: No  . Sexual activity: Never  Lifestyle  . Physical activity:    Days per week: Not on file    Minutes per session: Not on file  . Stress: Not on file  Relationships  . Social connections:    Talks  on phone: Not on file    Gets together: Not on file    Attends religious service: Not on file    Active member of club or organization: Not on file    Attends meetings of clubs or organizations: Not on file    Relationship status: Not on file  . Intimate partner violence:    Fear of current or ex partner: Not on file    Emotionally abused: Not on file    Physically abused: Not on file    Forced sexual activity: Not on file  Other Topics Concern  . Not on file  Social History Narrative  . Not on file     Family History  Problem Relation Age of Onset  . Breast cancer Mother   . Diabetes type II Mother   . Cancer Father   . Multiple sclerosis Father   . Skin cancer Brother     Review of Systems Positive for shortness of breath with activity, nonproductive cough, substernal chest pain, left upper quadrant pain, weight loss of 10 pounds over 6 weeks, loss of appetite and anxiety over current diagnosis  Constitutional: negative for anorexia, fevers and sweats  Eyes: negative for irritation, redness and visual disturbance  Ears, nose, mouth, throat, and face: negative for earaches, epistaxis, nasal congestion and sore throat  Respiratory: negative for sputum and wheezing  Cardiovascular: negative for  lower extremity edema, orthopnea, palpitations and syncope  Gastrointestinal: negative for  constipation, diarrhea, melena, nausea and vomiting  Genitourinary:negative for dysuria, frequency and hematuria  Hematologic/lymphatic: negative for bleeding, easy bruising and lymphadenopathy  Musculoskeletal:negative for arthralgias, muscle weakness and stiff joints  Neurological: negative for coordination problems, gait problems, headaches and weakness  Endocrine: negative for diabetic symptoms including polydipsia, polyuria and weight loss     Objective:   Physical Exam   Gen. grumpy, tall, in some distress due to  pain,anxious affect ENT - pallor+s, no post nasal drip Neck: No JVD,  no thyromegaly, no carotid bruits Lungs: no use of accessory muscles, no dullness to percussion, clear without rales or rhonchi  Cardiovascular: Rhythm regular, heart sounds  normal, no murmurs or gallops, no peripheral edema Abdomen: soft , distended,tender LUQ,  BS normal. Musculoskeletal: No deformities, no cyanosis or clubbing Neuro:  alert, non focal        Assessment & Plan:

## 2018-04-13 NOTE — Assessment & Plan Note (Addendum)
Schedule PET scan Based on results, we will schedule biopsy-most likely bronchoscopy with ultrasound , we discussed risks and benefits under general anesthesia  We had a frank discussion of the possibilities including likely malignancy.  He also discussed that if so, this would indicate advanced lung malignancy not resectable and would likely need chemotherapy and he is likely a poor candidate.  He would still like to proceed with biopsy we discussed risks of biopsy especially in the light of his liver disease and chronic thrombocytopenia.

## 2018-04-13 NOTE — Patient Instructions (Signed)
Schedule PET scan Based on results, we will schedule biopsy-most likely bronchoscopy with ultrasound , we discussed risks and benefits under general anesthesia  Take vitamin K 5 mg oral once daily for 3 days prior to procedure He may need platelet transfusions prior to procedure

## 2018-04-13 NOTE — Assessment & Plan Note (Signed)
Concern for lung malignancy in this ex-smoker

## 2018-04-14 ENCOUNTER — Ambulatory Visit (HOSPITAL_COMMUNITY)
Admission: RE | Admit: 2018-04-14 | Discharge: 2018-04-14 | Disposition: A | Discharge status: Home / Self Care | Payer: Medicare Other | Source: Ambulatory Visit | Attending: Pulmonary Disease | Admitting: Pulmonary Disease

## 2018-04-14 DIAGNOSIS — Z79899 Other long term (current) drug therapy: Secondary | ICD-10-CM | POA: Insufficient documentation

## 2018-04-14 DIAGNOSIS — M899 Disorder of bone, unspecified: Secondary | ICD-10-CM | POA: Diagnosis not present

## 2018-04-14 DIAGNOSIS — R911 Solitary pulmonary nodule: Secondary | ICD-10-CM | POA: Insufficient documentation

## 2018-04-14 DIAGNOSIS — M8448XA Pathological fracture, other site, initial encounter for fracture: Secondary | ICD-10-CM | POA: Diagnosis not present

## 2018-04-14 DIAGNOSIS — R160 Hepatomegaly, not elsewhere classified: Secondary | ICD-10-CM | POA: Insufficient documentation

## 2018-04-14 DIAGNOSIS — C778 Secondary and unspecified malignant neoplasm of lymph nodes of multiple regions: Secondary | ICD-10-CM | POA: Insufficient documentation

## 2018-04-14 DIAGNOSIS — C7951 Secondary malignant neoplasm of bone: Secondary | ICD-10-CM | POA: Diagnosis not present

## 2018-04-14 LAB — GLUCOSE, CAPILLARY: GLUCOSE-CAPILLARY: 122 mg/dL — AB (ref 65–99)

## 2018-04-14 MED ORDER — FLUDEOXYGLUCOSE F - 18 (FDG) INJECTION
12.0000 | Freq: Once | INTRAVENOUS | Status: AC | PRN
  Administered 2018-04-14: 12 via INTRAVENOUS

## 2018-04-17 ENCOUNTER — Telehealth: Payer: Self-pay | Admitting: Pulmonary Disease

## 2018-04-17 ENCOUNTER — Other Ambulatory Visit: Payer: Self-pay | Admitting: Pulmonary Disease

## 2018-04-17 DIAGNOSIS — R932 Abnormal findings on diagnostic imaging of liver and biliary tract: Secondary | ICD-10-CM

## 2018-04-17 NOTE — Telephone Encounter (Signed)
I tried to call Adrian Compton back but their office was closed. Will try again in the AM.

## 2018-04-18 ENCOUNTER — Other Ambulatory Visit: Payer: Self-pay

## 2018-04-18 DIAGNOSIS — J9859 Other diseases of mediastinum, not elsewhere classified: Secondary | ICD-10-CM

## 2018-04-18 NOTE — Telephone Encounter (Signed)
Spoke with Adrian Compton, they need the OV notes from 5/9 and any other pertinent information. He is on the lung transplant list and the doctor needs this information. [phone]. Dr. Elsworth Soho, do you think he will still be eligible to be on the list? According to his PET scan, this may be questionable and if not I can advise the nurse. Spoke with Dr. Elsworth Soho verbally and he stated to send the information anyway so they can make the decision, Faxed to the number listed. Nothing further is needed.

## 2018-04-19 ENCOUNTER — Other Ambulatory Visit (HOSPITAL_COMMUNITY): Payer: Self-pay | Admitting: Gastroenterology

## 2018-04-19 ENCOUNTER — Encounter (HOSPITAL_COMMUNITY): Payer: Self-pay | Admitting: Radiology

## 2018-04-19 ENCOUNTER — Ambulatory Visit (HOSPITAL_COMMUNITY)
Admission: RE | Admit: 2018-04-19 | Discharge: 2018-04-19 | Disposition: A | Discharge status: Home / Self Care | Payer: Medicare Other | Source: Ambulatory Visit | Attending: Gastroenterology | Admitting: Gastroenterology

## 2018-04-19 DIAGNOSIS — R188 Other ascites: Secondary | ICD-10-CM | POA: Diagnosis not present

## 2018-04-19 HISTORY — PX: IR PARACENTESIS: IMG2679

## 2018-04-19 MED ORDER — LIDOCAINE HCL (PF) 2 % IJ SOLN
INTRAMUSCULAR | Status: AC
  Filled 2018-04-19: qty 20

## 2018-04-19 MED ORDER — ALBUMIN HUMAN 25 % IV SOLN
50.0000 g | Freq: Once | INTRAVENOUS | Status: AC
  Administered 2018-04-19: 50 g via INTRAVENOUS
  Filled 2018-04-19: qty 200

## 2018-04-19 MED ORDER — LIDOCAINE HCL 2 % IJ SOLN
INTRAMUSCULAR | Status: DC | PRN
  Administered 2018-04-19: 10 mL

## 2018-04-19 NOTE — Procedures (Signed)
   US guided LLQ paracentesis  4.2 L yellow fluid  Tolerated well

## 2018-04-21 ENCOUNTER — Other Ambulatory Visit: Payer: Self-pay | Admitting: Radiology

## 2018-04-24 ENCOUNTER — Telehealth: Payer: Self-pay | Admitting: Pulmonary Disease

## 2018-04-24 ENCOUNTER — Ambulatory Visit (HOSPITAL_COMMUNITY)
Admission: RE | Admit: 2018-04-24 | Discharge: 2018-04-24 | Disposition: A | Discharge status: Home / Self Care | Payer: Medicare Other | Source: Ambulatory Visit | Attending: Pulmonary Disease | Admitting: Pulmonary Disease

## 2018-04-24 DIAGNOSIS — K7460 Unspecified cirrhosis of liver: Secondary | ICD-10-CM | POA: Diagnosis not present

## 2018-04-24 DIAGNOSIS — C7951 Secondary malignant neoplasm of bone: Secondary | ICD-10-CM | POA: Diagnosis present

## 2018-04-24 DIAGNOSIS — Z885 Allergy status to narcotic agent status: Secondary | ICD-10-CM | POA: Diagnosis not present

## 2018-04-24 DIAGNOSIS — Z8619 Personal history of other infectious and parasitic diseases: Secondary | ICD-10-CM | POA: Insufficient documentation

## 2018-04-24 DIAGNOSIS — I700 Atherosclerosis of aorta: Secondary | ICD-10-CM | POA: Insufficient documentation

## 2018-04-24 DIAGNOSIS — Z87891 Personal history of nicotine dependence: Secondary | ICD-10-CM | POA: Insufficient documentation

## 2018-04-24 DIAGNOSIS — R188 Other ascites: Secondary | ICD-10-CM | POA: Diagnosis not present

## 2018-04-24 DIAGNOSIS — J9859 Other diseases of mediastinum, not elsewhere classified: Secondary | ICD-10-CM

## 2018-04-24 DIAGNOSIS — K766 Portal hypertension: Secondary | ICD-10-CM | POA: Insufficient documentation

## 2018-04-24 DIAGNOSIS — C778 Secondary and unspecified malignant neoplasm of lymph nodes of multiple regions: Secondary | ICD-10-CM | POA: Insufficient documentation

## 2018-04-24 DIAGNOSIS — B1920 Unspecified viral hepatitis C without hepatic coma: Secondary | ICD-10-CM | POA: Insufficient documentation

## 2018-04-24 DIAGNOSIS — Z79899 Other long term (current) drug therapy: Secondary | ICD-10-CM | POA: Insufficient documentation

## 2018-04-24 DIAGNOSIS — M5137 Other intervertebral disc degeneration, lumbosacral region: Secondary | ICD-10-CM | POA: Diagnosis not present

## 2018-04-24 LAB — CBC
HEMATOCRIT: 37.4 % — AB (ref 39.0–52.0)
HEMOGLOBIN: 12.6 g/dL — AB (ref 13.0–17.0)
MCH: 29.7 pg (ref 26.0–34.0)
MCHC: 33.7 g/dL (ref 30.0–36.0)
MCV: 88.2 fL (ref 78.0–100.0)
Platelets: 57 10*3/uL — ABNORMAL LOW (ref 150–400)
RBC: 4.24 MIL/uL (ref 4.22–5.81)
RDW: 15.2 % (ref 11.5–15.5)
WBC: 4.9 10*3/uL (ref 4.0–10.5)

## 2018-04-24 LAB — PROTIME-INR
INR: 1.39
Prothrombin Time: 17 seconds — ABNORMAL HIGH (ref 11.4–15.2)

## 2018-04-24 MED ORDER — SODIUM CHLORIDE 0.9 % IV SOLN: INTRAVENOUS | Status: DC

## 2018-04-24 MED ORDER — OXYCODONE HCL 5 MG PO TABS
ORAL_TABLET | ORAL | Status: DC
Start: 2018-04-24 — End: 2018-04-24
  Filled 2018-04-24: qty 2

## 2018-04-24 MED ORDER — MIDAZOLAM HCL 2 MG/2ML IJ SOLN
INTRAMUSCULAR | Status: AC
  Filled 2018-04-24: qty 4

## 2018-04-24 MED ORDER — FENTANYL CITRATE (PF) 100 MCG/2ML IJ SOLN
INTRAMUSCULAR | Status: AC
  Filled 2018-04-24: qty 4

## 2018-04-24 MED ORDER — LIDOCAINE HCL 1 % IJ SOLN
INTRAMUSCULAR | Status: AC
  Filled 2018-04-24: qty 20

## 2018-04-24 MED ORDER — MIDAZOLAM HCL 2 MG/2ML IJ SOLN
INTRAMUSCULAR | Status: AC | PRN
  Administered 2018-04-24: 1 mg via INTRAVENOUS

## 2018-04-24 MED ORDER — NALOXONE HCL 0.4 MG/ML IJ SOLN
INTRAMUSCULAR | Status: AC
  Filled 2018-04-24: qty 1

## 2018-04-24 MED ORDER — FENTANYL CITRATE (PF) 100 MCG/2ML IJ SOLN
INTRAMUSCULAR | Status: AC | PRN
  Administered 2018-04-24: 50 ug via INTRAVENOUS
  Administered 2018-04-24 (×2): 25 ug via INTRAVENOUS

## 2018-04-24 MED ORDER — OXYCODONE HCL ER 10 MG PO T12A
10.0000 mg | EXTENDED_RELEASE_TABLET | Freq: Once | ORAL | Status: DC
  Filled 2018-04-24: qty 1

## 2018-04-24 MED ORDER — FLUMAZENIL 0.5 MG/5ML IV SOLN
INTRAVENOUS | Status: AC
  Filled 2018-04-24: qty 5

## 2018-04-24 NOTE — H&P (Signed)
Chief Complaint: Patient was seen in consultation today for right iliac bone metastasis.  Referring Physician(s): Rigoberto Noel  Supervising Physician: Marybelle Killings  Patient Status: [name] - Out-pt  History of Present Illness: Adrian Compton is a 62 y.o. 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 He presented to ED 03/28/2018 with complaint of shortness of breath.  CTA chest 03/28/2018: 1. No definite evidence of large central pulmonary embolus is noted. However, due to limited opacification of the peripheral branches most likely due to issues related to timing of contrast bolus, smaller peripheral pulmonary emboli cannot be excluded on the basis of this exam. 2. Mediastinal masses or adenopathy is noted concerning for malignancy. Also noted are 2 nodular densities in the left lung base, the largest measuring 15 mm. Malignancy and metastatic disease cannot be excluded, and PET scan is recommended for further evaluation. 3. Hepatic cirrhosis with associated splenomegaly and TIPS stent is noted. Mild ascites is noted.  CT abdomen/pelvis 03/28/2018: 1. No abdominopelvic tumor, limited assessment by noncontrast CT. 2. Cirrhosis and sequelae of portal hypertension, status post TIPS. Moderate ascites. 3. Aortic Atherosclerosis (ICD10-I70.0).  NM PET 04/14/2018: 1. 1.6 mm medial left lower lobe pulmonary nodule, worrisome for primary bronchogenic neoplasm. 2. Associated thoracic nodal metastases. 3. 7.2 cm mass in the central right hepatic lobe/caudate, worrisome for hepatocellular carcinoma versus metastasis. 4. Additional upper abdominal nodal metastases. 5. Multifocal osseous metastases throughout the visualized axial and appendicular skeleton, with representative lesions as above, including pathologic fracture of the left posterolateral 9th rib. 6. Additional ancillary findings as above.  IR requested by Dr.  Elsworth Soho for possible image-guided right iliac bone metastasis biopsy. Patient awake and alert laying in bed with no complaints at this time. Accompanied by wife at bedside. Denies fever, chest pain, dyspnea, abdominal pain, or dizziness.  Past Medical History:  Diagnosis Date  . Chronic low back pain   . Cirrhosis of liver ([place])   . Cirrhosis of liver due to hepatitis C   . Hepatitis C   . Presence of right artificial knee joint     Past Surgical History:  Procedure Laterality Date  . BACK SURGERY    . IR GENERIC HISTORICAL  03/02/2017   IR PARACENTESIS 03/02/2017 [name], PA [facility]-INTER[facility] RAD  . IR PARACENTESIS  04/01/2017  . IR PARACENTESIS  05/10/2017  . IR PARACENTESIS  06/06/2017  . IR PARACENTESIS  07/18/2017  . IR PARACENTESIS  08/12/2017  . IR PARACENTESIS  09/22/2017  . IR PARACENTESIS  11/04/2017  . IR PARACENTESIS  01/05/2018  . IR PARACENTESIS  02/10/2018  . IR PARACENTESIS  02/23/2018  . IR PARACENTESIS  03/16/2018  . IR PARACENTESIS  03/29/2018  . IR PARACENTESIS  04/19/2018  . KNEE ARTHROPLASTY    . LAMINECTOMY      Allergies: Nuvigil [armodafinil]; Codeine; and Lactose intolerance (gi)  Medications: Prior to Admission medications   Medication Sig Start Date End Date Taking? Authorizing Provider  albuterol (PROVENTIL HFA;VENTOLIN HFA) 108 (90 BASE) MCG/ACT inhaler Inhale 2 puffs into the lungs every 6 (six) hours as needed for wheezing.   Yes [provider]  Lidocaine 4 % PTCH Apply 1 patch topically daily as needed (pain).   Yes [provider]  ondansetron (ZOFRAN) 4 MG tablet Take 1 tablet (4 mg total) by mouth every 6 (six) hours. Patient taking differently: Take 4 mg by mouth every 6 (six)  hours as needed.  06/06/14  Yes [name], [name], PA-C  Oxycodone HCl 10 MG TABS Take 10 mg by mouth every 8 (eight) hours.    Yes [provider]  phytonadione (VITAMIN K) 5 MG tablet Take one 5mg  tablet daily 3 days prior to procedure. 04/13/18   Yes Rigoberto Noel, MD  sertraline (ZOLOFT) 100 MG tablet Take 100 mg by mouth daily.   Yes [provider]     Family History  Problem Relation Age of Onset  . Breast cancer Mother   . Diabetes type II Mother   . Cancer Father   . Multiple sclerosis Father   . Skin cancer Brother     Social History   Socioeconomic History  . Marital status: Married    Spouse name: Not on file  . Number of children: Not on file  . Years of education: Not on file  . Highest education level: Not on file  Occupational History  . Not on file  Social Needs  . Financial resource strain: Not on file  . Food insecurity:    Worry: Not on file    Inability: Not on file  . Transportation needs:    Medical: Not on file    Non-medical: Not on file  Tobacco Use  . Smoking status: Former Smoker    Last attempt to quit: 04/12/2005    Years since quitting: 13.0  . Smokeless tobacco: Former Systems developer    Types: Snuff    Quit date: 04/12/2005  Substance and Sexual Activity  . Alcohol use: No  . Drug use: No  . Sexual activity: Never  Lifestyle  . Physical activity:    Days per week: Not on file    Minutes per session: Not on file  . Stress: Not on file  Relationships  . Social connections:    Talks on phone: Not on file    Gets together: Not on file    Attends religious service: Not on file    Active member of club or organization: Not on file    Attends meetings of clubs or organizations: Not on file    Relationship status: Not on file  Other Topics Concern  . Not on file  Social History Narrative  . Not on file     Review of Systems: A 12 point ROS discussed and pertinent positives are indicated in the HPI above.  All other systems are negative.  Review of Systems  Constitutional: Negative for activity change and fever.  Respiratory: Negative for shortness of breath and wheezing.   Cardiovascular: Negative for chest pain and palpitations.  Gastrointestinal: Negative for abdominal  pain.  Neurological: Negative for dizziness.  Psychiatric/Behavioral: Negative for behavioral problems and confusion.    Vital Signs: BP (!) 180/95   Pulse 81   Temp 98.3 F (36.8 C)   Ht 6\' 2"  (1.88 m)   Wt 217 lb (98.4 kg)   SpO2 97%   BMI 27.86 kg/m   Physical Exam  Constitutional: He is oriented to person, place, and time. He appears well-developed and well-nourished. No distress.  Cardiovascular: Normal rate, regular rhythm, normal heart sounds and intact distal pulses.  No murmur heard. Pulmonary/Chest: Effort normal and breath sounds normal. No respiratory distress. He has no wheezes.  Neurological: He is alert and oriented to person, place, and time.  Skin: Skin is warm and dry.  Psychiatric: He has a normal mood and affect. His behavior is normal. Judgment and thought content normal.  Nursing note and vitals reviewed.    MD Evaluation Airway: WNL Heart: WNL Abdomen: WNL Chest/ Lungs: WNL ASA  Classification: 4 Mallampati/Airway Score: One   Imaging: Ct Abdomen Pelvis Wo Contrast  Result Date: 03/29/2018 CLINICAL DATA:  Mediastinal mass. Initial workup. History of hepatitis-C, cirrhosis, recurrent paracentesis. EXAM: CT ABDOMEN AND PELVIS WITHOUT CONTRAST TECHNIQUE: Multidetector CT imaging of the abdomen and pelvis was performed following the standard protocol without IV contrast. COMPARISON:  CT abdomen and pelvis January 24, 2013 FINDINGS: LOWER CHEST: 4 mm subsolid RIGHT lower lobe pulmonary nodules along the major fissure. LEFT lower lobe pulmonary nodules measuring to 9 mm. HEPATOBILIARY: Cirrhotic liver with TIPS. Layering density in the gallbladder most compatible with vicarious excretion of contrast. Enlarged main portal vein. PANCREAS: Atrophic. SPLEEN: Splenomegaly, 18.3 cm in cranial caudad dimension increased from prior CT. 12 mm nodule adjacent to the spleen seen with splenial or lymph node. ADRENALS/URINARY TRACT: LEFT kidney is inferiorly displaced by  splenomegaly. No nephrolithiasis, hydronephrosis; limited assessment for renal masses on this nonenhanced examination. 17 mm cyst RIGHT interpolar kidney, new from prior CT. Contrast excretion urinary collecting system. Urinary bladder is partially distended with contrast. Normal adrenal glands. STOMACH/BOWEL: Small hiatal hernia with surrounding varices. Moderate sigmoid colonic diverticulosis. Small and large bowel are normal in course and caliber. Normal appendix. VASCULAR/LYMPHATIC: Aortoiliac vessels are normal in course and caliber. Moderate calcific atherosclerosis. No lymphadenopathy by CT size criteria. REPRODUCTIVE: Normal. OTHER: Moderate ascites. MUSCULOSKELETAL: Non-acute. Mild rectus abdominis diastasis. Moderate to severe L5-S1 degenerative disc. IMPRESSION: 1. No abdominopelvic tumor, limited assessment by noncontrast CT. 2. Cirrhosis and sequelae of portal hypertension, status post TIPS. Moderate ascites. Aortic Atherosclerosis (ICD10-I70.0). Electronically Signed   By: Elon Alas M.D.   On: 03/29/2018 01:43   Dg Chest 2 View  Result Date: 03/28/2018 CLINICAL DATA:  Left-sided chest pain and shortness of breath for the past 5 days. EXAM: CHEST - 2 VIEW COMPARISON:  Chest x-ray dated May 24, 2015. FINDINGS: The heart size and mediastinal contours are within normal limits. Both lungs are clear. The visualized skeletal structures are unremarkable. IMPRESSION: No active cardiopulmonary disease. Electronically Signed   By: Titus Dubin M.D.   On: 03/28/2018 10:55   Ct Head Wo Contrast  Result Date: 03/29/2018 CLINICAL DATA:  Mediastinal mass. Assess for intracranial pathology. History of cirrhosis and hepatitis C. EXAM: CT HEAD WITHOUT CONTRAST TECHNIQUE: Contiguous axial images were obtained from the base of the skull through the vertex without intravenous contrast. COMPARISON:  None. FINDINGS: BRAIN: No intraparenchymal hemorrhage, mass effect nor midline shift. The ventricles and  sulci are normal for age. No acute large vascular territory infarcts. No abnormal extra-axial fluid collections. Basal cisterns are patent. VASCULAR: Mild calcific atherosclerosis of the carotid siphons. SKULL: No skull fracture. No significant scalp soft tissue swelling. SINUSES/ORBITS: The mastoid air-cells and included paranasal sinuses are well-aerated.The included ocular globes and orbital contents are non-suspicious. OTHER: None. IMPRESSION: Negative noncontrast CT HEAD for age. Electronically Signed   By: Elon Alas M.D.   On: 03/29/2018 01:24   Ct Angio Chest Pe W And/or Wo Contrast  Result Date: 03/28/2018 CLINICAL DATA:  Chest pain, shortness of breath. EXAM: CT ANGIOGRAPHY CHEST WITH CONTRAST TECHNIQUE: Multidetector CT imaging of the chest was performed using the standard protocol during bolus administration of intravenous contrast. Multiplanar CT image reconstructions and MIPs were obtained to evaluate the vascular anatomy. CONTRAST:  127mL ISOVUE-370 IOPAMIDOL (ISOVUE-370) INJECTION 76% COMPARISON:  Radiographs of same day. FINDINGS: Cardiovascular: There  is no evidence of large central pulmonary embolus. However, there is limited opacification of the peripheral branches, and therefore smaller peripheral pulmonary emboli cannot be excluded on the basis of this exam. There is no evidence of thoracic aortic dissection or aneurysm. No pericardial effusion is noted. Mediastinum/Nodes: Thyroid gland is unremarkable. 4.1 x 2.1 cm mass is noted in aortopulmonary window of mediastinum. 4.3 x 3.1 cm subcarinal mass is noted 1.9 cm right paratracheal lymph node is noted. Lungs/Pleura: No pneumothorax or pleural effusion is noted. Minimal bibasilar subsegmental atelectasis is noted. 9 mm irregular nodule is noted in left lung base. 15 mm irregular density is noted medially in left lung base adjacent to distal thoracic aorta. Upper Abdomen: Hepatic cirrhosis is noted with TIPS stent. Severe splenomegaly  is noted. Mild ascites is noted. Musculoskeletal: No chest wall abnormality. No acute or significant osseous findings. Review of the MIP images confirms the above findings. IMPRESSION: No definite evidence of large central pulmonary embolus is noted. However, due to limited opacification of the peripheral branches most likely due to issues related to timing of contrast bolus, smaller peripheral pulmonary emboli cannot be excluded on the basis of this exam. Mediastinal masses or adenopathy is noted concerning for malignancy. Also noted are 2 nodular densities in the left lung base, the largest measuring 15 mm. Malignancy and metastatic disease cannot be excluded, and PET scan is recommended for further evaluation. Hepatic cirrhosis with associated splenomegaly and TIPS stent is noted. Mild ascites is noted. Electronically Signed   By: Marijo Conception, M.D.   On: 03/28/2018 16:12   Nm Pet Image Initial (pi) Skull Base To Thigh  Result Date: 04/14/2018 CLINICAL DATA:  Initial treatment strategy for pulmonary nodules. Mediastinal lymphadenopathy. EXAM: NUCLEAR MEDICINE PET SKULL BASE TO THIGH TECHNIQUE: 12 mCi F-18 FDG was injected intravenously. Full-ring PET imaging was performed from the skull base to thigh after the radiotracer. CT data was obtained and used for attenuation correction and anatomic localization. Fasting blood glucose: 122 mg/dl COMPARISON:  CT abdomen/pelvis dated 03/29/2018. CTA chest dated 03/28/2018. FINDINGS: Mediastinal blood pool activity: SUV max 3.1 NECK: No hypermetabolic cervical lymphadenopathy. Incidental CT findings: none CHEST: 1.6 cm irregular nodule in the medial left lower lobe (series 8/image 58), max SUV 4.1, worrisome for primary bronchogenic neoplasm. Thoracic lymphadenopathy, including: --1.8 cm short axis right paratracheal node (series 4/image 70), max SUV 6.7 --2.1 cm short axis AP window node (series 4/image 31), max SUV 7.4 --2.6 cm short axis subcarinal node (series  4/image 39), max SUV 8.4 --Left hilar node, max SUV 5.5 Incidental CT findings: Mild atherosclerotic calcifications of the aortic arch. ABDOMEN/PELVIS: Focal hypermetabolism in the central right hepatic lobe/caudate, with suspected underlying 7.2 x 4.9 cm mass on CT (series 4/image 106), max SUV 7.6. Given associated cirrhosis, differential considerations include hepatocellular carcinoma versus metastasis. Associated upper abdominal/portacaval nodes measuring up to 16 mm short axis (series 4/image 125), max SUV 7.5. Incidental CT findings: Splenomegaly. Tips shunt. Moderate abdominopelvic ascites. Atherosclerotic calcifications the abdominal aorta and branch vessels. Sigmoid diverticulosis, without evidence of diverticulitis. SKELETON: Multifocal osseous metastases throughout the visualized axial and appendicular skeleton. Representative lesions include: --Left humeral head, max SUV 6.0 --Left T1 vertebral body, max SUV 7.8 --Left posterolateral 9th rib with pathologic fracture (series 4/image 113), max SUV 5.2 --Left L2 vertebral body, max SUV 7.9 --Right sacrum, max SUV 6.6 Incidental CT findings: Degenerative changes of the visualized thoracolumbar spine. IMPRESSION: 1.6 mm medial left lower lobe pulmonary nodule, worrisome for primary  bronchogenic neoplasm. Associated thoracic nodal metastases. 7.2 cm mass in the central right hepatic lobe/caudate, worrisome for hepatocellular carcinoma versus metastasis. Additional upper abdominal nodal metastases. Multifocal osseous metastases throughout the visualized axial and appendicular skeleton, with representative lesions as above, including pathologic fracture of the left posterolateral 9th rib. Additional ancillary findings as above. Electronically Signed   By: Julian Hy M.D.   On: 04/14/2018 17:03   Ir Paracentesis  Result Date: 04/19/2018 INDICATION: Recurrent ascites EXAM: ULTRASOUND-GUIDED PARACENTESIS COMPARISON:  Previous paracentesis MEDICATIONS: 10  cc 2% lidocaine. COMPLICATIONS: None immediate. TECHNIQUE: Informed written consent was obtained from the patient after a discussion of the risks, benefits and alternatives to treatment. A timeout was performed prior to the initiation of the procedure. Initial ultrasound scanning demonstrates a large amount of ascites within the left lower abdominal quadrant. The left lower abdomen was prepped and draped in the usual sterile fashion. 1% lidocaine with epinephrine was used for local anesthesia. Under direct ultrasound guidance, a 19 gauge, 7-cm, Yueh catheter was introduced. An ultrasound image was saved for documentation purposed. The paracentesis was performed. The catheter was removed and a dressing was applied. The patient tolerated the procedure well without immediate post procedural complication. FINDINGS: A total of approximately 4.2 liters of yellow fluid was removed. IMPRESSION: Successful ultrasound-guided paracentesis yielding 4.2 liters of peritoneal fluid. Read by Lavonia Drafts [facility] Electronically Signed   By: Sandi Mariscal M.D.   On: 04/19/2018 14:10   Ir Paracentesis  Result Date: 03/29/2018 INDICATION: Patient with history of hepatitis C cirrhosis and recurrent ascites. Request is made for therapeutic paracentesis. EXAM: ULTRASOUND GUIDED THERAPEUTIC PARACENTESIS MEDICATIONS: 10 mL of 2% lidocaine COMPLICATIONS: None immediate. PROCEDURE: Informed written consent was obtained from the patient after a discussion of the risks, benefits and alternatives to treatment. A timeout was performed prior to the initiation of the procedure. Initial ultrasound scanning demonstrates a small amount of ascites within the left lower abdominal quadrant. The left lower abdomen was prepped and draped in the usual sterile fashion. 2% lidocaine was used for local anesthesia. Following this, a 19 gauge, 7-cm, Yueh catheter was introduced. An ultrasound image was saved for documentation purposes. The paracentesis was  performed. The catheter was removed and a dressing was applied. The patient tolerated the procedure well without immediate post procedural complication. FINDINGS: A total of approximately 2.4 liters of clear gold fluid was removed. IMPRESSION: Successful ultrasound-guided paracentesis yielding 2.4 liters of peritoneal fluid. Read by: Earley Abide, PA-C Electronically Signed   By: Marybelle Killings M.D.   On: 03/29/2018 11:25    Labs:  CBC: Recent Labs    03/28/18 1058 03/29/18 0229 03/30/18 0435 04/24/18 0946  WBC 4.3 3.6* 3.6* 4.9  HGB 13.7 13.0 13.2 12.6*  HCT 39.3 37.3* 38.0* 37.4*  PLT 43* 35* 33* PENDING    COAGS: Recent Labs    03/29/18 0229 04/24/18 0946  INR 1.52 1.39  APTT 35  --     BMP: Recent Labs    03/28/18 1012 03/29/18 0229 03/30/18 0435  NA 136 137 138  K 4.0 3.9 4.6  CL 105 105 104  CO2 21* 24 25  GLUCOSE 134* 107* 119*  BUN 8 10 13   CALCIUM 9.3 8.9 9.1  CREATININE 0.93 0.98 0.85  GFRNONAA >60 >60 >60  GFRAA >60 >60 >60    LIVER FUNCTION TESTS: Recent Labs    03/29/18 0229 03/30/18 0435  BILITOT 2.6* 2.3*  AST 22 23  ALT 13* 14*  ALKPHOS  79 78  PROT 7.0 6.9  ALBUMIN 3.5 3.7    TUMOR MARKERS: No results for input(s): AFPTM, CEA, CA199, CHROMGRNA in the last 8760 hours.  Assessment and Plan:  Right iliac bone metastasis. Plan for image-guided right iliac bone biopsy today with Dr. Barbie Banner. Patient is NPO. Denies fever and WBCs WNL. INR 1.39 seconds this AM.  Risks and benefits discussed with the patient including, but not limited to bleeding, infection, damage to adjacent structures or low yield requiring additional tests. All of the patient's questions were answered, patient is agreeable to proceed. Consent signed and in chart.  Thank you for this interesting consult.  I greatly enjoyed meeting KRISTIN LAMAGNA and look forward to participating in their care.  A copy of this report was sent to the requesting provider on this  date.  Electronically Signed: Earley Abide, PA-C 04/24/2018, 11:08 AM   I spent a total of 25 Minutes in face to face in clinical consultation, greater than 50% of which was counseling/coordinating care for right iliac bone metastasis.

## 2018-04-24 NOTE — Telephone Encounter (Signed)
CT biopsy of bone is ordered for 5/20 Pl make appt with oncology - Dr Earlie Server   Advanced cirrhotic requiring frequent paracentesis with low platelets.  He seems to have lung cancer with mets to mediastinum and bone. Large liver mass which could be a second primary?   Definitely NOT a candidate for chemotherapy.  Heavy smoker so doubt markers will be positive.  EBUS -high anesthesia risk   Easiest biopsy would be bone with minimal risk of bleeding/anesthesia etc.

## 2018-04-24 NOTE — Procedures (Signed)
Ct guided bone lesion Bx R iliac bone 11 g EBL 0 Comp 0

## 2018-04-24 NOTE — Discharge Instructions (Addendum)
Needle Biopsy, Care After Refer to this sheet in the next few weeks. These instructions provide you with information about caring for yourself after your procedure. Your health care provider may also give you more specific instructions. Your treatment has been planned according to current medical practices, but problems sometimes occur. Call your health care provider if you have any problems or questions after your procedure. What can I expect after the procedure? After your procedure, it is common to have soreness, bruising, or mild pain at the biopsy site. This should go away in a few days. Follow these instructions at home:  Rest as directed by your health care provider.  Take medicines only as directed by your health care provider.  There are many different ways to close and cover the biopsy site, including stitches (sutures), skin glue, and adhesive strips. Follow your health care provider's instructions about: ? Biopsy site care. ? Bandage (dressing) changes and removal. ? Biopsy site closure removal.  Check your biopsy site every day for signs of infection. Watch for: ? Redness, swelling, or pain. ? Fluid, blood, or pus. Contact a health care provider if:  You have a fever.  You have redness, swelling, or pain at the biopsy site that lasts longer than a few days.  You have fluid, blood, or pus coming from the biopsy site.  You feel nauseous.  You vomit. Get help right away if:  You have shortness of breath.  You have trouble breathing.  You have chest pain.  You feel dizzy or you faint.  You have bleeding that does not stop with pressure or a bandage.  You cough up blood.  You have pain in your abdomen. This information is not intended to replace advice given to you by your health care provider. Make sure you discuss any questions you have with your health care provider. Document Released: 04/08/2015 Document Revised: 04/29/2016 Document Reviewed:  11/18/2014 Elsevier Interactive Patient Education  2018 [place]. Moderate Conscious Sedation, Adult, Care After These instructions provide you with information about caring for yourself after your procedure. Your health care provider may also give you more specific instructions. Your treatment has been planned according to current medical practices, but problems sometimes occur. Call your health care provider if you have any problems or questions after your procedure. What can I expect after the procedure? After your procedure, it is common:  To feel sleepy for several hours.  To feel clumsy and have poor balance for several hours.  To have poor judgment for several hours.  To vomit if you eat too soon.  Follow these instructions at home: For at least 24 hours after the procedure:   Do not: ? Participate in activities where you could fall or become injured. ? Drive. ? Use heavy machinery. ? Drink alcohol. ? Take sleeping pills or medicines that cause drowsiness. ? Make important decisions or sign legal documents. ? Take care of children on your own.  Rest. Eating and drinking  Follow the diet recommended by your health care provider.  If you vomit: ? Drink water, juice, or soup when you can drink without vomiting. ? Make sure you have little or no nausea before eating solid foods. General instructions  Have a responsible adult stay with you until you are awake and alert.  Take over-the-counter and prescription medicines only as told by your health care provider.  If you smoke, do not smoke without supervision.  Keep all follow-up visits as told by your health care  provider. This is important. Contact a health care provider if:  You keep feeling nauseous or you keep vomiting.  You feel light-headed.  You develop a rash.  You have a fever. Get help right away if:  You have trouble breathing. This information is not intended to replace advice given to you  by your health care provider. Make sure you discuss any questions you have with your health care provider. Document Released: 09/12/2013 Document Revised: 04/26/2016 Document Reviewed: 03/13/2016 Elsevier Interactive Patient Education  [employer].

## 2018-04-24 NOTE — Progress Notes (Signed)
PA paged for patient requesting home dose of pain medication.  [place] for one dose of home OxyContin 10 mg. Order placed.   Brynda Greathouse, MS RD PA-C 12:30 PM

## 2018-04-27 ENCOUNTER — Other Ambulatory Visit: Payer: Self-pay

## 2018-04-27 DIAGNOSIS — C801 Malignant (primary) neoplasm, unspecified: Secondary | ICD-10-CM

## 2018-04-27 DIAGNOSIS — C3492 Malignant neoplasm of unspecified part of left bronchus or lung: Secondary | ICD-10-CM

## 2018-04-28 ENCOUNTER — Encounter: Payer: Self-pay | Admitting: Internal Medicine

## 2018-04-28 ENCOUNTER — Telehealth: Payer: Self-pay | Admitting: Internal Medicine

## 2018-04-28 NOTE — Telephone Encounter (Signed)
Referral received from [facility] for dx of adenocarcinoma. Tc to the pt's wife to schedule an appt. Pt has been scheduled for the pt to see Dr. Julien Nordmann on 6/3 at 215pm. Aware that her husband should arrive 30 minutes early. Letter mailed.

## 2018-05-02 ENCOUNTER — Inpatient Hospital Stay (HOSPITAL_COMMUNITY)
Admission: EM | Admit: 2018-05-02 | Discharge: 2018-05-11 | DRG: 543 | Disposition: A | Discharge status: Hospice | Payer: Medicare Other | Attending: Internal Medicine | Admitting: Internal Medicine

## 2018-05-02 ENCOUNTER — Emergency Department (HOSPITAL_COMMUNITY): Payer: Medicare Other

## 2018-05-02 ENCOUNTER — Encounter (HOSPITAL_COMMUNITY): Payer: Self-pay

## 2018-05-02 DIAGNOSIS — Z7682 Awaiting organ transplant status: Secondary | ICD-10-CM

## 2018-05-02 DIAGNOSIS — Z808 Family history of malignant neoplasm of other organs or systems: Secondary | ICD-10-CM

## 2018-05-02 DIAGNOSIS — Z66 Do not resuscitate: Secondary | ICD-10-CM | POA: Diagnosis present

## 2018-05-02 DIAGNOSIS — M549 Dorsalgia, unspecified: Secondary | ICD-10-CM

## 2018-05-02 DIAGNOSIS — M4856XA Collapsed vertebra, not elsewhere classified, lumbar region, initial encounter for fracture: Secondary | ICD-10-CM | POA: Diagnosis present

## 2018-05-02 DIAGNOSIS — Z885 Allergy status to narcotic agent status: Secondary | ICD-10-CM

## 2018-05-02 DIAGNOSIS — R590 Localized enlarged lymph nodes: Secondary | ICD-10-CM | POA: Diagnosis present

## 2018-05-02 DIAGNOSIS — Z6825 Body mass index (BMI) 25.0-25.9, adult: Secondary | ICD-10-CM

## 2018-05-02 DIAGNOSIS — Z96651 Presence of right artificial knee joint: Secondary | ICD-10-CM | POA: Diagnosis present

## 2018-05-02 DIAGNOSIS — R030 Elevated blood-pressure reading, without diagnosis of hypertension: Secondary | ICD-10-CM | POA: Diagnosis present

## 2018-05-02 DIAGNOSIS — K7460 Unspecified cirrhosis of liver: Secondary | ICD-10-CM | POA: Diagnosis present

## 2018-05-02 DIAGNOSIS — B182 Chronic viral hepatitis C: Secondary | ICD-10-CM | POA: Diagnosis present

## 2018-05-02 DIAGNOSIS — Z79899 Other long term (current) drug therapy: Secondary | ICD-10-CM

## 2018-05-02 DIAGNOSIS — C7951 Secondary malignant neoplasm of bone: Secondary | ICD-10-CM | POA: Diagnosis not present

## 2018-05-02 DIAGNOSIS — D6959 Other secondary thrombocytopenia: Secondary | ICD-10-CM | POA: Diagnosis present

## 2018-05-02 DIAGNOSIS — C220 Liver cell carcinoma: Secondary | ICD-10-CM | POA: Diagnosis present

## 2018-05-02 DIAGNOSIS — Z87891 Personal history of nicotine dependence: Secondary | ICD-10-CM

## 2018-05-02 DIAGNOSIS — Z888 Allergy status to other drugs, medicaments and biological substances status: Secondary | ICD-10-CM

## 2018-05-02 DIAGNOSIS — S32030A Wedge compression fracture of third lumbar vertebra, initial encounter for closed fracture: Secondary | ICD-10-CM | POA: Diagnosis present

## 2018-05-02 DIAGNOSIS — C7800 Secondary malignant neoplasm of unspecified lung: Secondary | ICD-10-CM | POA: Diagnosis present

## 2018-05-02 DIAGNOSIS — Z923 Personal history of irradiation: Secondary | ICD-10-CM

## 2018-05-02 DIAGNOSIS — R161 Splenomegaly, not elsewhere classified: Secondary | ICD-10-CM | POA: Diagnosis present

## 2018-05-02 DIAGNOSIS — K766 Portal hypertension: Secondary | ICD-10-CM | POA: Diagnosis present

## 2018-05-02 DIAGNOSIS — Z515 Encounter for palliative care: Secondary | ICD-10-CM | POA: Diagnosis not present

## 2018-05-02 DIAGNOSIS — E440 Moderate protein-calorie malnutrition: Secondary | ICD-10-CM | POA: Diagnosis present

## 2018-05-02 DIAGNOSIS — R188 Other ascites: Secondary | ICD-10-CM | POA: Diagnosis present

## 2018-05-02 DIAGNOSIS — M4850XA Collapsed vertebra, not elsewhere classified, site unspecified, initial encounter for fracture: Secondary | ICD-10-CM | POA: Diagnosis present

## 2018-05-02 DIAGNOSIS — Z91011 Allergy to milk products: Secondary | ICD-10-CM

## 2018-05-02 LAB — I-STAT TROPONIN, ED: Troponin i, poc: 0.01 ng/mL (ref 0.00–0.08)

## 2018-05-02 MED ORDER — HYDROMORPHONE HCL 1 MG/ML IJ SOLN
1.0000 mg | Freq: Once | INTRAMUSCULAR | Status: AC
  Administered 2018-05-02: 1 mg via INTRAVENOUS
  Filled 2018-05-02: qty 1

## 2018-05-02 MED ORDER — ONDANSETRON HCL 4 MG/2ML IJ SOLN
4.0000 mg | Freq: Once | INTRAMUSCULAR | Status: AC
  Administered 2018-05-02: 4 mg via INTRAVENOUS
  Filled 2018-05-02: qty 2

## 2018-05-02 NOTE — ED Notes (Signed)
Pt prefers to be stuck x1 for IV rather than separate for IV and/or straight stick for labs.

## 2018-05-02 NOTE — ED Triage Notes (Signed)
Pt arrived with complaints of abdominal pain from the center of his abdomen radiates to the left side. Pt states he has stage 4 liver cancer and has been waiting for a transplant. Takes medication at home but has stopped helping. Does mention ongoing shortness of breath over the last few weeks.

## 2018-05-02 NOTE — ED Provider Notes (Signed)
[facility] DEPT Provider Note   CSN: [id number] Arrival date & time: 05/02/18  2102     History   Chief Complaint Chief Complaint  Patient presents with  . Abdominal Pain    HPI Adrian Compton is a 62 y.o. male.  Patient with self-reported history of liver cancer with metastases presents to the emergency department with chief complaint of chronic pain.  He reports worsening abdominal and low back pain over the past several months.  States that his symptoms have worsened significantly since having a biopsy in his low back earlier this week.  He has tried controlling his pain with oxycodone with no relief.  He denies any fevers or chills.  He states that the pain takes his breath away.  He reports that his abdomen does seem distended, but is not as bad as it has been in the past when he has required paracenteses.  He also reports feeling hoarse.  He is DNR.  He has not been seen by oncology yet, but is going to be seen on Monday by Dr. Julien Nordmann.  The history is provided by the patient. No language interpreter was used.    Past Medical History:  Diagnosis Date  . Chronic low back pain   . Cirrhosis of liver ([place])   . Cirrhosis of liver due to hepatitis C   . Hepatitis C   . Presence of right artificial knee joint     Patient Active Problem List   Diagnosis Date Noted  . Pulmonary nodule 04/13/2018  . Chronic low back pain 03/28/2018  . Mediastinal mass 03/28/2018  . Hepatitis C (treated) 03/28/2018  . Cirrhosis of liver due to hepatitis C 03/28/2018  . Presence of right artificial knee joint 03/28/2018  . Unintended weight loss 03/28/2018  . Pleuritic chest pain 03/28/2018  . Hoarseness 03/28/2018  . Altered mental status   . Hepatic encephalopathy ([place]) 05/24/2015  . Chronic pain 05/24/2015  . Ascites 01/24/2013  . Hypotension, iatrogenic 01/24/2013  . Leukocytosis 01/24/2013  . Dilutional hyponatremia due to cirrhosis 01/24/2013    . Umbilical hernia 69/48/5462  . Azotemia 01/24/2013  . Anemia in chronic illness 01/24/2013  . 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 Presumed SBP (spontaneous bacterial peritonitis) 01/24/2013  . Abdominal pain 01/23/2013  . Weakness 01/23/2013  . Hepatic cirrhosis due to chronic hepatitis C infection ([place]) 01/23/2013    Past Surgical History:  Procedure Laterality Date  . BACK SURGERY    . IR GENERIC HISTORICAL  03/02/2017   IR PARACENTESIS 03/02/2017 [name], PA [facility]  . IR PARACENTESIS  04/01/2017  . IR PARACENTESIS  05/10/2017  . IR PARACENTESIS  06/06/2017  . IR PARACENTESIS  07/18/2017  . IR PARACENTESIS  08/12/2017  . IR PARACENTESIS  09/22/2017  . IR PARACENTESIS  11/04/2017  . IR PARACENTESIS  01/05/2018  . IR PARACENTESIS  02/10/2018  . IR PARACENTESIS  02/23/2018  . IR PARACENTESIS  03/16/2018  . IR PARACENTESIS  03/29/2018  . IR PARACENTESIS  04/19/2018  . KNEE ARTHROPLASTY    . LAMINECTOMY          Home Medications    Prior to Admission medications   Medication Sig Start Date End Date Taking? Authorizing Provider  albuterol (PROVENTIL HFA;VENTOLIN HFA) 108 (90 BASE) MCG/ACT inhaler Inhale 2 puffs into the lungs every 6 (six) hours as needed for wheezing.   Yes [provider]  Lidocaine 4 % PTCH Apply 1 patch topically  daily as needed (pain).   Yes [provider]  ondansetron (ZOFRAN) 4 MG tablet Take 1 tablet (4 mg total) by mouth every 6 (six) hours. Patient taking differently: Take 4 mg by mouth every 6 (six) hours as needed.  06/06/14  Yes [name], [name], PA-C  Oxycodone HCl 10 MG TABS Take 10 mg by mouth every 8 (eight) hours.    Yes [provider]  sertraline (ZOLOFT) 100 MG tablet Take 100 mg by mouth daily.   Yes [provider]  phytonadione (VITAMIN K) 5 MG tablet Take one 5mg  tablet daily 3 days prior to procedure. Patient not taking: Reported on 05/02/2018 04/13/18   Rigoberto Noel, MD    Family History Family History  Problem Relation Age of Onset  . Breast cancer Mother   . Diabetes type II Mother   . Cancer Father   . Multiple sclerosis Father   . Skin cancer Brother     Social History Social History   Tobacco Use  . Smoking status: Former Smoker    Last attempt to quit: 04/12/2005    Years since quitting: 13.0  . Smokeless tobacco: Former Systems developer    Types: Snuff    Quit date: 04/12/2005  Substance Use Topics  . Alcohol use: No  . Drug use: No     Allergies   Nuvigil [armodafinil]; Codeine; and Lactose intolerance (gi)   Review of Systems Review of Systems  All other systems reviewed and are negative.    Physical Exam Updated Vital Signs BP (!) 148/75 (BP Location: Left Arm)   Pulse 82   Temp 98.1 F (36.7 C) (Oral)   Resp 15   Ht 6\' 3"  (1.905 m)   Wt 98.9 kg (218 lb)   SpO2 95%   BMI 27.25 kg/m   Physical Exam  Constitutional: He is oriented to person, place, and time. He appears well-developed and well-nourished.  HENT:  Head: Normocephalic and atraumatic.  Eyes: Pupils are equal, round, and reactive to light. Conjunctivae and EOM are normal. Right eye exhibits no discharge. Left eye exhibits no discharge. No scleral icterus.  Neck: Normal range of motion. Neck supple. No JVD present.  Cardiovascular: Normal rate, regular rhythm and normal heart sounds. Exam reveals no gallop and no friction rub.  No murmur heard. Pulmonary/Chest: Effort normal and breath sounds normal. No respiratory distress. He has no wheezes. He has no rales. He exhibits no tenderness.  Abdominal: Soft. He exhibits no distension and no mass. There is no tenderness. There is no rebound and no guarding.  Abdomen is distended, but not focally tender  Musculoskeletal: Normal range of motion. He exhibits no edema or tenderness.  Neurological: He is alert and oriented to person, place, and time.  Skin: Skin is warm and dry.  Contusion over lumbar spine with no  evidence of infection  Psychiatric: He has a normal mood and affect. His behavior is normal. Judgment and thought content normal.  Nursing note and vitals reviewed.    ED Treatments / Results  Labs (all labs ordered are listed, but only abnormal results are displayed) Labs Reviewed - No data to display  EKG None  Radiology No results found.  Procedures Procedures (including critical care time)  Medications Ordered in ED Medications  HYDROmorphone (DILAUDID) injection 1 mg (has no administration in time range)  ondansetron (ZOFRAN) injection 4 mg (has no administration in time range)     Initial Impression / Assessment and Plan / ED Course  I have reviewed the triage vital signs and the nursing notes.  Pertinent labs & imaging results that were available during my care of the patient were reviewed by me and considered in my medical decision making (see chart for details).     Patient with recent diagnosis of probable hepatocellular carcinoma with metastases presents with low back pain and abdominal pain.  CT imaging of low back shows new L3 pathologic fracture.  He is neurovascularly intact, and has no deficits in his lower extremities.  He is afebrile.  He does report abdominal distention and feels like it is hard to breathe because of his abdominal distention.  He is not hypoxic nor tachypneic.  He has been difficult to adequately control his pain.  I discussed patient with the hospitalist service, who will admit the patient.  Also recommends consultation with oncology.  I discussed with oncology, who recommends MRI in the morning and repeat consultation with Dr. Julien Nordmann in the morning.  Final Clinical Impressions(s) / ED Diagnoses   Final diagnoses:  Back pain    ED Discharge Orders    None       Montine Circle, PA-C 05/03/18 0335    Sherwood Gambler, MD 05/10/18 [phone]

## 2018-05-02 NOTE — ED Notes (Signed)
Patient transported to X-ray 

## 2018-05-03 ENCOUNTER — Inpatient Hospital Stay (HOSPITAL_COMMUNITY): Payer: Medicare Other

## 2018-05-03 ENCOUNTER — Other Ambulatory Visit (HOSPITAL_COMMUNITY): Payer: Medicare Other

## 2018-05-03 ENCOUNTER — Other Ambulatory Visit: Payer: Self-pay

## 2018-05-03 ENCOUNTER — Encounter (HOSPITAL_COMMUNITY): Payer: Self-pay

## 2018-05-03 ENCOUNTER — Emergency Department (HOSPITAL_COMMUNITY): Payer: Medicare Other

## 2018-05-03 DIAGNOSIS — C787 Secondary malignant neoplasm of liver and intrahepatic bile duct: Secondary | ICD-10-CM | POA: Diagnosis not present

## 2018-05-03 DIAGNOSIS — C259 Malignant neoplasm of pancreas, unspecified: Secondary | ICD-10-CM | POA: Diagnosis not present

## 2018-05-03 DIAGNOSIS — D6959 Other secondary thrombocytopenia: Secondary | ICD-10-CM | POA: Diagnosis present

## 2018-05-03 DIAGNOSIS — Z923 Personal history of irradiation: Secondary | ICD-10-CM | POA: Diagnosis not present

## 2018-05-03 DIAGNOSIS — Z79899 Other long term (current) drug therapy: Secondary | ICD-10-CM | POA: Diagnosis not present

## 2018-05-03 DIAGNOSIS — C220 Liver cell carcinoma: Secondary | ICD-10-CM | POA: Diagnosis present

## 2018-05-03 DIAGNOSIS — S32030A Wedge compression fracture of third lumbar vertebra, initial encounter for closed fracture: Secondary | ICD-10-CM | POA: Diagnosis not present

## 2018-05-03 DIAGNOSIS — C7951 Secondary malignant neoplasm of bone: Secondary | ICD-10-CM | POA: Diagnosis present

## 2018-05-03 DIAGNOSIS — Z885 Allergy status to narcotic agent status: Secondary | ICD-10-CM | POA: Diagnosis not present

## 2018-05-03 DIAGNOSIS — M549 Dorsalgia, unspecified: Secondary | ICD-10-CM

## 2018-05-03 DIAGNOSIS — R52 Pain, unspecified: Secondary | ICD-10-CM | POA: Diagnosis not present

## 2018-05-03 DIAGNOSIS — Z809 Family history of malignant neoplasm, unspecified: Secondary | ICD-10-CM | POA: Diagnosis not present

## 2018-05-03 DIAGNOSIS — M545 Low back pain: Secondary | ICD-10-CM | POA: Diagnosis not present

## 2018-05-03 DIAGNOSIS — C781 Secondary malignant neoplasm of mediastinum: Secondary | ICD-10-CM | POA: Diagnosis not present

## 2018-05-03 DIAGNOSIS — M4850XA Collapsed vertebra, not elsewhere classified, site unspecified, initial encounter for fracture: Secondary | ICD-10-CM | POA: Diagnosis present

## 2018-05-03 DIAGNOSIS — Z96651 Presence of right artificial knee joint: Secondary | ICD-10-CM | POA: Diagnosis present

## 2018-05-03 DIAGNOSIS — Z803 Family history of malignant neoplasm of breast: Secondary | ICD-10-CM | POA: Diagnosis not present

## 2018-05-03 DIAGNOSIS — E440 Moderate protein-calorie malnutrition: Secondary | ICD-10-CM | POA: Diagnosis present

## 2018-05-03 DIAGNOSIS — Z66 Do not resuscitate: Secondary | ICD-10-CM | POA: Diagnosis present

## 2018-05-03 DIAGNOSIS — R590 Localized enlarged lymph nodes: Secondary | ICD-10-CM | POA: Diagnosis present

## 2018-05-03 DIAGNOSIS — M8458XA Pathological fracture in neoplastic disease, other specified site, initial encounter for fracture: Secondary | ICD-10-CM | POA: Diagnosis not present

## 2018-05-03 DIAGNOSIS — C7800 Secondary malignant neoplasm of unspecified lung: Secondary | ICD-10-CM | POA: Diagnosis present

## 2018-05-03 DIAGNOSIS — M4856XA Collapsed vertebra, not elsewhere classified, lumbar region, initial encounter for fracture: Secondary | ICD-10-CM | POA: Diagnosis present

## 2018-05-03 DIAGNOSIS — R188 Other ascites: Secondary | ICD-10-CM

## 2018-05-03 DIAGNOSIS — B1920 Unspecified viral hepatitis C without hepatic coma: Secondary | ICD-10-CM | POA: Diagnosis not present

## 2018-05-03 DIAGNOSIS — R030 Elevated blood-pressure reading, without diagnosis of hypertension: Secondary | ICD-10-CM | POA: Diagnosis present

## 2018-05-03 DIAGNOSIS — Z888 Allergy status to other drugs, medicaments and biological substances status: Secondary | ICD-10-CM | POA: Diagnosis not present

## 2018-05-03 DIAGNOSIS — R161 Splenomegaly, not elsewhere classified: Secondary | ICD-10-CM | POA: Diagnosis present

## 2018-05-03 DIAGNOSIS — Z7682 Awaiting organ transplant status: Secondary | ICD-10-CM | POA: Diagnosis not present

## 2018-05-03 DIAGNOSIS — B182 Chronic viral hepatitis C: Secondary | ICD-10-CM | POA: Diagnosis not present

## 2018-05-03 DIAGNOSIS — Z7189 Other specified counseling: Secondary | ICD-10-CM | POA: Diagnosis not present

## 2018-05-03 DIAGNOSIS — Z91011 Allergy to milk products: Secondary | ICD-10-CM | POA: Diagnosis not present

## 2018-05-03 DIAGNOSIS — Z515 Encounter for palliative care: Secondary | ICD-10-CM | POA: Diagnosis not present

## 2018-05-03 DIAGNOSIS — K7460 Unspecified cirrhosis of liver: Secondary | ICD-10-CM | POA: Diagnosis present

## 2018-05-03 DIAGNOSIS — Z808 Family history of malignant neoplasm of other organs or systems: Secondary | ICD-10-CM | POA: Diagnosis not present

## 2018-05-03 DIAGNOSIS — I517 Cardiomegaly: Secondary | ICD-10-CM | POA: Diagnosis not present

## 2018-05-03 DIAGNOSIS — K766 Portal hypertension: Secondary | ICD-10-CM | POA: Diagnosis present

## 2018-05-03 DIAGNOSIS — Z87891 Personal history of nicotine dependence: Secondary | ICD-10-CM | POA: Diagnosis not present

## 2018-05-03 LAB — CBC WITH DIFFERENTIAL/PLATELET
Basophils Absolute: 0 10*3/uL (ref 0.0–0.1)
Basophils Absolute: 0 10*3/uL (ref 0.0–0.1)
Basophils Relative: 0 %
Basophils Relative: 0 %
EOS PCT: 2 %
Eosinophils Absolute: 0.2 10*3/uL (ref 0.0–0.7)
Eosinophils Absolute: 0.1 10*3/uL (ref 0.0–0.7)
Eosinophils Relative: 2 %
HCT: 36.8 % — ABNORMAL LOW (ref 39.0–52.0)
HCT: 36.2 % — ABNORMAL LOW (ref 39.0–52.0)
HEMOGLOBIN: 12.6 g/dL — AB (ref 13.0–17.0)
Hemoglobin: 12.6 g/dL — ABNORMAL LOW (ref 13.0–17.0)
LYMPHS ABS: 0.4 10*3/uL — AB (ref 0.7–4.0)
LYMPHS ABS: 0.4 10*3/uL — AB (ref 0.7–4.0)
LYMPHS PCT: 7 %
LYMPHS PCT: 7 %
MCH: 29.8 pg (ref 26.0–34.0)
MCH: 30.7 pg (ref 26.0–34.0)
MCHC: 34.2 g/dL (ref 30.0–36.0)
MCHC: 34.8 g/dL (ref 30.0–36.0)
MCV: 87 fL (ref 78.0–100.0)
MCV: 88.1 fL (ref 78.0–100.0)
MONOS PCT: 7 %
Monocytes Absolute: 0.5 10*3/uL (ref 0.1–1.0)
Monocytes Absolute: 0.5 10*3/uL (ref 0.1–1.0)
Monocytes Relative: 8 %
NEUTROS PCT: 83 %
Neutro Abs: 5.2 10*3/uL (ref 1.7–7.7)
Neutro Abs: 5.2 10*3/uL (ref 1.7–7.7)
Neutrophils Relative %: 84 %
PLATELETS: 50 10*3/uL — AB (ref 150–400)
Platelets: 45 10*3/uL — ABNORMAL LOW (ref 150–400)
RBC: 4.23 MIL/uL (ref 4.22–5.81)
RBC: 4.11 MIL/uL — AB (ref 4.22–5.81)
RDW: 15.7 % — ABNORMAL HIGH (ref 11.5–15.5)
RDW: 15.6 % — ABNORMAL HIGH (ref 11.5–15.5)
WBC: 6.3 10*3/uL (ref 4.0–10.5)
WBC: 6.3 10*3/uL (ref 4.0–10.5)

## 2018-05-03 LAB — BODY FLUID CELL COUNT WITH DIFFERENTIAL
Lymphs, Fluid: 32 %
MONOCYTE-MACROPHAGE-SEROUS FLUID: 60 % (ref 50–90)
NEUTROPHIL FLUID: 8 % (ref 0–25)
WBC FLUID: 215 uL (ref 0–1000)

## 2018-05-03 LAB — LIPASE, BLOOD: LIPASE: 26 U/L (ref 11–51)

## 2018-05-03 LAB — COMPREHENSIVE METABOLIC PANEL
ALK PHOS: 175 U/L — AB (ref 38–126)
ALT: 17 U/L (ref 17–63)
ANION GAP: 8 (ref 5–15)
AST: 31 U/L (ref 15–41)
Albumin: 3.9 g/dL (ref 3.5–5.0)
BUN: 19 mg/dL (ref 6–20)
CALCIUM: 10.1 mg/dL (ref 8.9–10.3)
CHLORIDE: 101 mmol/L (ref 101–111)
CO2: 29 mmol/L (ref 22–32)
CREATININE: 0.94 mg/dL (ref 0.61–1.24)
Glucose, Bld: 120 mg/dL — ABNORMAL HIGH (ref 65–99)
Potassium: 4.5 mmol/L (ref 3.5–5.1)
Sodium: 138 mmol/L (ref 135–145)
Total Bilirubin: 2.2 mg/dL — ABNORMAL HIGH (ref 0.3–1.2)
Total Protein: 7.7 g/dL (ref 6.5–8.1)

## 2018-05-03 LAB — BASIC METABOLIC PANEL
ANION GAP: 11 (ref 5–15)
BUN: 19 mg/dL (ref 6–20)
CO2: 25 mmol/L (ref 22–32)
Calcium: 9.8 mg/dL (ref 8.9–10.3)
Chloride: 101 mmol/L (ref 101–111)
Creatinine, Ser: 0.86 mg/dL (ref 0.61–1.24)
GFR calc Af Amer: >60 mL/min (ref >60)
GLUCOSE: 117 mg/dL — AB (ref 65–99)
POTASSIUM: 4.2 mmol/L (ref 3.5–5.1)
SODIUM: 137 mmol/L (ref 135–145)

## 2018-05-03 LAB — HEPATIC FUNCTION PANEL
ALBUMIN: 3.7 g/dL (ref 3.5–5.0)
ALT: 16 U/L — ABNORMAL LOW (ref 17–63)
AST: 29 U/L (ref 15–41)
Alkaline Phosphatase: 169 U/L — ABNORMAL HIGH (ref 38–126)
Bilirubin, Direct: 0.6 mg/dL — ABNORMAL HIGH (ref 0.1–0.5)
Indirect Bilirubin: 1.6 mg/dL — ABNORMAL HIGH (ref 0.3–0.9)
TOTAL PROTEIN: 7.3 g/dL (ref 6.5–8.1)
Total Bilirubin: 2.2 mg/dL — ABNORMAL HIGH (ref 0.3–1.2)

## 2018-05-03 LAB — PROTEIN, PLEURAL OR PERITONEAL FLUID

## 2018-05-03 LAB — ALBUMIN, PLEURAL OR PERITONEAL FLUID: Albumin, Fluid: <1 g/dL

## 2018-05-03 LAB — GLUCOSE, PLEURAL OR PERITONEAL FLUID: Glucose, Fluid: 119 mg/dL

## 2018-05-03 LAB — TROPONIN I: Troponin I: <0.03 ng/mL (ref <0.03)

## 2018-05-03 LAB — LACTATE DEHYDROGENASE, PLEURAL OR PERITONEAL FLUID: LD, Fluid: 36 U/L — ABNORMAL HIGH (ref 3–23)

## 2018-05-03 MED ORDER — ACETAMINOPHEN 650 MG RE SUPP: 650.0000 mg | Freq: Four times a day (QID) | RECTAL | Status: DC | PRN

## 2018-05-03 MED ORDER — ONDANSETRON HCL 4 MG/2ML IJ SOLN
4.0000 mg | Freq: Four times a day (QID) | INTRAMUSCULAR | Status: DC | PRN
  Administered 2018-05-03 – 2018-05-07 (×2): 4 mg via INTRAVENOUS
  Filled 2018-05-03 (×2): qty 2

## 2018-05-03 MED ORDER — HYDRALAZINE HCL 20 MG/ML IJ SOLN
10.0000 mg | INTRAMUSCULAR | Status: DC | PRN
  Administered 2018-05-03 – 2018-05-07 (×5): 10 mg via INTRAVENOUS
  Filled 2018-05-03 (×5): qty 1

## 2018-05-03 MED ORDER — SERTRALINE HCL 100 MG PO TABS
100.0000 mg | ORAL_TABLET | Freq: Every day | ORAL | Status: DC
  Administered 2018-05-03 – 2018-05-11 (×9): 100 mg via ORAL
  Filled 2018-05-03 (×9): qty 1

## 2018-05-03 MED ORDER — SENNOSIDES-DOCUSATE SODIUM 8.6-50 MG PO TABS
1.0000 | ORAL_TABLET | Freq: Every day | ORAL | Status: DC
  Administered 2018-05-05 – 2018-05-10 (×6): 1 via ORAL
  Filled 2018-05-03 (×7): qty 1

## 2018-05-03 MED ORDER — ENSURE ENLIVE PO LIQD
237.0000 mL | Freq: Two times a day (BID) | ORAL | Status: DC
  Administered 2018-05-04 – 2018-05-11 (×7): 237 mL via ORAL

## 2018-05-03 MED ORDER — ACETAMINOPHEN 325 MG PO TABS: 650.0000 mg | ORAL_TABLET | Freq: Four times a day (QID) | ORAL | Status: DC | PRN

## 2018-05-03 MED ORDER — ALBUTEROL SULFATE (2.5 MG/3ML) 0.083% IN NEBU: 3.0000 mL | INHALATION_SOLUTION | Freq: Four times a day (QID) | RESPIRATORY_TRACT | Status: DC | PRN

## 2018-05-03 MED ORDER — IOPAMIDOL (ISOVUE-300) INJECTION 61%
INTRAVENOUS | Status: AC
  Filled 2018-05-03: qty 100

## 2018-05-03 MED ORDER — HYDROMORPHONE HCL 1 MG/ML IJ SOLN
1.0000 mg | Freq: Once | INTRAMUSCULAR | Status: AC
  Administered 2018-05-03: 1 mg via INTRAVENOUS
  Filled 2018-05-03: qty 1

## 2018-05-03 MED ORDER — LIDOCAINE HCL 1 % IJ SOLN
INTRAMUSCULAR | Status: AC
  Filled 2018-05-03: qty 20

## 2018-05-03 MED ORDER — ONDANSETRON HCL 4 MG PO TABS: 4.0000 mg | ORAL_TABLET | Freq: Four times a day (QID) | ORAL | Status: DC | PRN

## 2018-05-03 MED ORDER — SODIUM CHLORIDE 0.9 % IV SOLN
2.0000 g | INTRAVENOUS | Status: DC
  Administered 2018-05-03 – 2018-05-10 (×8): 2 g via INTRAVENOUS
  Filled 2018-05-03 (×8): qty 2

## 2018-05-03 MED ORDER — IOPAMIDOL (ISOVUE-300) INJECTION 61%
100.0000 mL | Freq: Once | INTRAVENOUS | Status: AC | PRN
  Administered 2018-05-03: 100 mL via INTRAVENOUS

## 2018-05-03 MED ORDER — HYDROMORPHONE HCL 1 MG/ML IJ SOLN
0.5000 mg | INTRAMUSCULAR | Status: DC | PRN
  Administered 2018-05-03 – 2018-05-04 (×8): 0.5 mg via INTRAVENOUS
  Filled 2018-05-03 (×8): qty 0.5

## 2018-05-03 NOTE — Procedures (Signed)
PROCEDURE SUMMARY:  Successful US guided paracentesis from RLQ.  Yielded 5.1 L of hazy dark yellow fluid.  No immediate complications.  Pt tolerated well.   Specimen was sent for labs.  Ascencion Dike PA-C 05/03/2018 12:53 PM

## 2018-05-03 NOTE — Progress Notes (Signed)
PROGRESS NOTE    KNUTE MAZZUCA  STM:[id number] DOB: 1956/01/29 DOA: 05/02/2018 PCP: [facility]   Brief Narrative:  HPI On 05/03/2018 by Dr. Gean Birchwood Adrian Compton is a 62 y.o. 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 Julien Nordmann oncologist and patient has appointment on June 3 presents to the ER because of worsening low back pain with increasing abdominal distention.  During the last month stay patient was advised about taking diuretics which patient declined.  Denies any nausea vomiting but has very poor appetite has been also having some chest pain which has been constant over the last few days with some shortness of breath mainly due to the abdominal distention and back pain.  Denies any incontinence of urine or bowel.  Assessment & Plan   Patient admitted earlier today by Dr. Hal Hope. See H&P for full details.   Back pain secondary to L3 compression fracture -Patient recently was diagnosed with metastatic adenocarcinoma was referred to oncology. -CT abdomen pelvis showed multiple lucent lesions present throughout the visible spine increased in size from 03/29/2018 compatible with osseous metastasis.  New L3 superior endplate pathological fracture without loss of vertebral body height. -continue pain control -Pending MRI of spine  Abdominal distention secondary to ascites with history of cirrhosis and hepatitis C -Patient placed empirically on ceftriaxone -CT abdomen pelvis showed cirrhotic liver with stigmata of severe portal hypertension.  Splenomegaly, approximately 2330cc. Moderate volume of ascites.  -Ultrasound-guided paracentesis pending -Patient has refused diuretics in the past  Chronic anemia and  thrombocytopenia -Secondary to cirrhosis -Continue to monitor CBC  Elevated blood pressure, no history of HTN -suspect secondary to pain -Continue hydralazine PRN  Metastatic adenocarcinoma -CT abdomen pelvis also noted liver lesions, lung base pulmonary nodules and abdominal lymphadenopathy -supposed to see Dr. Julien Nordmann  DVT Prophylaxis  SCDs  Code Status: DNR  Family Communication: None at bedside  Disposition Plan: Admitted.  Pending paracentesis.  Consultants Oncology, via phone by admitting physician  Procedures  None  Antibiotics   Anti-infectives (From admission, onward)   Start     Dose/Rate Route Frequency Ordered Stop   05/03/18 0600  cefTRIAXone (ROCEPHIN) 2 g in sodium chloride 0.9 % 100 mL IVPB     2 g 200 mL/hr over 30 Minutes Intravenous Every 24 hours 05/03/18 0455        Subjective:   Adrian Compton seen and examined today.  States he is not feeling well.  Complains of back pain.  Denies current chest pain, shortness of breath, nausea or vomiting, headache or dizziness.  Objective:   Vitals:   05/02/18 2300 05/03/18 0030 05/03/18 0230 05/03/18 0437  BP: (!) 153/105 (!) 162/83 (!) 161/96 (!) 174/93  Pulse: 78 78 85 88  Resp:  16 17 20   Temp:    98.4 F (36.9 C)  TempSrc:    Oral  SpO2: 94% 96% 98% 96%  Weight:      Height:        Intake/Output Summary (Last 24 hours) at 05/03/2018 1158 Last data filed at 05/03/2018 1054 Gross per 24 hour  Intake 100 ml  Output -  Net 100 ml   Filed Weights   05/02/18 2109  Weight: 98.9 kg (218 lb)    Exam  General:  Well developed, well nourished, NAD, appears stated age  12: NCAT, mucous membranes moist. Scleral icterus  Neck: Supple  Cardiovascular: S1 S2 auscultated, RRR, no murmur  Respiratory: Clear to auscultation bilaterally  Abdomen: Soft, nontender, distended, + bowel sounds  Extremities: warm dry without cyanosis clubbing or edema  Neuro: AAOx3, nonfocal  Psych: Appropriate  mood and affect   Data Reviewed: I have personally reviewed following labs and imaging studies  CBC: Recent Labs  Lab 05/02/18 2224 05/03/18 0604  WBC 6.3 6.3  NEUTROABS 5.2 5.2  HGB 12.6* 12.6*  HCT 36.8* 36.2*  MCV 87.0 88.1  PLT 50* 45*   Basic Metabolic Panel: Recent Labs  Lab 05/02/18 2224 05/03/18 0604  NA 138 137  K 4.5 4.2  CL 101 101  CO2 29 25  GLUCOSE 120* 117*  BUN 19 19  CREATININE 0.94 0.86  CALCIUM 10.1 9.8   GFR: Estimated Creatinine Clearance: 107.8 mL/min (by C-G formula based on SCr of 0.86 mg/dL). Liver Function Tests: Recent Labs  Lab 05/02/18 2224 05/03/18 0604  AST 31 29  ALT 17 16*  ALKPHOS 175* 169*  BILITOT 2.2* 2.2*  PROT 7.7 7.3  ALBUMIN 3.9 3.7   Recent Labs  Lab 05/02/18 2224  LIPASE 26   No results for input(s): AMMONIA in the last 168 hours. Coagulation Profile: No results for input(s): INR, PROTIME in the last 168 hours. Cardiac Enzymes: Recent Labs  Lab 05/03/18 0604  TROPONINI <0.03   BNP (last 3 results) No results for input(s): PROBNP in the last 8760 hours. HbA1C: No results for input(s): HGBA1C in the last 72 hours. CBG: No results for input(s): GLUCAP in the last 168 hours. Lipid Profile: No results for input(s): CHOL, HDL, LDLCALC, TRIG, CHOLHDL, LDLDIRECT in the last 72 hours. Thyroid Function Tests: No results for input(s): TSH, T4TOTAL, FREET4, T3FREE, THYROIDAB in the last 72 hours. Anemia Panel: No results for input(s): VITAMINB12, FOLATE, FERRITIN, TIBC, IRON, RETICCTPCT in the last 72 hours. Urine analysis:    Component Value Date/Time   COLORURINE AMBER (A) 05/24/2015 1239   APPEARANCEUR CLEAR 05/24/2015 1239   LABSPEC 1.015 05/24/2015 1239   PHURINE 5.5 05/24/2015 1239   GLUCOSEU NEGATIVE 05/24/2015 1239   HGBUR NEGATIVE 05/24/2015 1239   BILIRUBINUR NEGATIVE 05/24/2015 1239   KETONESUR NEGATIVE 05/24/2015 1239   PROTEINUR NEGATIVE 05/24/2015 1239   UROBILINOGEN 1.0 05/24/2015 1239    NITRITE NEGATIVE 05/24/2015 1239   LEUKOCYTESUR NEGATIVE 05/24/2015 1239   Sepsis Labs: @LABRCNTIP (procalcitonin:4,lacticidven:4)  )No results found for this or any previous visit (from the past 240 hour(s)).    Radiology Studies: Dg Chest 2 View  Result Date: 05/03/2018 CLINICAL DATA:  Acute onset of shortness of breath and upper abdominal pain. EXAM: CHEST - 2 VIEW COMPARISON:  Chest radiograph and CTA of the chest performed 03/28/2018; PET/CT performed 04/14/2018 FINDINGS: The lungs are well-aerated. Minimal left basilar atelectasis is noted. The known malignancy at the left lung base is not well characterized on radiograph. There is no evidence of pleural effusion or pneumothorax. The heart is normal in size; the mediastinal contour is within normal limits. No acute osseous abnormalities are seen. IMPRESSION: Minimal left basilar atelectasis noted. The known malignancy at the left lung base is not well characterized on radiograph. Electronically Signed   By: Garald Balding M.D.   On: 05/03/2018 00:00   Mr Cervical Spine Wo Contrast  Result Date: 05/03/2018 CLINICAL DATA:  Metastatic adenocarcinoma. EXAM: MRI CERVICAL SPINE WITHOUT CONTRAST TECHNIQUE: Multiplanar, multisequence  MR imaging of the cervical spine was performed. No intravenous contrast was administered. COMPARISON:  Fraser Din 04/14/2018 FINDINGS: Alignment: Normal alignment.  Straightening of the cervical lordosis Image quality degraded by motion. The patient not able tolerate postcontrast imaging. MRI thoracic and lumbar spine could not be performed at this time, the patient could not tolerate further imaging Vertebrae: Negative for fracture. 12 mm hyperintensity in the T1 pedicle on the left. This is best seen on axial gradient images. This was hypermetabolic on PET and is most likely due to metastatic disease. The lesion is difficult to see on sagittal images. There may be some extension into the vertebral body on the left on sagittal  inversion recovery. Hemangioma C7 vertebral body. Cord: Normal signal and morphology Posterior Fossa, vertebral arteries, paraspinal tissues: Negative Disc levels: C2-3: Negative C3-4: Mild disc and facet degeneration.  Mild spinal stenosis C4-5: Mild disc degeneration with diffuse spurring. Mild facet degeneration. Moderate spinal stenosis with cord flattening. Moderate foraminal stenosis bilaterally. C5-6: Moderate disc degeneration and spurring, left greater than right. Cord flattening with moderate spinal stenosis. Moderate left foraminal encroachment. C6-7: Disc degeneration and spondylosis. Cord flattening with moderate spinal stenosis and moderate foraminal stenosis bilaterally C7-T1: Negative for stenosis IMPRESSION: Image quality degraded by motion. The patient was not able to tolerate intravenous contrast for further imaging of the thoracic or lumbar spine. 12 mm lesion left C7 pedicle which was hypermetabolic on PET and is most consistent with bony metastatic disease. No extension into the canal. No fracture Multilevel spondylosis and spinal stenosis as described above. Electronically Signed   By: Franchot Gallo M.D.   On: 05/03/2018 11:39   Ct Abdomen Pelvis W Contrast  Result Date: 05/03/2018 CLINICAL DATA:  62 y/o M; central abdominal pain radiating to the left side. History of stage IV liver cancer and cirrhosis. EXAM: CT ABDOMEN AND PELVIS WITH CONTRAST CT LUMBAR SPINE WITHOUT CONTRAST TECHNIQUE: Multidetector CT imaging of the abdomen and pelvis was performed using the standard protocol following bolus administration of intravenous contrast. Multidetector CT imaging of the lumbar spine was performed without intravenous contrast administration. Multiplanar CT image reconstructions were also generated. CONTRAST:  151mL ISOVUE-300 IOPAMIDOL (ISOVUE-300) INJECTION 61% COMPARISON:  04/14/2018 PET-CT.  03/30/2018 CT abdomen and pelvis. FINDINGS: CT ABDOMEN AND PELVIS Lower chest: Multiple stable  pulmonary nodules in the lung bases the largest measuring 16 mm within the medial left lower lobe (series 4, image 21). Hepatobiliary: Cirrhotic liver. Ill-defined hypoattenuating central liver mass measuring up to 7.1 cm, stable from prior PET-CT given differences in technique. Additional subcentimeter nodules are present within liver segments 3, 6, 7, and 8 (series 2 image 9, 11, 14, 18, 24, 27). Tips catheter in situ. Severe enlargement of the portal venous system with lower esophageal, gastrohepatic, splenic, and mesenteric collaterals. Pancreas: Unremarkable. No pancreatic ductal dilatation or surrounding inflammatory changes. Spleen: Spleen measures 19.7 x 13.2 x 17.1 cm (volume = 2330 cm^3). Adrenals/Urinary Tract: Small caliber right kidney. Normal appearance of the left kidney. No hydronephrosis. Normal bladder. Stomach/Bowel: Stomach is within normal limits. Appendix appears normal. No evidence of bowel wall thickening, distention, or inflammatory changes. Vascular/Lymphatic: Aortic atherosclerosis. Mediastinal, portal, para-aortic lymphadenopathy is similar to prior PET-CT where it is better characterize. Reproductive: Prostatic calcification. Other: Moderate volume of ascites is increased from 04/14/2018. Musculoskeletal: As below. CT LUMBAR SPINE Segmentation: 5 lumbar type vertebrae. Alignment: Normal. Vertebrae: Multiple lucent lesions are present throughout the visible spine which are increased in size from 03/29/2018 likely representing osseous metastasis. There  is a superior endplate fracture associated with the L3 vertebral body without significant loss of vertebral body height. Paraspinal and other soft tissues: As above. Disc levels: Mild multilevel discogenic degenerative changes and moderate lower lumbar facet arthrosis. Multifactorial high-grade canal stenosis at the L4-5 level. Disc and facet degenerative changes results in mild-to-moderate bilateral foraminal stenosis at L4-5 and L5-S1.  IMPRESSION: 1. Multiple lucent lesions are present throughout the visible spine increased in size from 03/29/2018 compatible with osseous metastasis. New L3 superior endplate pathologic fracture without loss of vertebral body height. 2. Liver lesions, lung base pulmonary nodules, and abdominal lymphadenopathy is stable from prior PET-CT where they are better characterized. 3. Cirrhotic liver with stigmata of severe portal hypertension. Splenomegaly, approximately 2330 cc. 4. Moderate volume of ascites is increased from 04/14/2018. Electronically Signed   By: Kristine Garbe M.D.   On: 05/03/2018 01:40   Ct L-spine No Charge  Result Date: 05/03/2018 CLINICAL DATA:  62 y/o M; central abdominal pain radiating to the left side. History of stage IV liver cancer and cirrhosis. EXAM: CT ABDOMEN AND PELVIS WITH CONTRAST CT LUMBAR SPINE WITHOUT CONTRAST TECHNIQUE: Multidetector CT imaging of the abdomen and pelvis was performed using the standard protocol following bolus administration of intravenous contrast. Multidetector CT imaging of the lumbar spine was performed without intravenous contrast administration. Multiplanar CT image reconstructions were also generated. CONTRAST:  129mL ISOVUE-300 IOPAMIDOL (ISOVUE-300) INJECTION 61% COMPARISON:  04/14/2018 PET-CT.  03/30/2018 CT abdomen and pelvis. FINDINGS: CT ABDOMEN AND PELVIS Lower chest: Multiple stable pulmonary nodules in the lung bases the largest measuring 16 mm within the medial left lower lobe (series 4, image 21). Hepatobiliary: Cirrhotic liver. Ill-defined hypoattenuating central liver mass measuring up to 7.1 cm, stable from prior PET-CT given differences in technique. Additional subcentimeter nodules are present within liver segments 3, 6, 7, and 8 (series 2 image 9, 11, 14, 18, 24, 27). Tips catheter in situ. Severe enlargement of the portal venous system with lower esophageal, gastrohepatic, splenic, and mesenteric collaterals. Pancreas:  Unremarkable. No pancreatic ductal dilatation or surrounding inflammatory changes. Spleen: Spleen measures 19.7 x 13.2 x 17.1 cm (volume = 2330 cm^3). Adrenals/Urinary Tract: Small caliber right kidney. Normal appearance of the left kidney. No hydronephrosis. Normal bladder. Stomach/Bowel: Stomach is within normal limits. Appendix appears normal. No evidence of bowel wall thickening, distention, or inflammatory changes. Vascular/Lymphatic: Aortic atherosclerosis. Mediastinal, portal, para-aortic lymphadenopathy is similar to prior PET-CT where it is better characterize. Reproductive: Prostatic calcification. Other: Moderate volume of ascites is increased from 04/14/2018. Musculoskeletal: As below. CT LUMBAR SPINE Segmentation: 5 lumbar type vertebrae. Alignment: Normal. Vertebrae: Multiple lucent lesions are present throughout the visible spine which are increased in size from 03/29/2018 likely representing osseous metastasis. There is a superior endplate fracture associated with the L3 vertebral body without significant loss of vertebral body height. Paraspinal and other soft tissues: As above. Disc levels: Mild multilevel discogenic degenerative changes and moderate lower lumbar facet arthrosis. Multifactorial high-grade canal stenosis at the L4-5 level. Disc and facet degenerative changes results in mild-to-moderate bilateral foraminal stenosis at L4-5 and L5-S1. IMPRESSION: 1. Multiple lucent lesions are present throughout the visible spine increased in size from 03/29/2018 compatible with osseous metastasis. New L3 superior endplate pathologic fracture without loss of vertebral body height. 2. Liver lesions, lung base pulmonary nodules, and abdominal lymphadenopathy is stable from prior PET-CT where they are better characterized. 3. Cirrhotic liver with stigmata of severe portal hypertension. Splenomegaly, approximately 2330 cc. 4. Moderate volume of ascites is  increased from 04/14/2018. Electronically Signed    By: Kristine Garbe M.D.   On: 05/03/2018 01:40     Scheduled Meds: . feeding supplement (ENSURE ENLIVE)  237 mL Oral BID BM  . lidocaine      . senna-docusate  1 tablet Oral QHS  . sertraline  100 mg Oral Daily   Continuous Infusions: . cefTRIAXone (ROCEPHIN)  IV Stopped (05/03/18 0731)     LOS: 0 days   Time Spent in minutes   45 minutes  Wajiha Versteeg D.O. on 05/03/2018 at 11:58 AM  Between 7am to 7pm - Pager - [phone]  After 7pm go to [email or web address] - password [email or web address]  And look for the night coverage person covering for me after hours  [facility] Office  [phone]

## 2018-05-03 NOTE — H&P (Signed)
History and Physical    Adrian Compton YPP:[id number] DOB: [birth year] DOA: 05/02/2018  PCP: [facility]  Patient coming from: Home.  Chief Complaint: Back pain and increasing abdominal distention.  HPI: Adrian Compton is a 62 y.o. 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 Julien Nordmann oncologist and patient has appointment on June 3 presents to the ER because of worsening low back pain with increasing abdominal distention.  During the last month stay patient was advised about taking diuretics which patient declined.  Denies any nausea vomiting but has very poor appetite has been also having some chest pain which has been constant over the last few days with some shortness of breath mainly due to the abdominal distention and back pain.  Denies any incontinence of urine or bowel.  ED Course: In the ER on exam patient has distended abdomen nontender with patient complaining of persistent low back pain.  Patient had CT of the abdomen and pelvis and CT lumbar spine which shows new compression fracture of the L3 with multiple metastatic lesions in the spine.  Also has moderate ascites with portal hypertension.  On-call oncologist Dr. Rozetta Nunnery was consulted and advised to get MRI of the spine and reconsult Dr. Julien Nordmann in a.m.  Review of Systems: As per HPI, rest all negative.   Past Medical History:  Diagnosis Date  . Chronic low back pain   . Cirrhosis of liver ([place])   . Cirrhosis of liver due to hepatitis C   . Hepatitis C   . Presence of right artificial knee joint     Past Surgical History:  Procedure Laterality Date  . BACK SURGERY    . IR GENERIC HISTORICAL  03/02/2017   IR PARACENTESIS 03/02/2017 [name], PA  [facility]  . IR PARACENTESIS  04/01/2017  . IR PARACENTESIS  05/10/2017  . IR PARACENTESIS  06/06/2017  . IR PARACENTESIS  07/18/2017  . IR PARACENTESIS  08/12/2017  . IR PARACENTESIS  09/22/2017  . IR PARACENTESIS  11/04/2017  . IR PARACENTESIS  01/05/2018  . IR PARACENTESIS  02/10/2018  . IR PARACENTESIS  02/23/2018  . IR PARACENTESIS  03/16/2018  . IR PARACENTESIS  03/29/2018  . IR PARACENTESIS  04/19/2018  . KNEE ARTHROPLASTY    . LAMINECTOMY       reports that he quit smoking about 13 years ago. He quit smokeless tobacco use about 13 years ago. His smokeless tobacco use included snuff. He reports that he does not drink alcohol or use drugs.  Allergies  Allergen Reactions  . Nuvigil [Armodafinil] Anaphylaxis and Hives  . Codeine Nausea And Vomiting  . Lactose Intolerance (Gi) Other (See Comments)    unspecified    Family History  Problem Relation Age of Onset  . Breast cancer Mother   . Diabetes type II Mother   . Cancer Father   . Multiple sclerosis Father   . Skin cancer Brother     Prior to Admission medications   Medication Sig Start Date End Date Taking? Authorizing Provider  albuterol (PROVENTIL HFA;VENTOLIN HFA) 108 (90 BASE) MCG/ACT inhaler Inhale 2 puffs into the lungs every 6 (six) hours as needed for wheezing.   Yes [provider]  Lidocaine 4 % PTCH Apply 1 patch  topically daily as needed (pain).   Yes [provider]  ondansetron (ZOFRAN) 4 MG tablet Take 1 tablet (4 mg total) by mouth every 6 (six) hours. Patient taking differently: Take 4 mg by mouth every 6 (six) hours as needed.  06/06/14  Yes [name], [name], PA-C  Oxycodone HCl 10 MG TABS Take 10 mg by mouth every 8 (eight) hours.    Yes [provider]  sertraline (ZOLOFT) 100 MG tablet Take 100 mg by mouth daily.   Yes [provider]  phytonadione (VITAMIN K) 5 MG tablet Take one 5mg  tablet daily 3 days prior to procedure. Patient not taking: Reported on 05/02/2018 04/13/18    Rigoberto Noel, MD    Physical Exam: Vitals:   05/02/18 2300 05/03/18 0030 05/03/18 0230 05/03/18 0437  BP: (!) 153/105 (!) 162/83 (!) 161/96 (!) 174/93  Pulse: 78 78 85 88  Resp:  16 17 20   Temp:    98.4 F (36.9 C)  TempSrc:    Oral  SpO2: 94% 96% 98% 96%  Weight:      Height:          Constitutional: Moderately built and nourished. Vitals:   05/02/18 2300 05/03/18 0030 05/03/18 0230 05/03/18 0437  BP: (!) 153/105 (!) 162/83 (!) 161/96 (!) 174/93  Pulse: 78 78 85 88  Resp:  16 17 20   Temp:    98.4 F (36.9 C)  TempSrc:    Oral  SpO2: 94% 96% 98% 96%  Weight:      Height:       Eyes: Mild icterus no pallor. ENMT: No discharge from the ears eyes nose or mouth. Neck: No mass felt.  No neck rigidity. Respiratory: No rhonchi or crepitations. Cardiovascular: S1-S2 heard no murmurs appreciated. Abdomen: Distended nontender bowel sounds present. Musculoskeletal: No edema.  No joint effusion. Skin: No rash. Neurologic: Alert awake oriented to time place and person.  Moves all extremities 5 x 5. Psychiatric: Appears normal.  Normal affect.   Labs on Admission: I have personally reviewed following labs and imaging studies  CBC: Recent Labs  Lab 05/02/18 2224  WBC 6.3  NEUTROABS 5.2  HGB 12.6*  HCT 36.8*  MCV 87.0  PLT 50*   Basic Metabolic Panel: Recent Labs  Lab 05/02/18 2224  NA 138  K 4.5  CL 101  CO2 29  GLUCOSE 120*  BUN 19  CREATININE 0.94  CALCIUM 10.1   GFR: Estimated Creatinine Clearance: 98.6 mL/min (by C-G formula based on SCr of 0.94 mg/dL). Liver Function Tests: Recent Labs  Lab 05/02/18 2224  AST 31  ALT 17  ALKPHOS 175*  BILITOT 2.2*  PROT 7.7  ALBUMIN 3.9   Recent Labs  Lab 05/02/18 2224  LIPASE 26   No results for input(s): AMMONIA in the last 168 hours. Coagulation Profile: No results for input(s): INR, PROTIME in the last 168 hours. Cardiac Enzymes: No results for input(s): CKTOTAL, CKMB, CKMBINDEX, TROPONINI in  the last 168 hours. BNP (last 3 results) No results for input(s): PROBNP in the last 8760 hours. HbA1C: No results for input(s): HGBA1C in the last 72 hours. CBG: No results for input(s): GLUCAP in the last 168 hours. Lipid Profile: No results for input(s): CHOL, HDL, LDLCALC, TRIG, CHOLHDL, LDLDIRECT in the last 72 hours. Thyroid Function Tests: No results for input(s): TSH, T4TOTAL, FREET4, T3FREE, THYROIDAB in the last 72 hours. Anemia Panel: No results for input(s): VITAMINB12, FOLATE, FERRITIN, TIBC, IRON, RETICCTPCT in the last 72 hours.  Urine analysis:    Component Value Date/Time   COLORURINE AMBER (A) 05/24/2015 1239   APPEARANCEUR CLEAR 05/24/2015 1239   LABSPEC 1.015 05/24/2015 1239   PHURINE 5.5 05/24/2015 1239   GLUCOSEU NEGATIVE 05/24/2015 1239   HGBUR NEGATIVE 05/24/2015 1239   BILIRUBINUR NEGATIVE 05/24/2015 1239   KETONESUR NEGATIVE 05/24/2015 1239   PROTEINUR NEGATIVE 05/24/2015 1239   UROBILINOGEN 1.0 05/24/2015 1239   NITRITE NEGATIVE 05/24/2015 1239   LEUKOCYTESUR NEGATIVE 05/24/2015 1239   Sepsis Labs: @LABRCNTIP (procalcitonin:4,lacticidven:4) )No results found for this or any previous visit (from the past 240 hour(s)).   Radiological Exams on Admission: Dg Chest 2 View  Result Date: 05/03/2018 CLINICAL DATA:  Acute onset of shortness of breath and upper abdominal pain. EXAM: CHEST - 2 VIEW COMPARISON:  Chest radiograph and CTA of the chest performed 03/28/2018; PET/CT performed 04/14/2018 FINDINGS: The lungs are well-aerated. Minimal left basilar atelectasis is noted. The known malignancy at the left lung base is not well characterized on radiograph. There is no evidence of pleural effusion or pneumothorax. The heart is normal in size; the mediastinal contour is within normal limits. No acute osseous abnormalities are seen. IMPRESSION: Minimal left basilar atelectasis noted. The known malignancy at the left lung base is not well characterized on radiograph.  Electronically Signed   By: Garald Balding M.D.   On: 05/03/2018 00:00   Ct Abdomen Pelvis W Contrast  Result Date: 05/03/2018 CLINICAL DATA:  62 y/o M; central abdominal pain radiating to the left side. History of stage IV liver cancer and cirrhosis. EXAM: CT ABDOMEN AND PELVIS WITH CONTRAST CT LUMBAR SPINE WITHOUT CONTRAST TECHNIQUE: Multidetector CT imaging of the abdomen and pelvis was performed using the standard protocol following bolus administration of intravenous contrast. Multidetector CT imaging of the lumbar spine was performed without intravenous contrast administration. Multiplanar CT image reconstructions were also generated. CONTRAST:  161mL ISOVUE-300 IOPAMIDOL (ISOVUE-300) INJECTION 61% COMPARISON:  04/14/2018 PET-CT.  03/30/2018 CT abdomen and pelvis. FINDINGS: CT ABDOMEN AND PELVIS Lower chest: Multiple stable pulmonary nodules in the lung bases the largest measuring 16 mm within the medial left lower lobe (series 4, image 21). Hepatobiliary: Cirrhotic liver. Ill-defined hypoattenuating central liver mass measuring up to 7.1 cm, stable from prior PET-CT given differences in technique. Additional subcentimeter nodules are present within liver segments 3, 6, 7, and 8 (series 2 image 9, 11, 14, 18, 24, 27). Tips catheter in situ. Severe enlargement of the portal venous system with lower esophageal, gastrohepatic, splenic, and mesenteric collaterals. Pancreas: Unremarkable. No pancreatic ductal dilatation or surrounding inflammatory changes. Spleen: Spleen measures 19.7 x 13.2 x 17.1 cm (volume = 2330 cm^3). Adrenals/Urinary Tract: Small caliber right kidney. Normal appearance of the left kidney. No hydronephrosis. Normal bladder. Stomach/Bowel: Stomach is within normal limits. Appendix appears normal. No evidence of bowel wall thickening, distention, or inflammatory changes. Vascular/Lymphatic: Aortic atherosclerosis. Mediastinal, portal, para-aortic lymphadenopathy is similar to prior PET-CT  where it is better characterize. Reproductive: Prostatic calcification. Other: Moderate volume of ascites is increased from 04/14/2018. Musculoskeletal: As below. CT LUMBAR SPINE Segmentation: 5 lumbar type vertebrae. Alignment: Normal. Vertebrae: Multiple lucent lesions are present throughout the visible spine which are increased in size from 03/29/2018 likely representing osseous metastasis. There is a superior endplate fracture associated with the L3 vertebral body without significant loss of vertebral body height. Paraspinal and other soft tissues: As above. Disc levels: Mild multilevel discogenic degenerative changes and moderate lower lumbar facet arthrosis. Multifactorial high-grade canal stenosis at the L4-5 level. Disc  and facet degenerative changes results in mild-to-moderate bilateral foraminal stenosis at L4-5 and L5-S1. IMPRESSION: 1. Multiple lucent lesions are present throughout the visible spine increased in size from 03/29/2018 compatible with osseous metastasis. New L3 superior endplate pathologic fracture without loss of vertebral body height. 2. Liver lesions, lung base pulmonary nodules, and abdominal lymphadenopathy is stable from prior PET-CT where they are better characterized. 3. Cirrhotic liver with stigmata of severe portal hypertension. Splenomegaly, approximately 2330 cc. 4. Moderate volume of ascites is increased from 04/14/2018. Electronically Signed   By: Kristine Garbe M.D.   On: 05/03/2018 01:40   Ct L-spine No Charge  Result Date: 05/03/2018 CLINICAL DATA:  62 y/o M; central abdominal pain radiating to the left side. History of stage IV liver cancer and cirrhosis. EXAM: CT ABDOMEN AND PELVIS WITH CONTRAST CT LUMBAR SPINE WITHOUT CONTRAST TECHNIQUE: Multidetector CT imaging of the abdomen and pelvis was performed using the standard protocol following bolus administration of intravenous contrast. Multidetector CT imaging of the lumbar spine was performed without  intravenous contrast administration. Multiplanar CT image reconstructions were also generated. CONTRAST:  186mL ISOVUE-300 IOPAMIDOL (ISOVUE-300) INJECTION 61% COMPARISON:  04/14/2018 PET-CT.  03/30/2018 CT abdomen and pelvis. FINDINGS: CT ABDOMEN AND PELVIS Lower chest: Multiple stable pulmonary nodules in the lung bases the largest measuring 16 mm within the medial left lower lobe (series 4, image 21). Hepatobiliary: Cirrhotic liver. Ill-defined hypoattenuating central liver mass measuring up to 7.1 cm, stable from prior PET-CT given differences in technique. Additional subcentimeter nodules are present within liver segments 3, 6, 7, and 8 (series 2 image 9, 11, 14, 18, 24, 27). Tips catheter in situ. Severe enlargement of the portal venous system with lower esophageal, gastrohepatic, splenic, and mesenteric collaterals. Pancreas: Unremarkable. No pancreatic ductal dilatation or surrounding inflammatory changes. Spleen: Spleen measures 19.7 x 13.2 x 17.1 cm (volume = 2330 cm^3). Adrenals/Urinary Tract: Small caliber right kidney. Normal appearance of the left kidney. No hydronephrosis. Normal bladder. Stomach/Bowel: Stomach is within normal limits. Appendix appears normal. No evidence of bowel wall thickening, distention, or inflammatory changes. Vascular/Lymphatic: Aortic atherosclerosis. Mediastinal, portal, para-aortic lymphadenopathy is similar to prior PET-CT where it is better characterize. Reproductive: Prostatic calcification. Other: Moderate volume of ascites is increased from 04/14/2018. Musculoskeletal: As below. CT LUMBAR SPINE Segmentation: 5 lumbar type vertebrae. Alignment: Normal. Vertebrae: Multiple lucent lesions are present throughout the visible spine which are increased in size from 03/29/2018 likely representing osseous metastasis. There is a superior endplate fracture associated with the L3 vertebral body without significant loss of vertebral body height. Paraspinal and other soft tissues:  As above. Disc levels: Mild multilevel discogenic degenerative changes and moderate lower lumbar facet arthrosis. Multifactorial high-grade canal stenosis at the L4-5 level. Disc and facet degenerative changes results in mild-to-moderate bilateral foraminal stenosis at L4-5 and L5-S1. IMPRESSION: 1. Multiple lucent lesions are present throughout the visible spine increased in size from 03/29/2018 compatible with osseous metastasis. New L3 superior endplate pathologic fracture without loss of vertebral body height. 2. Liver lesions, lung base pulmonary nodules, and abdominal lymphadenopathy is stable from prior PET-CT where they are better characterized. 3. Cirrhotic liver with stigmata of severe portal hypertension. Splenomegaly, approximately 2330 cc. 4. Moderate volume of ascites is increased from 04/14/2018. Electronically Signed   By: Kristine Garbe M.D.   On: 05/03/2018 01:40     Assessment/Plan Principal Problem:   Closed compression fracture of L3 lumbar vertebra, initial encounter [facility]) Active Problems:   Hepatic cirrhosis due to chronic hepatitis C  infection ([place])   Ascites   Thrombocytopenia, secondary due to cirrhosis   Low back pain   Elevated blood pressure reading   Pathological compression fracture of spine ([place])    1. L3 compression fracture appears to be pathology called with multiple spinal lesions and recent biopsy showing metastatic adenocarcinoma-on-call oncologist Dr. Rozetta Nunnery advised to get MRI of the spine which I have ordered.  Patient is on pain relief medications.  Please consult Dr. Julien Nordmann oncologist in a.m. 2. Ascites with known history of cirrhosis of liver with hepatitis C -patient empirically placed on ceftriaxone we will get ultrasound-guided paracentesis.  Check labs.  Patient has previously refused diuretics. 3. Chronic anemia and thrombocytopenia likely from cirrhosis -follow CBC. 4. Elevated blood pressure -we will keep patient on PRN IV hydralazine.   Follow blood pressure trends.   DVT prophylaxis: SCDs. Code Status: DNR. Family Communication: No family at the bedside. Disposition Plan: Home. Consults called: None. Admission status: Inpatient.   [name] MD [facility] Pager [phone].  If 7PM-7AM, please contact night-coverage [email or web address] Password [email or web address]  05/03/2018, 4:55 AM

## 2018-05-03 NOTE — Progress Notes (Signed)
PHARMACY NOTE:  ANTIMICROBIAL RENAL DOSAGE ADJUSTMENT  Current antimicrobial regimen includes a mismatch between antimicrobial dosage and indication.  As per policy approved by the Pharmacy & Therapeutics and Medical Executive Committees, the antimicrobial dosage will be adjusted accordingly.  Current antimicrobial dosage:  Rocephin 1 Gm IV q24h  Indication: SBP coverage  Renal Function:  Estimated Creatinine Clearance: 98.6 mL/min (by C-G formula based on SCr of 0.94 mg/dL). []      On intermittent HD, scheduled: []      On CRRT    Antimicrobial dosage has been changed to:  Rocephin 2 gm IV q24h     Thank you for allowing pharmacy to be a part of this patient's care.  Dorrene German, [facility] 05/03/2018 5:01 AM

## 2018-05-03 NOTE — ED Notes (Signed)
ED TO INPATIENT HANDOFF REPORT  Name/Age/Gender Adrian Compton 62 y.o. male  Code Status Code Status History    Date Active Date Inactive Code Status Order ID Comments User Context   03/28/2018 1800 03/30/2018 1728 DNR [id number]  Samella Parr, NP ED   05/24/2015 1614 05/26/2015 1513 Full Code [id number]  Domenic Polite, MD ED   01/24/2013 0158 01/30/2013 1819 Full Code [id number]  Adrian Patience, MD Inpatient    Questions for Most Recent Historical Code Status (Order [id number])    Question Answer Comment   In the event of cardiac or respiratory ARREST Do not call a "code blue"    In the event of cardiac or respiratory ARREST Do not perform Intubation, CPR, defibrillation or ACLS    In the event of cardiac or respiratory ARREST Use medication by any route, position, wound care, and other measures to relive pain and suffering. May use oxygen, suction and manual treatment of airway obstruction as needed for comfort.       Home/SNF/Other Home  Chief Complaint cancer pt pains in abdominal aea and back area  Level of Care/Admitting Diagnosis ED Disposition    ED Disposition Condition [address]: [place] [[id number]]  Level of Care: Telemetry [5]  Admit to tele based on following criteria: Monitor for Ischemic changes  Diagnosis: Low back pain [825053]  Admitting Physician: Adrian Compton [phone]  Attending Physician: Adrian Compton [phone]  Estimated length of stay: past midnight tomorrow  Certification:: I certify this patient will need inpatient services for at least 2 midnights  PT Class (Do Not Modify): Inpatient [101]  PT Acc Code (Do Not Modify): Private [1]       Medical History Past Medical History:  Diagnosis Date  . Chronic low back pain   . Cirrhosis of liver ([place])   . Cirrhosis of liver due to hepatitis C   . Hepatitis C   . Presence of right artificial knee joint     Allergies Allergies  Allergen  Reactions  . Nuvigil [Armodafinil] Anaphylaxis and Hives  . Codeine Nausea And Vomiting  . Lactose Intolerance (Gi) Other (See Comments)    unspecified    IV Location/Drains/Wounds Patient Lines/Drains/Airways Status   Active Line/Drains/Airways    Name:   Placement date:   Placement time:   Site:   Days:   Peripheral IV 05/02/18 Right Antecubital   05/02/18    2342    Antecubital   1   Incision (Closed) 04/24/18 Buttocks Right   04/24/18    1209     9          Labs/Imaging Results for orders placed or performed during the hospital encounter of 05/02/18 (from the past 48 hour(s))  CBC with Differential/Platelet     Status: Abnormal   Collection Time: 05/02/18 10:24 PM  Result Value Ref Range   WBC 6.3 4.0 - 10.5 K/uL   RBC 4.23 4.22 - 5.81 MIL/uL   Hemoglobin 12.6 (L) 13.0 - 17.0 g/dL   HCT 36.8 (L) 39.0 - 52.0 %   MCV 87.0 78.0 - 100.0 fL   MCH 29.8 26.0 - 34.0 pg   MCHC 34.2 30.0 - 36.0 g/dL   RDW 15.7 (H) 11.5 - 15.5 %   Platelets 50 (L) 150 - 400 K/uL    Comment: REPEATED TO VERIFY SPECIMEN CHECKED FOR CLOTS PLATELET COUNT CONFIRMED BY SMEAR    Neutrophils Relative % 84 %  Neutro Abs 5.2 1.7 - 7.7 K/uL   Lymphocytes Relative 7 %   Lymphs Abs 0.4 (L) 0.7 - 4.0 K/uL   Monocytes Relative 7 %   Monocytes Absolute 0.5 0.1 - 1.0 K/uL   Eosinophils Relative 2 %   Eosinophils Absolute 0.2 0.0 - 0.7 K/uL   Basophils Relative 0 %   Basophils Absolute 0.0 0.0 - 0.1 K/uL    Comment: Performed at [facility], [place] [address] [place], [place] [address]  Comprehensive metabolic panel     Status: Abnormal   Collection Time: 05/02/18 10:24 PM  Result Value Ref Range   Sodium 138 135 - 145 mmol/L   Potassium 4.5 3.5 - 5.1 mmol/L   Chloride 101 101 - 111 mmol/L   CO2 29 22 - 32 mmol/L   Glucose, Bld 120 (H) 65 - 99 mg/dL   BUN 19 6 - 20 mg/dL   Creatinine, Ser 0.94 0.61 - 1.24 mg/dL   Calcium 10.1 8.9 - 10.3 mg/dL   Total Protein 7.7 6.5 - 8.1 g/dL    Albumin 3.9 3.5 - 5.0 g/dL   AST 31 15 - 41 U/L   ALT 17 17 - 63 U/L   Alkaline Phosphatase 175 (H) 38 - 126 U/L   Total Bilirubin 2.2 (H) 0.3 - 1.2 mg/dL   GFR calc non Af Amer >60 >60 mL/min   GFR calc Af Amer >60 >60 mL/min    Comment: (NOTE) The eGFR has been calculated using the CKD EPI equation. This calculation has not been validated in all clinical situations. eGFR's persistently <60 mL/min signify possible Chronic Kidney Disease.    Anion gap 8 5 - 15    Comment: Performed at [facility], [place] [address], [place], [place] [address]  Lipase, blood     Status: None   Collection Time: 05/02/18 10:24 PM  Result Value Ref Range   Lipase 26 11 - 51 U/L    Comment: Performed at [facility], [place] [address], [place], [place] [address]  I-Stat Troponin, ED (not at [facility])     Status: None   Collection Time: 05/02/18 11:40 PM  Result Value Ref Range   Troponin i, poc 0.01 0.00 - 0.08 ng/mL   Comment 3            Comment: Due to the release kinetics of cTnI, a negative result within the first hours of the onset of symptoms does not rule out myocardial infarction with certainty. If myocardial infarction is still suspected, repeat the test at appropriate intervals.    Dg Chest 2 View  Result Date: 05/03/2018 CLINICAL DATA:  Acute onset of shortness of breath and upper abdominal pain. EXAM: CHEST - 2 VIEW COMPARISON:  Chest radiograph and CTA of the chest performed 03/28/2018; PET/CT performed 04/14/2018 FINDINGS: The lungs are well-aerated. Minimal left basilar atelectasis is noted. The known malignancy at the left lung base is not well characterized on radiograph. There is no evidence of pleural effusion or pneumothorax. The heart is normal in size; the mediastinal contour is within normal limits. No acute osseous abnormalities are seen. IMPRESSION: Minimal left basilar atelectasis noted. The known malignancy at the left lung base is not well  characterized on radiograph. Electronically Signed   By: Garald Balding M.D.   On: 05/03/2018 00:00   Ct Abdomen Pelvis W Contrast  Result Date: 05/03/2018 CLINICAL DATA:  62 y/o M; central abdominal pain radiating to the left side. History  of stage IV liver cancer and cirrhosis. EXAM: CT ABDOMEN AND PELVIS WITH CONTRAST CT LUMBAR SPINE WITHOUT CONTRAST TECHNIQUE: Multidetector CT imaging of the abdomen and pelvis was performed using the standard protocol following bolus administration of intravenous contrast. Multidetector CT imaging of the lumbar spine was performed without intravenous contrast administration. Multiplanar CT image reconstructions were also generated. CONTRAST:  181m ISOVUE-300 IOPAMIDOL (ISOVUE-300) INJECTION 61% COMPARISON:  04/14/2018 PET-CT.  03/30/2018 CT abdomen and pelvis. FINDINGS: CT ABDOMEN AND PELVIS Lower chest: Multiple stable pulmonary nodules in the lung bases the largest measuring 16 mm within the medial left lower lobe (series 4, image 21). Hepatobiliary: Cirrhotic liver. Ill-defined hypoattenuating central liver mass measuring up to 7.1 cm, stable from prior PET-CT given differences in technique. Additional subcentimeter nodules are present within liver segments 3, 6, 7, and 8 (series 2 image 9, 11, 14, 18, 24, 27). Tips catheter in situ. Severe enlargement of the portal venous system with lower esophageal, gastrohepatic, splenic, and mesenteric collaterals. Pancreas: Unremarkable. No pancreatic ductal dilatation or surrounding inflammatory changes. Spleen: Spleen measures 19.7 x 13.2 x 17.1 cm (volume = 2330 cm^3). Adrenals/Urinary Tract: Small caliber right kidney. Normal appearance of the left kidney. No hydronephrosis. Normal bladder. Stomach/Bowel: Stomach is within normal limits. Appendix appears normal. No evidence of bowel wall thickening, distention, or inflammatory changes. Vascular/Lymphatic: Aortic atherosclerosis. Mediastinal, portal, para-aortic lymphadenopathy  is similar to prior PET-CT where it is better characterize. Reproductive: Prostatic calcification. Other: Moderate volume of ascites is increased from 04/14/2018. Musculoskeletal: As below. CT LUMBAR SPINE Segmentation: 5 lumbar type vertebrae. Alignment: Normal. Vertebrae: Multiple lucent lesions are present throughout the visible spine which are increased in size from 03/29/2018 likely representing osseous metastasis. There is a superior endplate fracture associated with the L3 vertebral body without significant loss of vertebral body height. Paraspinal and other soft tissues: As above. Disc levels: Mild multilevel discogenic degenerative changes and moderate lower lumbar facet arthrosis. Multifactorial high-grade canal stenosis at the L4-5 level. Disc and facet degenerative changes results in mild-to-moderate bilateral foraminal stenosis at L4-5 and L5-S1. IMPRESSION: 1. Multiple lucent lesions are present throughout the visible spine increased in size from 03/29/2018 compatible with osseous metastasis. New L3 superior endplate pathologic fracture without loss of vertebral body height. 2. Liver lesions, lung base pulmonary nodules, and abdominal lymphadenopathy is stable from prior PET-CT where they are better characterized. 3. Cirrhotic liver with stigmata of severe portal hypertension. Splenomegaly, approximately 2330 cc. 4. Moderate volume of ascites is increased from 04/14/2018. Electronically Signed   By: LKristine GarbeM.D.   On: 05/03/2018 01:40   Ct L-spine No Charge  Result Date: 05/03/2018 CLINICAL DATA:  62y/o M; central abdominal pain radiating to the left side. History of stage IV liver cancer and cirrhosis. EXAM: CT ABDOMEN AND PELVIS WITH CONTRAST CT LUMBAR SPINE WITHOUT CONTRAST TECHNIQUE: Multidetector CT imaging of the abdomen and pelvis was performed using the standard protocol following bolus administration of intravenous contrast. Multidetector CT imaging of the lumbar spine  was performed without intravenous contrast administration. Multiplanar CT image reconstructions were also generated. CONTRAST:  1032mISOVUE-300 IOPAMIDOL (ISOVUE-300) INJECTION 61% COMPARISON:  04/14/2018 PET-CT.  03/30/2018 CT abdomen and pelvis. FINDINGS: CT ABDOMEN AND PELVIS Lower chest: Multiple stable pulmonary nodules in the lung bases the largest measuring 16 mm within the medial left lower lobe (series 4, image 21). Hepatobiliary: Cirrhotic liver. Ill-defined hypoattenuating central liver mass measuring up to 7.1 cm, stable from prior PET-CT given differences in technique. Additional subcentimeter nodules  are present within liver segments 3, 6, 7, and 8 (series 2 image 9, 11, 14, 18, 24, 27). Tips catheter in situ. Severe enlargement of the portal venous system with lower esophageal, gastrohepatic, splenic, and mesenteric collaterals. Pancreas: Unremarkable. No pancreatic ductal dilatation or surrounding inflammatory changes. Spleen: Spleen measures 19.7 x 13.2 x 17.1 cm (volume = 2330 cm^3). Adrenals/Urinary Tract: Small caliber right kidney. Normal appearance of the left kidney. No hydronephrosis. Normal bladder. Stomach/Bowel: Stomach is within normal limits. Appendix appears normal. No evidence of bowel wall thickening, distention, or inflammatory changes. Vascular/Lymphatic: Aortic atherosclerosis. Mediastinal, portal, para-aortic lymphadenopathy is similar to prior PET-CT where it is better characterize. Reproductive: Prostatic calcification. Other: Moderate volume of ascites is increased from 04/14/2018. Musculoskeletal: As below. CT LUMBAR SPINE Segmentation: 5 lumbar type vertebrae. Alignment: Normal. Vertebrae: Multiple lucent lesions are present throughout the visible spine which are increased in size from 03/29/2018 likely representing osseous metastasis. There is a superior endplate fracture associated with the L3 vertebral body without significant loss of vertebral body height. Paraspinal and  other soft tissues: As above. Disc levels: Mild multilevel discogenic degenerative changes and moderate lower lumbar facet arthrosis. Multifactorial high-grade canal stenosis at the L4-5 level. Disc and facet degenerative changes results in mild-to-moderate bilateral foraminal stenosis at L4-5 and L5-S1. IMPRESSION: 1. Multiple lucent lesions are present throughout the visible spine increased in size from 03/29/2018 compatible with osseous metastasis. New L3 superior endplate pathologic fracture without loss of vertebral body height. 2. Liver lesions, lung base pulmonary nodules, and abdominal lymphadenopathy is stable from prior PET-CT where they are better characterized. 3. Cirrhotic liver with stigmata of severe portal hypertension. Splenomegaly, approximately 2330 cc. 4. Moderate volume of ascites is increased from 04/14/2018. Electronically Signed   By: Kristine Garbe M.D.   On: 05/03/2018 01:40    Pending Labs Unresulted Labs (From admission, onward)   None      Vitals/Pain Today's Vitals   05/02/18 2235 05/02/18 2300 05/03/18 0030 05/03/18 0230  BP: (!) 153/86 (!) 153/105 (!) 162/83 (!) 161/96  Pulse: 84 78 78 85  Resp: 16  16 17   Temp:      TempSrc:      SpO2: 97% 94% 96% 98%  Weight:      Height:      PainSc:        Isolation Precautions No active isolations  Medications Medications  HYDROmorphone (DILAUDID) injection 1 mg (1 mg Intravenous Given 05/02/18 2331)  ondansetron (ZOFRAN) injection 4 mg (4 mg Intravenous Given 05/02/18 2331)  iopamidol (ISOVUE-300) 61 % injection 100 mL (100 mLs Intravenous Contrast Given 05/03/18 0103)  HYDROmorphone (DILAUDID) injection 1 mg (1 mg Intravenous Given 05/03/18 0238)  HYDROmorphone (DILAUDID) injection 1 mg (1 mg Intravenous Given 05/03/18 0319)    Mobility walks with person assist

## 2018-05-04 ENCOUNTER — Inpatient Hospital Stay (HOSPITAL_COMMUNITY): Payer: Medicare Other

## 2018-05-04 ENCOUNTER — Ambulatory Visit
Admit: 2018-05-04 | Discharge: 2018-05-04 | Disposition: A | Discharge status: Home / Self Care | Payer: Medicare Other | Source: Ambulatory Visit | Attending: Radiation Oncology | Admitting: Radiation Oncology

## 2018-05-04 DIAGNOSIS — C781 Secondary malignant neoplasm of mediastinum: Secondary | ICD-10-CM

## 2018-05-04 DIAGNOSIS — C259 Malignant neoplasm of pancreas, unspecified: Secondary | ICD-10-CM

## 2018-05-04 DIAGNOSIS — Z87891 Personal history of nicotine dependence: Secondary | ICD-10-CM

## 2018-05-04 DIAGNOSIS — D6959 Other secondary thrombocytopenia: Secondary | ICD-10-CM

## 2018-05-04 DIAGNOSIS — C787 Secondary malignant neoplasm of liver and intrahepatic bile duct: Secondary | ICD-10-CM

## 2018-05-04 DIAGNOSIS — M8458XA Pathological fracture in neoplastic disease, other specified site, initial encounter for fracture: Secondary | ICD-10-CM

## 2018-05-04 DIAGNOSIS — Z888 Allergy status to other drugs, medicaments and biological substances status: Secondary | ICD-10-CM

## 2018-05-04 DIAGNOSIS — Z885 Allergy status to narcotic agent status: Secondary | ICD-10-CM

## 2018-05-04 DIAGNOSIS — C7951 Secondary malignant neoplasm of bone: Secondary | ICD-10-CM | POA: Insufficient documentation

## 2018-05-04 DIAGNOSIS — I517 Cardiomegaly: Secondary | ICD-10-CM

## 2018-05-04 DIAGNOSIS — Z91011 Allergy to milk products: Secondary | ICD-10-CM

## 2018-05-04 DIAGNOSIS — Z808 Family history of malignant neoplasm of other organs or systems: Secondary | ICD-10-CM

## 2018-05-04 DIAGNOSIS — Z803 Family history of malignant neoplasm of breast: Secondary | ICD-10-CM

## 2018-05-04 DIAGNOSIS — Z809 Family history of malignant neoplasm, unspecified: Secondary | ICD-10-CM

## 2018-05-04 DIAGNOSIS — B1920 Unspecified viral hepatitis C without hepatic coma: Secondary | ICD-10-CM

## 2018-05-04 LAB — GRAM STAIN

## 2018-05-04 LAB — BASIC METABOLIC PANEL
Anion gap: 10 (ref 5–15)
BUN: 25 mg/dL — ABNORMAL HIGH (ref 6–20)
CHLORIDE: 103 mmol/L (ref 101–111)
CO2: 25 mmol/L (ref 22–32)
Calcium: 9.9 mg/dL (ref 8.9–10.3)
Creatinine, Ser: 0.8 mg/dL (ref 0.61–1.24)
GFR calc non Af Amer: >60 mL/min (ref >60)
Glucose, Bld: 123 mg/dL — ABNORMAL HIGH (ref 65–99)
POTASSIUM: 3.8 mmol/L (ref 3.5–5.1)
SODIUM: 138 mmol/L (ref 135–145)

## 2018-05-04 LAB — CBC
HCT: 38.7 % — ABNORMAL LOW (ref 39.0–52.0)
HEMOGLOBIN: 13.3 g/dL (ref 13.0–17.0)
MCH: 29.9 pg (ref 26.0–34.0)
MCHC: 34.4 g/dL (ref 30.0–36.0)
MCV: 87 fL (ref 78.0–100.0)
Platelets: 51 10*3/uL — ABNORMAL LOW (ref 150–400)
RBC: 4.45 MIL/uL (ref 4.22–5.81)
RDW: 16 % — ABNORMAL HIGH (ref 11.5–15.5)
WBC: 9.4 10*3/uL (ref 4.0–10.5)

## 2018-05-04 LAB — ECHOCARDIOGRAM COMPLETE
HEIGHTINCHES: 75 in
WEIGHTICAEL: 3488 [oz_av]

## 2018-05-04 MED ORDER — HYDROMORPHONE HCL 1 MG/ML IJ SOLN
1.0000 mg | INTRAMUSCULAR | Status: DC | PRN
  Administered 2018-05-04 – 2018-05-05 (×9): 1 mg via INTRAVENOUS
  Filled 2018-05-04 (×9): qty 1

## 2018-05-04 MED ORDER — OXYCODONE HCL 5 MG PO TABS
5.0000 mg | ORAL_TABLET | ORAL | Status: DC | PRN
  Administered 2018-05-04 – 2018-05-05 (×5): 5 mg via ORAL
  Filled 2018-05-04 (×5): qty 1

## 2018-05-04 NOTE — Consult Note (Signed)
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 male is admitted to the hospital with complaints of abdominal pain.  He has long-standing history of hepatitis C with cirrhosis of the liver who was admitted last month with lymphadenopathy in the mediastinum there was suspicion for lung cancer and hence he underwent a PET CT scan as well as a biopsy of his iliac bone.  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  He is now admitted with mid to lower back pain intense in nature causing severe discomfort.  He does not have any symptoms or signs of cord compression.  He is still able to move his legs and has sensation as well. He tells me that he had long-standing history of hepatitis C for which he received treatment with interferon and ribavirin.  He tells me that he could not tolerate those treatments at all and nearly died from all of those treatments.  He has been on transplant list since he was able to clear the hepatitis C.  I reviewed her records extensively and collaborated the history with the patient.  MEDICAL HISTORY:  Past Medical History:  Diagnosis Date  . Chronic low back pain   . Cirrhosis of liver ([place])   . Cirrhosis of liver due to hepatitis C   . Hepatitis C   . Presence of right artificial knee joint     SURGICAL HISTORY: Past Surgical History:  Procedure Laterality Date  . BACK SURGERY    . IR GENERIC HISTORICAL  03/02/2017   IR PARACENTESIS 03/02/2017 [name], PA [facility]INTER[facility]  . IR PARACENTESIS  04/01/2017  . IR PARACENTESIS  05/10/2017  . IR PARACENTESIS  06/06/2017  . IR PARACENTESIS  07/18/2017  . IR PARACENTESIS  08/12/2017  . IR  PARACENTESIS  09/22/2017  . IR PARACENTESIS  11/04/2017  . IR PARACENTESIS  01/05/2018  . IR PARACENTESIS  02/10/2018  . IR PARACENTESIS  02/23/2018  . IR PARACENTESIS  03/16/2018  . IR PARACENTESIS  03/29/2018  . IR PARACENTESIS  04/19/2018  . KNEE ARTHROPLASTY    . LAMINECTOMY      SOCIAL HISTORY: Social History   Socioeconomic History  . Marital status: Married    Spouse name: Not on file  . Number of children: Not on file  . Years of education: Not on file  . Highest education level: Not on file  Occupational History  . Not on file  Social Needs  . Financial resource strain: Not on file  . Food insecurity:    Worry: Not on file    Inability: Not on file  . Transportation needs:    Medical: Not on file    Non-medical: Not on file  Tobacco Use  . Smoking status: Former Smoker    Last attempt to quit: 04/12/2005    Years since quitting: 13.0  . Smokeless tobacco: Former Systems developer    Types: Snuff    Quit date: 04/12/2005  Substance and Sexual Activity  . Alcohol use: No  . Drug use: No  . Sexual activity: Never  Lifestyle  . Physical activity:    Days per week: Not on file    Minutes per session: Not on file  . Stress: Not on file  Relationships  . Social connections:    Talks on phone: Not on file    Gets together: Not on file    Attends religious service: Not on file    Active member of club or organization: Not on file    Attends meetings of clubs or organizations: Not on file    Relationship status: Not on file  . Intimate partner violence:    Fear of current or ex partner: Not on file    Emotionally abused: Not on file    Physically abused: Not on file    Forced sexual activity: Not on file  Other Topics Concern  . Not on file  Social History Narrative  . Not on file    FAMILY HISTORY: Family History  Problem Relation Age of Onset  . Breast cancer Mother   . Diabetes type II Mother   . Cancer Father   . Multiple sclerosis Father   . Skin cancer Brother      ALLERGIES:  is allergic to nuvigil [armodafinil]; codeine; and lactose intolerance (gi).  MEDICATIONS:  Current Facility-Administered Medications  Medication Dose Route Frequency Provider Last Rate Last Dose  . acetaminophen (TYLENOL) tablet 650 mg  650 mg Oral Q6H PRN [name], MD       Or  . acetaminophen (TYLENOL) suppository 650 mg  650 mg Rectal Q6H PRN [name], MD      . albuterol (PROVENTIL) (2.5 MG/3ML) 0.083% nebulizer solution 3 mL  3 mL Inhalation Q6H PRN [name], MD      . cefTRIAXone (ROCEPHIN) 2 g in sodium chloride 0.9 % 100 mL IVPB  2 g Intravenous Q24H [name], MD 200 mL/hr at 05/04/18 0552 2 g at 05/04/18 0552  . feeding supplement (ENSURE ENLIVE) (ENSURE ENLIVE) liquid 237 mL  237 mL Oral BID BM [name], MD   237 mL at 05/04/18 1442  . hydrALAZINE (APRESOLINE) injection 10 mg  10 mg Intravenous Q4H PRN [name], MD   10 mg at 05/04/18 0552  . HYDROmorphone (DILAUDID) injection 1 mg  1 mg Intravenous Q3H PRN Schorr, Rhetta Mura, NP   1 mg at 05/04/18 1228  . ondansetron (ZOFRAN) tablet 4 mg  4 mg Oral Q6H PRN [name], MD       Or  . ondansetron [facility]) injection 4 mg  4 mg Intravenous Q6H PRN [name], MD   4 mg at 05/03/18 1959  . oxyCODONE (Oxy IR/ROXICODONE) immediate release tablet 5 mg  5 mg Oral Q4H PRN Cristal Ford, DO   5 mg at 05/04/18 1053  . senna-docusate (Senokot-S) tablet 1 tablet  1 tablet Oral QHS [name], MD      . sertraline (ZOLOFT) tablet 100 mg  100 mg Oral Daily [name], MD   100 mg at 05/04/18 2831    REVIEW OF SYSTEMS:   Constitutional: Denies fevers, chills or abnormal night sweats Eyes: Denies blurriness of vision, double vision or watery eyes Ears, nose, mouth, throat, and face: Denies mucositis or sore throat Respiratory: Denies cough, dyspnea or wheezes Cardiovascular: Denies palpitation, chest discomfort or  lower extremity swelling Gastrointestinal: Abdominal distention Skin: Denies abnormal skin rashes Lymphatics: Denies new lymphadenopathy or easy bruising Neurological: Severe back pain Behavioral/Psych: Mood is stable, no new changes  All other systems were reviewed with the patient and are negative.  PHYSICAL EXAMINATION: ECOG PERFORMANCE STATUS: 3 - Symptomatic, >50% confined to bed  Vitals:  05/04/18 0434 05/04/18 0628  BP: (!) 181/98 (!) 171/85  Pulse: 94 89  Resp: 18   Temp: 98.3 F (36.8 C)   SpO2: 92%    Filed Weights   05/02/18 2109  Weight: 218 lb (98.9 kg)    GENERAL:alert, no distress and comfortable SKIN: skin color, texture, turgor are normal, no rashes or significant lesions EYES: normal, conjunctiva are pink and non-injected, sclera clear OROPHARYNX:no exudate, no erythema and lips, buccal mucosa, and tongue normal  NECK: supple, thyroid normal size, non-tender, without nodularity LYMPH:  no palpable lymphadenopathy in the cervical, axillary or inguinal LUNGS: clear to auscultation and percussion with normal breathing effort HEART: regular rate & rhythm and no murmurs and no lower extremity edema ABDOMEN: Mild distention Musculoskeletal:no cyanosis of digits and no clubbing  PSYCH: alert & oriented x 3 with fluent speech NEURO: no focal motor/sensory deficits, back pain  LABORATORY DATA:  I have reviewed the data as listed Lab Results  Component Value Date   WBC 9.4 05/04/2018   HGB 13.3 05/04/2018   HCT 38.7 (L) 05/04/2018   MCV 87.0 05/04/2018   PLT 51 (L) 05/04/2018   Lab Results  Component Value Date   NA 138 05/04/2018   K 3.8 05/04/2018   CL 103 05/04/2018   CO2 25 05/04/2018    RADIOGRAPHIC STUDIES: I have personally reviewed the radiological reports and agreed with the findings in the report.  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  Bone metastases throughout the spine with a L3 compression fracture.  I discussed with him the results of the scans as well as the pathology report.   Pain issues: Palliative care will be consulted Radiation oncology has also been consulted to look at palliative radiation therapy options.  Prognosis: I discussed with him that metastatic pancreaticobiliary cancers have extremely poor prognosis with very short survival usually less than 8 weeks. We discussed treatment with sorafenib which can lead to prolongation of life.  However the side effects of sorafenib include diarrhea, hand-foot syndrome as well as blood count abnormalities.  With his hepatitis C and cirrhosis it may be more toxic as well.  Based on these discussions, no further systemic therapy will be pursued. Hospice care will need to be consulted upon discharge. Thank you very much for involving [place] In his care.   All questions were answered. The patient knows to call the clinic with any problems, questions or concerns.    Adrian Ohara, MD @T @

## 2018-05-04 NOTE — Progress Notes (Signed)
  Echocardiogram 2D Echocardiogram has been performed.  Bobbye Charleston 05/04/2018, 1:07 PM

## 2018-05-04 NOTE — Progress Notes (Signed)
Initial Nutrition Assessment  DOCUMENTATION CODES:   Non-severe (moderate) malnutrition in context of chronic illness  INTERVENTION:    Ensure Enlive po BID, each supplement provides 350 kcal and 20 grams of protein  Obtain actual weight reading  NUTRITION DIAGNOSIS:   Moderate Malnutrition related to chronic illness, cancer and cancer related treatments(Hep C/cirrhosis) as evidenced by energy intake < 75% for > or equal to 1 month, moderate muscle depletion, mild muscle depletion.  GOAL:   Patient will meet greater than or equal to 90% of their needs  MONITOR:   PO intake, Supplement acceptance, Weight trends, Labs  REASON FOR ASSESSMENT:   Malnutrition Screening Tool    ASSESSMENT:   Patient with PMH significant for hepatitis C with cirrhosis. Recently admitted last month and was found to have lymphadenopathy. 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 Presents this admission with L3 compression fracture and ascites.    Pt reports a drastic decline in appetite over the last 2-3 weeks due to ongoing feelings of fullness. Pt typically eats 2 meals/day but during this time period meals dropped to 1/day. Meals consist of mostly milkshakes from [employer] and smoothies from [employer]. Pt does not use supplementation at home. Discussed the importance of protein intake for preservation of lean body mass. Pt drank an Ensure for breakfast and would like to continue with these. He is willing to continue them at home after discharge.   Pt endorses a UBW of 220 lb. Records indicate pt weighed 214 lb 03/30/18 and show a stated weight of 218 lb this admission. Will need to obtain actual weight reading to determine actual weight. Fluid may be masking losses. Nutrition-Focused physical exam completed.   Medications reviewed and include: senokot Labs reviewed.   Diet Order:   Diet Order           Diet 2 gram sodium Room service appropriate? Yes; Fluid  consistency: Thin  Diet effective now          EDUCATION NEEDS:   Education needs have been addressed  Skin:  Skin Assessment: Skin Integrity Issues: Skin Integrity Issues:: Incisions Incisions: right buttocks  Last BM:  05/02/18  Height:   Ht Readings from Last 1 Encounters:  05/02/18 6\' 3"  (1.905 m)    Weight:   Wt Readings from Last 1 Encounters:  05/02/18 218 lb (98.9 kg)    Ideal Body Weight:  89.1 kg  BMI:  Body mass index is 27.25 kg/m.  Estimated Nutritional Needs:   Kcal:  2250-2450 kcal  Protein:  110-120 g  Fluid:  >2.2 L/day    Mariana Single RD, LDN Clinical Nutrition Pager # - [phone]

## 2018-05-04 NOTE — Progress Notes (Signed)
PROGRESS NOTE    Adrian Compton  ZOX:[id number] DOB: October 16, 1956 DOA: 05/02/2018 PCP: [facility]   Brief Narrative:  HPI On 05/03/2018 by Dr. Gean Birchwood Adrian Compton is a 62 y.o. 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 Julien Nordmann oncologist and patient has appointment on June 3 presents to the ER because of worsening low back pain with increasing abdominal distention.  During the last month stay patient was advised about taking diuretics which patient declined.  Denies any nausea vomiting but has very poor appetite has been also having some chest pain which has been constant over the last few days with some shortness of breath mainly due to the abdominal distention and back pain.  Denies any incontinence of urine or bowel.  Assessment & Plan   Back pain secondary to L3 compression fracture -Patient recently was diagnosed with metastatic adenocarcinoma was referred to oncology. -CT abdomen pelvis showed multiple lucent lesions present throughout the visible spine increased in size from 03/29/2018 compatible with osseous metastasis.  New L3 superior endplate pathological fracture without loss of vertebral body height. -continue pain control, Continue IV dilaudid and will add on oxycodone for better pain control -Only able to complete MRI cervical spine without contrast secondary to pain. Does not feel that he can complete the thoracic and lumber MRI today. -MRI cervical spine: 13mm lesion left C7 to call which was hyper metabolic on PET and most consistent with bony metastatic disease. -Discussed with Dr. Julien Nordmann, however, he is referring patient to more of an GI onc given that patient's primary is not lung.   Abdominal  distention secondary to ascites with history of cirrhosis and hepatitis C -Patient placed empirically on ceftriaxone -CT abdomen pelvis showed cirrhotic liver with stigmata of severe portal hypertension.  Splenomegaly, approximately 2330cc. Moderate volume of ascites.  -s/p Ultrasound-guided paracentesis yielding 5.1L  -Patient has refused diuretics in the past  Chronic anemia and thrombocytopenia -Secondary to cirrhosis -Continue to monitor CBC  Elevated blood pressure, no history of HTN -suspect secondary to pain -Continue hydralazine PRN  Metastatic adenocarcinoma -CT abdomen pelvis also noted liver lesions, lung base pulmonary nodules and abdominal lymphadenopathy -supposed to see Dr. Julien Nordmann- see discussion above   DVT Prophylaxis  SCDs  Code Status: DNR  Family Communication: None at bedside  Disposition Plan: Admitted.  Pending improvement in pain control and recommendations from oncology.  Consultants Oncology  Procedures  US guided paracentesis  Antibiotics   Anti-infectives (From admission, onward)   Start     Dose/Rate Route Frequency Ordered Stop   05/03/18 0600  cefTRIAXone (ROCEPHIN) 2 g in sodium chloride 0.9 % 100 mL IVPB     2 g 200 mL/hr over 30 Minutes Intravenous Every 24 hours 05/03/18 0455        Subjective:   Adrian Compton seen and examined today.  Feels mildly improved but continues to have pain especially with movement.  States he cannot find a comfortable position.  Continues to have back pain.  States he feels confused from time to time.  Denies current chest pain, shortness of breath, abdominal pain, nausea or vomiting, diarrhea or constipation.  Objective:   Vitals:   05/03/18 1454 05/03/18 1952 05/04/18 0434 05/04/18 0628  BP: (!) 149/86 (!) 177/95 Marland Kitchen)  181/98 (!) 171/85  Pulse: 88 (!) 107 94 89  Resp: 17 18 18    Temp: 98.4 F (36.9 C) 98.4 F (36.9 C) 98.3 F (36.8 C)   TempSrc: Oral Oral Oral   SpO2: 96% 93% 92%   Weight:        Height:        Intake/Output Summary (Last 24 hours) at 05/04/2018 1007 Last data filed at 05/04/2018 6294 Gross per 24 hour  Intake 440 ml  Output -  Net 440 ml   Filed Weights   05/02/18 2109  Weight: 98.9 kg (218 lb)   Exam  General: Well developed, well nourished, NAD, appears stated age  HEENT: NCAT, scleral icterus, mucous membranes moist.   Neck: Supple  Cardiovascular: S1 S2 auscultated, no rubs, murmurs or gallops. Regular rate and rhythm.  Respiratory: Clear to auscultation bilaterally with equal chest rise  Abdomen: Soft, nontender, nondistended, + bowel sounds  Extremities: warm dry without cyanosis clubbing or edema  Neuro: AAOx3, nonfocal  Psych: propria mood and affect  Data Reviewed: I have personally reviewed following labs and imaging studies  CBC: Recent Labs  Lab 05/02/18 2224 05/03/18 0604 05/04/18 0617  WBC 6.3 6.3 9.4  NEUTROABS 5.2 5.2  --   HGB 12.6* 12.6* 13.3  HCT 36.8* 36.2* 38.7*  MCV 87.0 88.1 87.0  PLT 50* 45* 51*   Basic Metabolic Panel: Recent Labs  Lab 05/02/18 2224 05/03/18 0604 05/04/18 0617  NA 138 137 138  K 4.5 4.2 3.8  CL 101 101 103  CO2 29 25 25   GLUCOSE 120* 117* 123*  BUN 19 19 25*  CREATININE 0.94 0.86 0.80  CALCIUM 10.1 9.8 9.9   GFR: Estimated Creatinine Clearance: 115.9 mL/min (by C-G formula based on SCr of 0.8 mg/dL). Liver Function Tests: Recent Labs  Lab 05/02/18 2224 05/03/18 0604  AST 31 29  ALT 17 16*  ALKPHOS 175* 169*  BILITOT 2.2* 2.2*  PROT 7.7 7.3  ALBUMIN 3.9 3.7   Recent Labs  Lab 05/02/18 2224  LIPASE 26   No results for input(s): AMMONIA in the last 168 hours. Coagulation Profile: No results for input(s): INR, PROTIME in the last 168 hours. Cardiac Enzymes: Recent Labs  Lab 05/03/18 0604 05/03/18 1356 05/03/18 1927  TROPONINI <0.03 <0.03 <0.03   BNP (last 3 results) No results for input(s): PROBNP in the last 8760 hours. HbA1C: No results for input(s):  HGBA1C in the last 72 hours. CBG: No results for input(s): GLUCAP in the last 168 hours. Lipid Profile: No results for input(s): CHOL, HDL, LDLCALC, TRIG, CHOLHDL, LDLDIRECT in the last 72 hours. Thyroid Function Tests: No results for input(s): TSH, T4TOTAL, FREET4, T3FREE, THYROIDAB in the last 72 hours. Anemia Panel: No results for input(s): VITAMINB12, FOLATE, FERRITIN, TIBC, IRON, RETICCTPCT in the last 72 hours. Urine analysis:    Component Value Date/Time   COLORURINE AMBER (A) 05/24/2015 1239   APPEARANCEUR CLEAR 05/24/2015 1239   LABSPEC 1.015 05/24/2015 1239   PHURINE 5.5 05/24/2015 1239   GLUCOSEU NEGATIVE 05/24/2015 1239   HGBUR NEGATIVE 05/24/2015 1239   BILIRUBINUR NEGATIVE 05/24/2015 1239   KETONESUR NEGATIVE 05/24/2015 1239   PROTEINUR NEGATIVE 05/24/2015 1239   UROBILINOGEN 1.0 05/24/2015 1239   NITRITE NEGATIVE 05/24/2015 1239   LEUKOCYTESUR NEGATIVE 05/24/2015 1239   Sepsis Labs: @LABRCNTIP (procalcitonin:4,lacticidven:4)  ) Recent Results (from the past 240 hour(s))  Gram stain     Status: None (Preliminary result)   Collection Time: 05/03/18 12:39 PM  Result  Value Ref Range Status   Specimen Description PERITONEAL  Final   Special Requests NONE  Final   Gram Stain   Final    RARE WBC PRESENT, PREDOMINANTLY MONONUCLEAR NO ORGANISMS SEEN Performed at [facility], [address], [place], [place] [address]    Report Status PENDING  Incomplete      Radiology Studies: Dg Chest 2 View  Result Date: 05/03/2018 CLINICAL DATA:  Acute onset of shortness of breath and upper abdominal pain. EXAM: CHEST - 2 VIEW COMPARISON:  Chest radiograph and CTA of the chest performed 03/28/2018; PET/CT performed 04/14/2018 FINDINGS: The lungs are well-aerated. Minimal left basilar atelectasis is noted. The known malignancy at the left lung base is not well characterized on radiograph. There is no evidence of pleural effusion or pneumothorax. The heart is normal in  size; the mediastinal contour is within normal limits. No acute osseous abnormalities are seen. IMPRESSION: Minimal left basilar atelectasis noted. The known malignancy at the left lung base is not well characterized on radiograph. Electronically Signed   By: Garald Balding M.D.   On: 05/03/2018 00:00   Mr Cervical Spine Wo Contrast  Result Date: 05/03/2018 CLINICAL DATA:  Metastatic adenocarcinoma. EXAM: MRI CERVICAL SPINE WITHOUT CONTRAST TECHNIQUE: Multiplanar, multisequence MR imaging of the cervical spine was performed. No intravenous contrast was administered. COMPARISON:  Fraser Din 04/14/2018 FINDINGS: Alignment: Normal alignment.  Straightening of the cervical lordosis Image quality degraded by motion. The patient not able tolerate postcontrast imaging. MRI thoracic and lumbar spine could not be performed at this time, the patient could not tolerate further imaging Vertebrae: Negative for fracture. 12 mm hyperintensity in the T1 pedicle on the left. This is best seen on axial gradient images. This was hypermetabolic on PET and is most likely due to metastatic disease. The lesion is difficult to see on sagittal images. There may be some extension into the vertebral body on the left on sagittal inversion recovery. Hemangioma C7 vertebral body. Cord: Normal signal and morphology Posterior Fossa, vertebral arteries, paraspinal tissues: Negative Disc levels: C2-3: Negative C3-4: Mild disc and facet degeneration.  Mild spinal stenosis C4-5: Mild disc degeneration with diffuse spurring. Mild facet degeneration. Moderate spinal stenosis with cord flattening. Moderate foraminal stenosis bilaterally. C5-6: Moderate disc degeneration and spurring, left greater than right. Cord flattening with moderate spinal stenosis. Moderate left foraminal encroachment. C6-7: Disc degeneration and spondylosis. Cord flattening with moderate spinal stenosis and moderate foraminal stenosis bilaterally C7-T1: Negative for stenosis  IMPRESSION: Image quality degraded by motion. The patient was not able to tolerate intravenous contrast for further imaging of the thoracic or lumbar spine. 12 mm lesion left C7 pedicle which was hypermetabolic on PET and is most consistent with bony metastatic disease. No extension into the canal. No fracture Multilevel spondylosis and spinal stenosis as described above. Electronically Signed   By: Franchot Gallo M.D.   On: 05/03/2018 11:39   Ct Abdomen Pelvis W Contrast  Result Date: 05/03/2018 CLINICAL DATA:  62 y/o M; central abdominal pain radiating to the left side. History of stage IV liver cancer and cirrhosis. EXAM: CT ABDOMEN AND PELVIS WITH CONTRAST CT LUMBAR SPINE WITHOUT CONTRAST TECHNIQUE: Multidetector CT imaging of the abdomen and pelvis was performed using the standard protocol following bolus administration of intravenous contrast. Multidetector CT imaging of the lumbar spine was performed without intravenous contrast administration. Multiplanar CT image reconstructions were also generated. CONTRAST:  129mL ISOVUE-300 IOPAMIDOL (ISOVUE-300) INJECTION 61% COMPARISON:  04/14/2018 PET-CT.  03/30/2018 CT abdomen  and pelvis. FINDINGS: CT ABDOMEN AND PELVIS Lower chest: Multiple stable pulmonary nodules in the lung bases the largest measuring 16 mm within the medial left lower lobe (series 4, image 21). Hepatobiliary: Cirrhotic liver. Ill-defined hypoattenuating central liver mass measuring up to 7.1 cm, stable from prior PET-CT given differences in technique. Additional subcentimeter nodules are present within liver segments 3, 6, 7, and 8 (series 2 image 9, 11, 14, 18, 24, 27). Tips catheter in situ. Severe enlargement of the portal venous system with lower esophageal, gastrohepatic, splenic, and mesenteric collaterals. Pancreas: Unremarkable. No pancreatic ductal dilatation or surrounding inflammatory changes. Spleen: Spleen measures 19.7 x 13.2 x 17.1 cm (volume = 2330 cm^3). Adrenals/Urinary  Tract: Small caliber right kidney. Normal appearance of the left kidney. No hydronephrosis. Normal bladder. Stomach/Bowel: Stomach is within normal limits. Appendix appears normal. No evidence of bowel wall thickening, distention, or inflammatory changes. Vascular/Lymphatic: Aortic atherosclerosis. Mediastinal, portal, para-aortic lymphadenopathy is similar to prior PET-CT where it is better characterize. Reproductive: Prostatic calcification. Other: Moderate volume of ascites is increased from 04/14/2018. Musculoskeletal: As below. CT LUMBAR SPINE Segmentation: 5 lumbar type vertebrae. Alignment: Normal. Vertebrae: Multiple lucent lesions are present throughout the visible spine which are increased in size from 03/29/2018 likely representing osseous metastasis. There is a superior endplate fracture associated with the L3 vertebral body without significant loss of vertebral body height. Paraspinal and other soft tissues: As above. Disc levels: Mild multilevel discogenic degenerative changes and moderate lower lumbar facet arthrosis. Multifactorial high-grade canal stenosis at the L4-5 level. Disc and facet degenerative changes results in mild-to-moderate bilateral foraminal stenosis at L4-5 and L5-S1. IMPRESSION: 1. Multiple lucent lesions are present throughout the visible spine increased in size from 03/29/2018 compatible with osseous metastasis. New L3 superior endplate pathologic fracture without loss of vertebral body height. 2. Liver lesions, lung base pulmonary nodules, and abdominal lymphadenopathy is stable from prior PET-CT where they are better characterized. 3. Cirrhotic liver with stigmata of severe portal hypertension. Splenomegaly, approximately 2330 cc. 4. Moderate volume of ascites is increased from 04/14/2018. Electronically Signed   By: Kristine Garbe M.D.   On: 05/03/2018 01:40   US Paracentesis  Result Date: 05/03/2018 INDICATION: History of cirrhosis and hepatitis C. Abdominal  distention secondary to recurrent ascites. Request for diagnostic and therapeutic PARACENTESIS. EXAM: ULTRASOUND GUIDED RIGHT LOWER QUADRANT PARACENTESIS MEDICATIONS: None. COMPLICATIONS: None immediate. PROCEDURE: Informed written consent was obtained from the patient after a discussion of the risks, benefits and alternatives to treatment. A timeout was performed prior to the initiation of the procedure. Initial ultrasound scanning demonstrates a large amount of ascites within the right lower abdominal quadrant. The right lower abdomen was prepped and draped in the usual sterile fashion. 1% lidocaine with epinephrine was used for local anesthesia. Following this, a 19 gauge, 7-cm, Yueh catheter was introduced. An ultrasound image was saved for documentation purposes. The paracentesis was performed. The catheter was removed and a dressing was applied. The patient tolerated the procedure well without immediate post procedural complication. FINDINGS: A total of approximately 5.1 L of hazy, dark yellow fluid was removed. Samples were sent to the laboratory as requested by the clinical team. IMPRESSION: Successful ultrasound-guided paracentesis yielding 5.1 liters of peritoneal fluid. Read by: Ascencion Dike PA-C Electronically Signed   By: Sandi Mariscal M.D.   On: 05/03/2018 12:57   Ct L-spine No Charge  Result Date: 05/03/2018 CLINICAL DATA:  63 y/o M; central abdominal pain radiating to the left side. History of stage IV liver  cancer and cirrhosis. EXAM: CT ABDOMEN AND PELVIS WITH CONTRAST CT LUMBAR SPINE WITHOUT CONTRAST TECHNIQUE: Multidetector CT imaging of the abdomen and pelvis was performed using the standard protocol following bolus administration of intravenous contrast. Multidetector CT imaging of the lumbar spine was performed without intravenous contrast administration. Multiplanar CT image reconstructions were also generated. CONTRAST:  175mL ISOVUE-300 IOPAMIDOL (ISOVUE-300) INJECTION 61% COMPARISON:   04/14/2018 PET-CT.  03/30/2018 CT abdomen and pelvis. FINDINGS: CT ABDOMEN AND PELVIS Lower chest: Multiple stable pulmonary nodules in the lung bases the largest measuring 16 mm within the medial left lower lobe (series 4, image 21). Hepatobiliary: Cirrhotic liver. Ill-defined hypoattenuating central liver mass measuring up to 7.1 cm, stable from prior PET-CT given differences in technique. Additional subcentimeter nodules are present within liver segments 3, 6, 7, and 8 (series 2 image 9, 11, 14, 18, 24, 27). Tips catheter in situ. Severe enlargement of the portal venous system with lower esophageal, gastrohepatic, splenic, and mesenteric collaterals. Pancreas: Unremarkable. No pancreatic ductal dilatation or surrounding inflammatory changes. Spleen: Spleen measures 19.7 x 13.2 x 17.1 cm (volume = 2330 cm^3). Adrenals/Urinary Tract: Small caliber right kidney. Normal appearance of the left kidney. No hydronephrosis. Normal bladder. Stomach/Bowel: Stomach is within normal limits. Appendix appears normal. No evidence of bowel wall thickening, distention, or inflammatory changes. Vascular/Lymphatic: Aortic atherosclerosis. Mediastinal, portal, para-aortic lymphadenopathy is similar to prior PET-CT where it is better characterize. Reproductive: Prostatic calcification. Other: Moderate volume of ascites is increased from 04/14/2018. Musculoskeletal: As below. CT LUMBAR SPINE Segmentation: 5 lumbar type vertebrae. Alignment: Normal. Vertebrae: Multiple lucent lesions are present throughout the visible spine which are increased in size from 03/29/2018 likely representing osseous metastasis. There is a superior endplate fracture associated with the L3 vertebral body without significant loss of vertebral body height. Paraspinal and other soft tissues: As above. Disc levels: Mild multilevel discogenic degenerative changes and moderate lower lumbar facet arthrosis. Multifactorial high-grade canal stenosis at the L4-5 level.  Disc and facet degenerative changes results in mild-to-moderate bilateral foraminal stenosis at L4-5 and L5-S1. IMPRESSION: 1. Multiple lucent lesions are present throughout the visible spine increased in size from 03/29/2018 compatible with osseous metastasis. New L3 superior endplate pathologic fracture without loss of vertebral body height. 2. Liver lesions, lung base pulmonary nodules, and abdominal lymphadenopathy is stable from prior PET-CT where they are better characterized. 3. Cirrhotic liver with stigmata of severe portal hypertension. Splenomegaly, approximately 2330 cc. 4. Moderate volume of ascites is increased from 04/14/2018. Electronically Signed   By: Kristine Garbe M.D.   On: 05/03/2018 01:40     Scheduled Meds: . feeding supplement (ENSURE ENLIVE)  237 mL Oral BID BM  . senna-docusate  1 tablet Oral QHS  . sertraline  100 mg Oral Daily   Continuous Infusions: . cefTRIAXone (ROCEPHIN)  IV 2 g (05/04/18 0552)     LOS: 1 day   Time Spent in minutes   45 minutes  Rashaunda Rahl D.O. on 05/04/2018 at 10:07 AM  Between 7am to 7pm - Pager - [phone]  After 7pm go to [email or web address] - password [email or web address]  And look for the night coverage person covering for me after hours  [facility] Office  [phone]

## 2018-05-05 ENCOUNTER — Ambulatory Visit
Admit: 2018-05-05 | Discharge: 2018-05-05 | Disposition: A | Discharge status: Home / Self Care | Payer: Medicare Other | Attending: Radiation Oncology | Admitting: Radiation Oncology

## 2018-05-05 DIAGNOSIS — C7951 Secondary malignant neoplasm of bone: Secondary | ICD-10-CM

## 2018-05-05 DIAGNOSIS — E440 Moderate protein-calorie malnutrition: Secondary | ICD-10-CM

## 2018-05-05 LAB — CBC
HCT: 38.8 % — ABNORMAL LOW (ref 39.0–52.0)
Hemoglobin: 13.5 g/dL (ref 13.0–17.0)
MCH: 30.5 pg (ref 26.0–34.0)
MCHC: 34.8 g/dL (ref 30.0–36.0)
MCV: 87.6 fL (ref 78.0–100.0)
PLATELETS: 47 10*3/uL — AB (ref 150–400)
RBC: 4.43 MIL/uL (ref 4.22–5.81)
RDW: 16.1 % — ABNORMAL HIGH (ref 11.5–15.5)
WBC: 8.9 10*3/uL (ref 4.0–10.5)

## 2018-05-05 LAB — BASIC METABOLIC PANEL
ANION GAP: 7 (ref 5–15)
BUN: 25 mg/dL — ABNORMAL HIGH (ref 6–20)
CALCIUM: 9.9 mg/dL (ref 8.9–10.3)
CO2: 31 mmol/L (ref 22–32)
Chloride: 99 mmol/L — ABNORMAL LOW (ref 101–111)
Creatinine, Ser: 0.91 mg/dL (ref 0.61–1.24)
Glucose, Bld: 112 mg/dL — ABNORMAL HIGH (ref 65–99)
Potassium: 4.1 mmol/L (ref 3.5–5.1)
Sodium: 137 mmol/L (ref 135–145)

## 2018-05-05 MED ORDER — HYDROMORPHONE 1 MG/ML IV SOLN
INTRAVENOUS | Status: DC
  Administered 2018-05-05: 18:00:00 via INTRAVENOUS
  Administered 2018-05-06: 4 mg via INTRAVENOUS
  Filled 2018-05-05: qty 25

## 2018-05-05 MED ORDER — DIPHENHYDRAMINE HCL 50 MG/ML IJ SOLN: 12.5000 mg | Freq: Four times a day (QID) | INTRAMUSCULAR | Status: DC | PRN

## 2018-05-05 MED ORDER — OXYCODONE HCL 5 MG PO TABS
15.0000 mg | ORAL_TABLET | ORAL | Status: DC | PRN
  Administered 2018-05-05 (×2): 15 mg via ORAL
  Filled 2018-05-05 (×2): qty 3

## 2018-05-05 MED ORDER — HYDROMORPHONE HCL 1 MG/ML IJ SOLN
1.0000 mg | INTRAMUSCULAR | Status: DC
  Filled 2018-05-05: qty 1

## 2018-05-05 MED ORDER — FAMOTIDINE 20 MG PO TABS
20.0000 mg | ORAL_TABLET | Freq: Two times a day (BID) | ORAL | Status: DC
  Administered 2018-05-05 – 2018-05-11 (×13): 20 mg via ORAL
  Filled 2018-05-05 (×13): qty 1

## 2018-05-05 MED ORDER — NALOXONE HCL 0.4 MG/ML IJ SOLN: 0.4000 mg | INTRAMUSCULAR | Status: DC | PRN

## 2018-05-05 MED ORDER — HYDROMORPHONE HCL 1 MG/ML IJ SOLN
2.0000 mg | Freq: Once | INTRAMUSCULAR | Status: AC
  Administered 2018-05-05: 2 mg via INTRAVENOUS

## 2018-05-05 MED ORDER — MORPHINE SULFATE (PF) 2 MG/ML IV SOLN
2.0000 mg | Freq: Once | INTRAVENOUS | Status: AC
  Administered 2018-05-05: 2 mg via INTRAVENOUS
  Filled 2018-05-05: qty 1

## 2018-05-05 MED ORDER — GI COCKTAIL ~~LOC~~
30.0000 mL | Freq: Three times a day (TID) | ORAL | Status: DC | PRN
  Administered 2018-05-05 – 2018-05-06 (×2): 30 mL via ORAL
  Filled 2018-05-05 (×2): qty 30

## 2018-05-05 MED ORDER — LORAZEPAM 2 MG/ML IJ SOLN
0.5000 mg | Freq: Three times a day (TID) | INTRAMUSCULAR | Status: DC
  Administered 2018-05-05 – 2018-05-11 (×17): 0.5 mg via INTRAVENOUS
  Filled 2018-05-05 (×18): qty 1

## 2018-05-05 MED ORDER — HYDROMORPHONE HCL 1 MG/ML IJ SOLN
INTRAMUSCULAR | Status: AC
  Filled 2018-05-05: qty 2

## 2018-05-05 MED ORDER — SODIUM CHLORIDE 0.9% FLUSH: 9.0000 mL | INTRAVENOUS | Status: DC | PRN

## 2018-05-05 MED ORDER — DIPHENHYDRAMINE HCL 12.5 MG/5ML PO ELIX: 12.5000 mg | ORAL_SOLUTION | Freq: Four times a day (QID) | ORAL | Status: DC | PRN

## 2018-05-05 MED ORDER — HYDROMORPHONE HCL 1 MG/ML IJ SOLN
1.0000 mg | INTRAMUSCULAR | Status: DC | PRN
  Administered 2018-05-05 (×3): 1 mg via INTRAVENOUS
  Filled 2018-05-05 (×3): qty 1

## 2018-05-05 MED ORDER — LORAZEPAM 0.5 MG PO TABS
0.5000 mg | ORAL_TABLET | Freq: Four times a day (QID) | ORAL | Status: DC | PRN
  Administered 2018-05-05: 0.5 mg via ORAL
  Filled 2018-05-05 (×2): qty 1

## 2018-05-05 MED ORDER — DEXAMETHASONE 4 MG PO TABS
4.0000 mg | ORAL_TABLET | Freq: Every day | ORAL | Status: DC
  Administered 2018-05-06 – 2018-05-08 (×3): 4 mg via ORAL
  Filled 2018-05-05 (×3): qty 1

## 2018-05-05 MED ORDER — DEXAMETHASONE SODIUM PHOSPHATE 4 MG/ML IJ SOLN
4.0000 mg | Freq: Once | INTRAMUSCULAR | Status: AC
  Administered 2018-05-05: 4 mg via INTRAVENOUS
  Filled 2018-05-05: qty 1

## 2018-05-05 MED ORDER — LORAZEPAM 0.5 MG PO TABS
0.2500 mg | ORAL_TABLET | Freq: Four times a day (QID) | ORAL | Status: DC | PRN
  Administered 2018-05-05: 0.25 mg via ORAL
  Filled 2018-05-05: qty 1

## 2018-05-05 MED ORDER — ZOLEDRONIC ACID 4 MG/5ML IV CONC
4.0000 mg | Freq: Once | INTRAVENOUS | Status: AC
  Administered 2018-05-05: 4 mg via INTRAVENOUS
  Filled 2018-05-05: qty 5

## 2018-05-05 MED ORDER — FENTANYL 25 MCG/HR TD PT72
25.0000 ug | MEDICATED_PATCH | TRANSDERMAL | Status: DC
  Administered 2018-05-05: 25 ug via TRANSDERMAL
  Filled 2018-05-05: qty 1

## 2018-05-05 MED ORDER — HYDROMORPHONE HCL 1 MG/ML IJ SOLN
1.0000 mg | INTRAMUSCULAR | Status: DC | PRN
  Administered 2018-05-05: 1 mg via INTRAVENOUS
  Filled 2018-05-05: qty 2

## 2018-05-05 NOTE — Consult Note (Signed)
Radiation Oncology         [phone]) [phone] ________________________________  Name: Adrian Compton        MRN: [id number]  Date of Service: 05/04/18 DOB: Oct 31, 1956  CC:[facility], [facility]  No ref. provider found     REFERRING PHYSICIAN: No ref. provider found   DIAGNOSIS: Diagnoses of Back pain and Ascites were pertinent to this visit.   HISTORY OF PRESENT ILLNESS: Adrian Compton is a 62 y.o. male seen at the request of Dr. Lindi Adie for a newly diagnosed metastatic adenocarcinoma. The patient reports he was diagnosed with hepatitis C about 17 years ago and has previously received interferon and ribavirin treatments, he is currently on transplant list and follows with Dr. Kennith Gain in [place]. He reports he is followed in surveillance with abdominal ultrasounds annually and blood work. He has had intermittent paracenteses for years and was admitted last month and found to have mediastinal adenopathy. He met with pulmonary who recommended PET scan which revealed hypermetabolism in multiple bony sites including thoracic (T1) and lumbar spine(L2), iliac disease, and also rib lesions. He had a large tumor in the liver measuring 7 cm, and underwent biopsy of the right iliac bone on 04/24/18 that revealed metastatic adenocarcinoma most likely of GI or pancreatobiliary primary. He was to see Dr. Julien Nordmann on Monday but presented on 05/03/18 with terrible back pain. On CT imaging he has been found to have a burst fracture at L3 without involvement of the cord. He also has had pain at the site of T1, and we're asked to see the patient to discuss options of palliative radiotherapy to the spine.    PREVIOUS RADIATION THERAPY: No   PAST MEDICAL HISTORY:  Past Medical History:  Diagnosis Date  . Chronic low back pain   . Cirrhosis of liver ([place])   . Cirrhosis of liver due to hepatitis C   . Hepatitis C   . Presence of right artificial knee joint        PAST SURGICAL HISTORY: Past  Surgical History:  Procedure Laterality Date  . BACK SURGERY    . IR GENERIC HISTORICAL  03/02/2017   IR PARACENTESIS 03/02/2017 [name], PA [facility]INTER[facility]  . IR PARACENTESIS  04/01/2017  . IR PARACENTESIS  05/10/2017  . IR PARACENTESIS  06/06/2017  . IR PARACENTESIS  07/18/2017  . IR PARACENTESIS  08/12/2017  . IR PARACENTESIS  09/22/2017  . IR PARACENTESIS  11/04/2017  . IR PARACENTESIS  01/05/2018  . IR PARACENTESIS  02/10/2018  . IR PARACENTESIS  02/23/2018  . IR PARACENTESIS  03/16/2018  . IR PARACENTESIS  03/29/2018  . IR PARACENTESIS  04/19/2018  . KNEE ARTHROPLASTY    . LAMINECTOMY       FAMILY HISTORY:  Family History  Problem Relation Age of Onset  . Breast cancer Mother   . Diabetes type II Mother   . Cancer Father   . Multiple sclerosis Father   . Skin cancer Brother      SOCIAL HISTORY:  reports that he quit smoking about 13 years ago. He quit smokeless tobacco use about 13 years ago. His smokeless tobacco use included snuff. He reports that he does not drink alcohol or use drugs.The patient is marreid and lives in [place].   ALLERGIES: Nuvigil [armodafinil]; Codeine; and Lactose intolerance (gi)   MEDICATIONS:  Current Facility-Administered Medications  Medication Dose Route Frequency Provider Last Rate Last Dose  . acetaminophen (TYLENOL) tablet 650 mg  650 mg  Oral Q6H PRN [name], MD       Or  . acetaminophen (TYLENOL) suppository 650 mg  650 mg Rectal Q6H PRN [name], MD      . albuterol (PROVENTIL) (2.5 MG/3ML) 0.083% nebulizer solution 3 mL  3 mL Inhalation Q6H PRN [name], MD      . cefTRIAXone (ROCEPHIN) 2 g in sodium chloride 0.9 % 100 mL IVPB  2 g Intravenous Q24H [name], MD 200 mL/hr at 05/05/18 0600 2 g at 05/05/18 0600  . feeding supplement (ENSURE ENLIVE) (ENSURE ENLIVE) liquid 237 mL  237 mL Oral BID BM [name], MD   237 mL at 05/04/18 1442  . hydrALAZINE (APRESOLINE)  injection 10 mg  10 mg Intravenous Q4H PRN [name], MD   10 mg at 05/04/18 0552  . HYDROmorphone (DILAUDID) injection 1 mg  1 mg Intravenous Q3H PRN Schorr, Rhetta Mura, NP   1 mg at 05/05/18 0441  . ondansetron (ZOFRAN) tablet 4 mg  4 mg Oral Q6H PRN [name], MD       Or  . ondansetron [facility]) injection 4 mg  4 mg Intravenous Q6H PRN [name], MD   4 mg at 05/03/18 1959  . oxyCODONE (Oxy IR/ROXICODONE) immediate release tablet 5 mg  5 mg Oral Q4H PRN Cristal Ford, DO   5 mg at 05/05/18 0356  . senna-docusate (Senokot-S) tablet 1 tablet  1 tablet Oral QHS [name], MD      . sertraline (ZOLOFT) tablet 100 mg  100 mg Oral Daily [name], MD   100 mg at 05/04/18 2229     REVIEW OF SYSTEMS: On review of systems, the patient reports that he is having a difficult time with pain relief. He has oxycodone and dilaudid available and has noticed only short term relief. Dr. Lindi Adie is looking into the frequency of his dosing to determine long acting medication. He reports hoarseness for the last 14 weeks and reports that he has not had any changes in sensory or motor function, but is limited with walking and movement due to pain in his mid and low back. No other complaints are noted.     PHYSICAL EXAM:  Wt Readings from Last 3 Encounters:  05/05/18 208 lb 15.9 oz (94.8 kg)  04/24/18 217 lb (98.4 kg)  04/13/18 217 lb (98.4 kg)   Temp Readings from Last 3 Encounters:  05/05/18 97.8 F (36.6 C) (Oral)  04/24/18 98.7 F (37.1 C) (Oral)  03/30/18 98.1 F (36.7 C) (Oral)   BP Readings from Last 3 Encounters:  05/05/18 (!) 159/86  04/24/18 (!) 165/84  04/13/18 138/80   Pulse Readings from Last 3 Encounters:  05/05/18 83  04/24/18 80  04/13/18 71   Pain Assessment Pain Score: 6 /10  In general this is a well appearing caucasian male in no acute distress. He is alert and oriented x4 and appropriate throughout the examination.  HEENT reveals that the patient is normocephalic, atraumatic. EOMs are intact. Skin is intact without any evidence of gross lesions. Cardiovascular exam reveals a regular rate and rhythm, no clicks rubs or murmurs are auscultated. Chest is clear to auscultation bilaterally though breath sounds are coarse. Lower extremities are negative for pretibial pitting edema, deep calf tenderness, cyanosis or clubbing.   ECOG = 3 0 - Asymptomatic (Fully active, able to carry on all predisease activities without restriction)  1 - Symptomatic but completely ambulatory (  Restricted in physically strenuous activity but ambulatory and able to carry out work of a light or sedentary nature. For example, light housework, office work)  2 - Symptomatic, <50% in bed during the day (Ambulatory and capable of all self care but unable to carry out any work activities. Up and about more than 50% of waking hours)  3 - Symptomatic, >50% in bed, but not bedbound (Capable of only limited self-care, confined to bed or chair 50% or more of waking hours)  4 - Bedbound (Completely disabled. Cannot carry on any self-care. Totally confined to bed or chair)  5 - Death   Eustace Pen MM, Creech RH, Tormey DC, et al. [phone]). "Toxicity and response criteria of the [facility]". [place] Oncol. 5 (6): 649-55    LABORATORY DATA:  Lab Results  Component Value Date   WBC 9.4 05/04/2018   HGB 13.3 05/04/2018   HCT 38.7 (L) 05/04/2018   MCV 87.0 05/04/2018   PLT 51 (L) 05/04/2018   Lab Results  Component Value Date   NA 138 05/04/2018   K 3.8 05/04/2018   CL 103 05/04/2018   CO2 25 05/04/2018   Lab Results  Component Value Date   ALT 16 (L) 05/03/2018   AST 29 05/03/2018   ALKPHOS 169 (H) 05/03/2018   BILITOT 2.2 (H) 05/03/2018      RADIOGRAPHY: Dg Chest 2 View  Result Date: 05/03/2018 CLINICAL DATA:  Acute onset of shortness of breath and upper abdominal pain. EXAM: CHEST - 2 VIEW COMPARISON:   Chest radiograph and CTA of the chest performed 03/28/2018; PET/CT performed 04/14/2018 FINDINGS: The lungs are well-aerated. Minimal left basilar atelectasis is noted. The known malignancy at the left lung base is not well characterized on radiograph. There is no evidence of pleural effusion or pneumothorax. The heart is normal in size; the mediastinal contour is within normal limits. No acute osseous abnormalities are seen. IMPRESSION: Minimal left basilar atelectasis noted. The known malignancy at the left lung base is not well characterized on radiograph. Electronically Signed   By: Garald Balding M.D.   On: 05/03/2018 00:00   Mr Cervical Spine Wo Contrast  Result Date: 05/03/2018 CLINICAL DATA:  Metastatic adenocarcinoma. EXAM: MRI CERVICAL SPINE WITHOUT CONTRAST TECHNIQUE: Multiplanar, multisequence MR imaging of the cervical spine was performed. No intravenous contrast was administered. COMPARISON:  Fraser Din 04/14/2018 FINDINGS: Alignment: Normal alignment.  Straightening of the cervical lordosis Image quality degraded by motion. The patient not able tolerate postcontrast imaging. MRI thoracic and lumbar spine could not be performed at this time, the patient could not tolerate further imaging Vertebrae: Negative for fracture. 12 mm hyperintensity in the T1 pedicle on the left. This is best seen on axial gradient images. This was hypermetabolic on PET and is most likely due to metastatic disease. The lesion is difficult to see on sagittal images. There may be some extension into the vertebral body on the left on sagittal inversion recovery. Hemangioma C7 vertebral body. Cord: Normal signal and morphology Posterior Fossa, vertebral arteries, paraspinal tissues: Negative Disc levels: C2-3: Negative C3-4: Mild disc and facet degeneration.  Mild spinal stenosis C4-5: Mild disc degeneration with diffuse spurring. Mild facet degeneration. Moderate spinal stenosis with cord flattening. Moderate foraminal stenosis  bilaterally. C5-6: Moderate disc degeneration and spurring, left greater than right. Cord flattening with moderate spinal stenosis. Moderate left foraminal encroachment. C6-7: Disc degeneration and spondylosis. Cord flattening with moderate spinal stenosis and moderate foraminal stenosis bilaterally C7-T1: Negative for stenosis  IMPRESSION: Image quality degraded by motion. The patient was not able to tolerate intravenous contrast for further imaging of the thoracic or lumbar spine. 12 mm lesion left C7 pedicle which was hypermetabolic on PET and is most consistent with bony metastatic disease. No extension into the canal. No fracture Multilevel spondylosis and spinal stenosis as described above. Electronically Signed   By: Franchot Gallo M.D.   On: 05/03/2018 11:39   Ct Abdomen Pelvis W Contrast  Result Date: 05/03/2018 CLINICAL DATA:  62 y/o M; central abdominal pain radiating to the left side. History of stage IV liver cancer and cirrhosis. EXAM: CT ABDOMEN AND PELVIS WITH CONTRAST CT LUMBAR SPINE WITHOUT CONTRAST TECHNIQUE: Multidetector CT imaging of the abdomen and pelvis was performed using the standard protocol following bolus administration of intravenous contrast. Multidetector CT imaging of the lumbar spine was performed without intravenous contrast administration. Multiplanar CT image reconstructions were also generated. CONTRAST:  177m ISOVUE-300 IOPAMIDOL (ISOVUE-300) INJECTION 61% COMPARISON:  04/14/2018 PET-CT.  03/30/2018 CT abdomen and pelvis. FINDINGS: CT ABDOMEN AND PELVIS Lower chest: Multiple stable pulmonary nodules in the lung bases the largest measuring 16 mm within the medial left lower lobe (series 4, image 21). Hepatobiliary: Cirrhotic liver. Ill-defined hypoattenuating central liver mass measuring up to 7.1 cm, stable from prior PET-CT given differences in technique. Additional subcentimeter nodules are present within liver segments 3, 6, 7, and 8 (series 2 image 9, 11, 14, 18, 24,  27). Tips catheter in situ. Severe enlargement of the portal venous system with lower esophageal, gastrohepatic, splenic, and mesenteric collaterals. Pancreas: Unremarkable. No pancreatic ductal dilatation or surrounding inflammatory changes. Spleen: Spleen measures 19.7 x 13.2 x 17.1 cm (volume = 2330 cm^3). Adrenals/Urinary Tract: Small caliber right kidney. Normal appearance of the left kidney. No hydronephrosis. Normal bladder. Stomach/Bowel: Stomach is within normal limits. Appendix appears normal. No evidence of bowel wall thickening, distention, or inflammatory changes. Vascular/Lymphatic: Aortic atherosclerosis. Mediastinal, portal, para-aortic lymphadenopathy is similar to prior PET-CT where it is better characterize. Reproductive: Prostatic calcification. Other: Moderate volume of ascites is increased from 04/14/2018. Musculoskeletal: As below. CT LUMBAR SPINE Segmentation: 5 lumbar type vertebrae. Alignment: Normal. Vertebrae: Multiple lucent lesions are present throughout the visible spine which are increased in size from 03/29/2018 likely representing osseous metastasis. There is a superior endplate fracture associated with the L3 vertebral body without significant loss of vertebral body height. Paraspinal and other soft tissues: As above. Disc levels: Mild multilevel discogenic degenerative changes and moderate lower lumbar facet arthrosis. Multifactorial high-grade canal stenosis at the L4-5 level. Disc and facet degenerative changes results in mild-to-moderate bilateral foraminal stenosis at L4-5 and L5-S1. IMPRESSION: 1. Multiple lucent lesions are present throughout the visible spine increased in size from 03/29/2018 compatible with osseous metastasis. New L3 superior endplate pathologic fracture without loss of vertebral body height. 2. Liver lesions, lung base pulmonary nodules, and abdominal lymphadenopathy is stable from prior PET-CT where they are better characterized. 3. Cirrhotic liver with  stigmata of severe portal hypertension. Splenomegaly, approximately 2330 cc. 4. Moderate volume of ascites is increased from 04/14/2018. Electronically Signed   By: LKristine GarbeM.D.   On: 05/03/2018 01:40   Nm Pet Image Initial (pi) Skull Base To Thigh  Result Date: 04/14/2018 CLINICAL DATA:  Initial treatment strategy for pulmonary nodules. Mediastinal lymphadenopathy. EXAM: NUCLEAR MEDICINE PET SKULL BASE TO THIGH TECHNIQUE: 12 mCi F-18 FDG was injected intravenously. Full-ring PET imaging was performed from the skull base to thigh after the radiotracer. CT data was  obtained and used for attenuation correction and anatomic localization. Fasting blood glucose: 122 mg/dl COMPARISON:  CT abdomen/pelvis dated 03/29/2018. CTA chest dated 03/28/2018. FINDINGS: Mediastinal blood pool activity: SUV max 3.1 NECK: No hypermetabolic cervical lymphadenopathy. Incidental CT findings: none CHEST: 1.6 cm irregular nodule in the medial left lower lobe (series 8/image 58), max SUV 4.1, worrisome for primary bronchogenic neoplasm. Thoracic lymphadenopathy, including: --1.8 cm short axis right paratracheal node (series 4/image 70), max SUV 6.7 --2.1 cm short axis AP window node (series 4/image 31), max SUV 7.4 --2.6 cm short axis subcarinal node (series 4/image 39), max SUV 8.4 --Left hilar node, max SUV 5.5 Incidental CT findings: Mild atherosclerotic calcifications of the aortic arch. ABDOMEN/PELVIS: Focal hypermetabolism in the central right hepatic lobe/caudate, with suspected underlying 7.2 x 4.9 cm mass on CT (series 4/image 106), max SUV 7.6. Given associated cirrhosis, differential considerations include hepatocellular carcinoma versus metastasis. Associated upper abdominal/portacaval nodes measuring up to 16 mm short axis (series 4/image 125), max SUV 7.5. Incidental CT findings: Splenomegaly. Tips shunt. Moderate abdominopelvic ascites. Atherosclerotic calcifications the abdominal aorta and branch vessels.  Sigmoid diverticulosis, without evidence of diverticulitis. SKELETON: Multifocal osseous metastases throughout the visualized axial and appendicular skeleton. Representative lesions include: --Left humeral head, max SUV 6.0 --Left T1 vertebral body, max SUV 7.8 --Left posterolateral 9th rib with pathologic fracture (series 4/image 113), max SUV 5.2 --Left L2 vertebral body, max SUV 7.9 --Right sacrum, max SUV 6.6 Incidental CT findings: Degenerative changes of the visualized thoracolumbar spine. IMPRESSION: 1.6 mm medial left lower lobe pulmonary nodule, worrisome for primary bronchogenic neoplasm. Associated thoracic nodal metastases. 7.2 cm mass in the central right hepatic lobe/caudate, worrisome for hepatocellular carcinoma versus metastasis. Additional upper abdominal nodal metastases. Multifocal osseous metastases throughout the visualized axial and appendicular skeleton, with representative lesions as above, including pathologic fracture of the left posterolateral 9th rib. Additional ancillary findings as above. Electronically Signed   By: Julian Hy M.D.   On: 04/14/2018 17:03   US Paracentesis  Result Date: 05/03/2018 INDICATION: History of cirrhosis and hepatitis C. Abdominal distention secondary to recurrent ascites. Request for diagnostic and therapeutic PARACENTESIS. EXAM: ULTRASOUND GUIDED RIGHT LOWER QUADRANT PARACENTESIS MEDICATIONS: None. COMPLICATIONS: None immediate. PROCEDURE: Informed written consent was obtained from the patient after a discussion of the risks, benefits and alternatives to treatment. A timeout was performed prior to the initiation of the procedure. Initial ultrasound scanning demonstrates a large amount of ascites within the right lower abdominal quadrant. The right lower abdomen was prepped and draped in the usual sterile fashion. 1% lidocaine with epinephrine was used for local anesthesia. Following this, a 19 gauge, 7-cm, Yueh catheter was introduced. An  ultrasound image was saved for documentation purposes. The paracentesis was performed. The catheter was removed and a dressing was applied. The patient tolerated the procedure well without immediate post procedural complication. FINDINGS: A total of approximately 5.1 L of hazy, dark yellow fluid was removed. Samples were sent to the laboratory as requested by the clinical team. IMPRESSION: Successful ultrasound-guided paracentesis yielding 5.1 liters of peritoneal fluid. Read by: Ascencion Dike PA-C Electronically Signed   By: Sandi Mariscal M.D.   On: 05/03/2018 12:57   Ct L-spine No Charge  Result Date: 05/03/2018 CLINICAL DATA:  62 y/o M; central abdominal pain radiating to the left side. History of stage IV liver cancer and cirrhosis. EXAM: CT ABDOMEN AND PELVIS WITH CONTRAST CT LUMBAR SPINE WITHOUT CONTRAST TECHNIQUE: Multidetector CT imaging of the abdomen and pelvis was performed using  the standard protocol following bolus administration of intravenous contrast. Multidetector CT imaging of the lumbar spine was performed without intravenous contrast administration. Multiplanar CT image reconstructions were also generated. CONTRAST:  16m ISOVUE-300 IOPAMIDOL (ISOVUE-300) INJECTION 61% COMPARISON:  04/14/2018 PET-CT.  03/30/2018 CT abdomen and pelvis. FINDINGS: CT ABDOMEN AND PELVIS Lower chest: Multiple stable pulmonary nodules in the lung bases the largest measuring 16 mm within the medial left lower lobe (series 4, image 21). Hepatobiliary: Cirrhotic liver. Ill-defined hypoattenuating central liver mass measuring up to 7.1 cm, stable from prior PET-CT given differences in technique. Additional subcentimeter nodules are present within liver segments 3, 6, 7, and 8 (series 2 image 9, 11, 14, 18, 24, 27). Tips catheter in situ. Severe enlargement of the portal venous system with lower esophageal, gastrohepatic, splenic, and mesenteric collaterals. Pancreas: Unremarkable. No pancreatic ductal dilatation or  surrounding inflammatory changes. Spleen: Spleen measures 19.7 x 13.2 x 17.1 cm (volume = 2330 cm^3). Adrenals/Urinary Tract: Small caliber right kidney. Normal appearance of the left kidney. No hydronephrosis. Normal bladder. Stomach/Bowel: Stomach is within normal limits. Appendix appears normal. No evidence of bowel wall thickening, distention, or inflammatory changes. Vascular/Lymphatic: Aortic atherosclerosis. Mediastinal, portal, para-aortic lymphadenopathy is similar to prior PET-CT where it is better characterize. Reproductive: Prostatic calcification. Other: Moderate volume of ascites is increased from 04/14/2018. Musculoskeletal: As below. CT LUMBAR SPINE Segmentation: 5 lumbar type vertebrae. Alignment: Normal. Vertebrae: Multiple lucent lesions are present throughout the visible spine which are increased in size from 03/29/2018 likely representing osseous metastasis. There is a superior endplate fracture associated with the L3 vertebral body without significant loss of vertebral body height. Paraspinal and other soft tissues: As above. Disc levels: Mild multilevel discogenic degenerative changes and moderate lower lumbar facet arthrosis. Multifactorial high-grade canal stenosis at the L4-5 level. Disc and facet degenerative changes results in mild-to-moderate bilateral foraminal stenosis at L4-5 and L5-S1. IMPRESSION: 1. Multiple lucent lesions are present throughout the visible spine increased in size from 03/29/2018 compatible with osseous metastasis. New L3 superior endplate pathologic fracture without loss of vertebral body height. 2. Liver lesions, lung base pulmonary nodules, and abdominal lymphadenopathy is stable from prior PET-CT where they are better characterized. 3. Cirrhotic liver with stigmata of severe portal hypertension. Splenomegaly, approximately 2330 cc. 4. Moderate volume of ascites is increased from 04/14/2018. Electronically Signed   By: LKristine GarbeM.D.   On:  05/03/2018 01:40   Ct Biopsy  Result Date: 04/24/2018 INDICATION: Hypermetabolic bone lesions including the right iliac bone EXAM: CT BIOPSY MEDICATIONS: None. ANESTHESIA/SEDATION: Fentanyl 100 mcg IV; Versed 1 mg IV Moderate Sedation Time:  10 minutes The patient was continuously monitored during the procedure by the interventional radiology nurse under my direct supervision. FLUOROSCOPY TIME:  Fluoroscopy Time:  minutes  seconds ( mGy). COMPLICATIONS: None immediate. PROCEDURE: Informed written consent was obtained from the patient after a thorough discussion of the procedural risks, benefits and alternatives. All questions were addressed. Maximal Sterile Barrier Technique was utilized including caps, mask, sterile gowns, sterile gloves, sterile drape, hand hygiene and skin antiseptic. A timeout was performed prior to the initiation of the procedure. Under CT guidance, a(n) 11 gauge guide needle was advanced into the right iliac bone lesion. An 11 gauge core was obtained. Post biopsy images demonstrate no hemorrhage. Patient tolerated the procedure well without complication. Vital sign monitoring by nursing staff during the procedure will continue as patient is in the special procedures unit for post procedure observation. FINDINGS: The images document guide needle placement  within the right iliac bone lesion. Post biopsy images demonstrate no hemorrhage. IMPRESSION: Successful CT-guided right iliac bone lesion core biopsy. Electronically Signed   By: Marybelle Killings M.D.   On: 04/24/2018 12:44   Ir Paracentesis  Result Date: 04/19/2018 INDICATION: Recurrent ascites EXAM: ULTRASOUND-GUIDED PARACENTESIS COMPARISON:  Previous paracentesis MEDICATIONS: 10 cc 2% lidocaine. COMPLICATIONS: None immediate. TECHNIQUE: Informed written consent was obtained from the patient after a discussion of the risks, benefits and alternatives to treatment. A timeout was performed prior to the initiation of the procedure. Initial  ultrasound scanning demonstrates a large amount of ascites within the left lower abdominal quadrant. The left lower abdomen was prepped and draped in the usual sterile fashion. 1% lidocaine with epinephrine was used for local anesthesia. Under direct ultrasound guidance, a 19 gauge, 7-cm, Yueh catheter was introduced. An ultrasound image was saved for documentation purposed. The paracentesis was performed. The catheter was removed and a dressing was applied. The patient tolerated the procedure well without immediate post procedural complication. FINDINGS: A total of approximately 4.2 liters of yellow fluid was removed. IMPRESSION: Successful ultrasound-guided paracentesis yielding 4.2 liters of peritoneal fluid. Read by Lavonia Drafts [facility] Electronically Signed   By: Sandi Mariscal M.D.   On: 04/19/2018 14:10       IMPRESSION/PLAN: 1. 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 I discussed the pathology findings and reviewed the nature of metastatic disease to the bone. Dr. Lisbeth Renshaw has looked at his films and recommends a course of palliative radiotherapy to the L3 and T1 sites including his mediastinal nodes. We discussed the risks, benefits, short, and long term effects of radiotherapy, and the patient is interested in proceeding. I discussed the delivery and logistics of radiotherapy and anticipates a course of 2 weeks of radiotherapy. We will simulate today and begin treatment on Monday. Written consent is obtained and placed in the chart, a copy was provided to the patient.     Carola Rhine, PAC

## 2018-05-05 NOTE — Progress Notes (Deleted)
I have sent a text message to Rolly Pancake for pain control for Mr [employer]

## 2018-05-05 NOTE — Progress Notes (Signed)
PROGRESS NOTE    Adrian Compton  MPN:[id number] DOB: [birth year] DOA: 05/02/2018 PCP: Dineen Kid, MD   Brief Narrative:  HPI On 05/03/2018 by Dr. Gean Birchwood MOUNIR SKIPPER is a 62 y.o. 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 Julien Nordmann oncologist and patient has appointment on June 3 presents to the ER because of worsening low back pain with increasing abdominal distention.  During the last month stay patient was advised about taking diuretics which patient declined.  Denies any nausea vomiting but has very poor appetite has been also having some chest pain which has been constant over the last few days with some shortness of breath mainly due to the abdominal distention and back pain.  Denies any incontinence of urine or bowel.  Interim history  Found to have mets to the spine causing compression fracture. Oncology and radiation oncology consulted, planning on palliative radiation. Palliative care consulted.  Assessment & Plan   Uncontrolled Back pain secondary to L3 compression fracture -Patient recently was diagnosed with metastatic adenocarcinoma was referred to oncology. -CT abdomen pelvis showed multiple lucent lesions present throughout the visible spine increased in size from 03/29/2018 compatible with osseous metastasis.  New L3 superior endplate pathological fracture without loss of vertebral body height. -Only able to complete MRI cervical spine without contrast secondary to pain. Does not feel that he can complete the thoracic and lumber MRI today. -MRI cervical spine: 70mm lesion left C7 to call which was hyper metabolic on PET and most consistent with bony metastatic disease. -Discussed with Dr. Julien Nordmann, however, he is referring patient to  more of an GI onc given that patient's primary is not lung.  -Dr. Lindi Adie consulted and appreciated, recommended radiation oncology for palliative radiation- to start on Monday, 6/3. -Continues to have uncontrolled pain- will change the frequency of IV dilaudid and increase oral oxycodone dose -Palliative care consulted for pain management -patient may need fentanyl patch, however, will discuss with palliative  Abdominal distention secondary to ascites with history of cirrhosis and hepatitis C -Patient placed empirically on ceftriaxone -CT abdomen pelvis showed cirrhotic liver with stigmata of severe portal hypertension.  Splenomegaly, approximately 2330cc. Moderate volume of ascites.  -s/p Ultrasound-guided paracentesis yielding 5.1L  -Patient has refused diuretics in the past  Chronic anemia and thrombocytopenia -Secondary to cirrhosis -Continue to monitor CBC  Elevated blood pressure, no history of HTN -suspect secondary to pain -Continue hydralazine PRN  Metastatic adenocarcinoma -CT abdomen pelvis also noted liver lesions, lung base pulmonary nodules and abdominal lymphadenopathy -Oncology and radiation oncology consulted and appreciated -patient does not wish to pursue chemotherapy treatment however is open to radiation treatment -radiation oncology consulted and appreciated, plan for radiation to start on 6/3   Moderate malnutrition -nutrition consulted, continue supplements  Goals of care -Discussed with patient and wife at bedside- options such as hospice, comfort care, etc -Palliative care consulted and appreciated   DVT Prophylaxis  SCDs  Code Status: DNR  Family Communication:Wife at bedside  Disposition Plan: Admitted.  Pending improvement in pain control-palliative care consulted.   Consultants Oncology Radiation oncology Palliative care  Procedures  US guided paracentesis  Antibiotics   Anti-infectives (From admission, onward)   Start     Dose/Rate  Route Frequency Ordered Stop   05/03/18 0600  cefTRIAXone (ROCEPHIN) 2 g in sodium chloride 0.9 % 100 mL IVPB     2 g 200 mL/hr over 30 Minutes Intravenous Every 24 hours 05/03/18 0455        Subjective:   [name] seen and examined today.  Continues to have back pain.  States he used to be on high doses of oral medications at home.  Would like to be as comfortable as possible.  Denies current chest pain, shortness of breath, abdominal pain, nausea or vomiting, diarrhea or constipation.  Objective:   Vitals:   05/04/18 0628 05/04/18 1517 05/04/18 2132 05/05/18 0444  BP: (!) 171/85 (!) 167/91 (!) 164/100 (!) 159/86  Pulse: 89 86 87 83  Resp:  15 13 14   Temp:  97.9 F (36.6 C) 97.9 F (36.6 C) 97.8 F (36.6 C)  TempSrc:  Oral Oral Oral  SpO2:  95% 96% 95%  Weight:    94.8 kg (208 lb 15.9 oz)  Height:        Intake/Output Summary (Last 24 hours) at 05/05/2018 1137 Last data filed at 05/05/2018 1021 Gross per 24 hour  Intake 480 ml  Output -  Net 480 ml   Filed Weights   05/02/18 2109 05/05/18 0444  Weight: 98.9 kg (218 lb) 94.8 kg (208 lb 15.9 oz)   Exam  General: Well developed, well nourished, mild distress from pain  HEENT: NCAT, mild scleral icterus, mucous membranes moist.   Neck: Supple  Cardiovascular: S1 S2 auscultated, no rubs, murmurs or gallops. Regular rate and rhythm.  Respiratory: Clear to auscultation bilaterally with equal chest rise  Abdomen: Soft, nontender, nondistended, + bowel sounds  Extremities: warm dry without cyanosis clubbing or edema  Neuro: AAOx3, nonfocal  Skin: Without rashes exudates or nodules  Psych: appropriate mood and affect, intact judgment and insight   Data Reviewed: I have personally reviewed following labs and imaging studies  CBC: Recent Labs  Lab 05/02/18 2224 05/03/18 0604 05/04/18 0617 05/05/18 0630  WBC 6.3 6.3 9.4 8.9  NEUTROABS 5.2 5.2  --   --   HGB 12.6* 12.6* 13.3 13.5  HCT 36.8* 36.2*  38.7* 38.8*  MCV 87.0 88.1 87.0 87.6  PLT 50* 45* 51* 47*   Basic Metabolic Panel: Recent Labs  Lab 05/02/18 2224 05/03/18 0604 05/04/18 0617 05/05/18 0630  NA 138 137 138 137  K 4.5 4.2 3.8 4.1  CL 101 101 103 99*  CO2 29 25 25 31   GLUCOSE 120* 117* 123* 112*  BUN 19 19 25* 25*  CREATININE 0.94 0.86 0.80 0.91  CALCIUM 10.1 9.8 9.9 9.9   GFR: Estimated Creatinine Clearance: 101.9 mL/min (by C-G formula based on SCr of 0.91 mg/dL). Liver Function Tests: Recent Labs  Lab 05/02/18 2224 05/03/18 0604  AST 31 29  ALT 17 16*  ALKPHOS 175* 169*  BILITOT 2.2* 2.2*  PROT 7.7 7.3  ALBUMIN 3.9 3.7   Recent Labs  Lab 05/02/18 2224  LIPASE 26   No results for input(s): AMMONIA in the last 168 hours. Coagulation Profile: No results for input(s): INR, PROTIME in the last 168 hours. Cardiac Enzymes: Recent Labs  Lab 05/03/18 0604 05/03/18 1356 05/03/18 1927  TROPONINI <0.03 <0.03 <0.03   BNP (last 3 results) No results for input(s): PROBNP in the last 8760 hours. HbA1C: No results for input(s): HGBA1C in the last 72 hours. CBG: No results for input(s): GLUCAP in the last 168 hours. Lipid Profile: No results for input(s): CHOL, HDL, LDLCALC, TRIG, CHOLHDL,  LDLDIRECT in the last 72 hours. Thyroid Function Tests: No results for input(s): TSH, T4TOTAL, FREET4, T3FREE, THYROIDAB in the last 72 hours. Anemia Panel: No results for input(s): VITAMINB12, FOLATE, FERRITIN, TIBC, IRON, RETICCTPCT in the last 72 hours. Urine analysis:    Component Value Date/Time   COLORURINE AMBER (A) 05/24/2015 1239   APPEARANCEUR CLEAR 05/24/2015 1239   LABSPEC 1.015 05/24/2015 1239   PHURINE 5.5 05/24/2015 1239   GLUCOSEU NEGATIVE 05/24/2015 1239   HGBUR NEGATIVE 05/24/2015 1239   BILIRUBINUR NEGATIVE 05/24/2015 1239   KETONESUR NEGATIVE 05/24/2015 1239   PROTEINUR NEGATIVE 05/24/2015 1239   UROBILINOGEN 1.0 05/24/2015 1239   NITRITE NEGATIVE 05/24/2015 1239   LEUKOCYTESUR NEGATIVE  05/24/2015 1239   Sepsis Labs: @LABRCNTIP (procalcitonin:4,lacticidven:4)  ) Recent Results (from the past 240 hour(s))  Culture, body fluid-bottle     Status: None (Preliminary result)   Collection Time: 05/03/18 12:39 PM  Result Value Ref Range Status   Specimen Description PERITONEAL  Final   Special Requests NONE  Final   Culture   Final    NO GROWTH 2 DAYS Performed at [facility], [place] [address], [place], [place] [address]    Report Status PENDING  Incomplete  Gram stain     Status: None   Collection Time: 05/03/18 12:39 PM  Result Value Ref Range Status   Specimen Description PERITONEAL  Final   Special Requests NONE  Final   Gram Stain   Final    RARE WBC PRESENT, PREDOMINANTLY MONONUCLEAR NO ORGANISMS SEEN Performed at [facility], [address] [place], [place] [address]    Report Status 05/04/2018 FINAL  Final      Radiology Studies: US Paracentesis  Result Date: 05/03/2018 INDICATION: History of cirrhosis and hepatitis C. Abdominal distention secondary to recurrent ascites. Request for diagnostic and therapeutic PARACENTESIS. EXAM: ULTRASOUND GUIDED RIGHT LOWER QUADRANT PARACENTESIS MEDICATIONS: None. COMPLICATIONS: None immediate. PROCEDURE: Informed written consent was obtained from the patient after a discussion of the risks, benefits and alternatives to treatment. A timeout was performed prior to the initiation of the procedure. Initial ultrasound scanning demonstrates a large amount of ascites within the right lower abdominal quadrant. The right lower abdomen was prepped and draped in the usual sterile fashion. 1% lidocaine with epinephrine was used for local anesthesia. Following this, a 19 gauge, 7-cm, Yueh catheter was introduced. An ultrasound image was saved for documentation purposes. The paracentesis was performed. The catheter was removed and a dressing was applied. The patient tolerated the procedure well without immediate post procedural  complication. FINDINGS: A total of approximately 5.1 L of hazy, dark yellow fluid was removed. Samples were sent to the laboratory as requested by the clinical team. IMPRESSION: Successful ultrasound-guided paracentesis yielding 5.1 liters of peritoneal fluid. Read by: Ascencion Dike PA-C Electronically Signed   By: Sandi Mariscal M.D.   On: 05/03/2018 12:57     Scheduled Meds: . famotidine  20 mg Oral BID  . feeding supplement (ENSURE ENLIVE)  237 mL Oral BID BM  . senna-docusate  1 tablet Oral QHS  . sertraline  100 mg Oral Daily   Continuous Infusions: . cefTRIAXone (ROCEPHIN)  IV Stopped (05/05/18 0700)     LOS: 2 days   Time Spent in minutes   45 minutes  Dallen Bunte D.O. on 05/05/2018 at 11:37 AM  Between 7am to 7pm - Pager - [phone]  After 7pm go to [email or web address] - password [email or web address]  And look for the night coverage person  covering for me after hours  [facility] Office  [phone]

## 2018-05-05 NOTE — Consult Note (Signed)
Consultation Note Date: 05/05/2018   Patient Name: Adrian Compton  DOB: 02-Jun-1956  MRN: [id number]  Age / Sex: 62 y.o., male  PCP: ViaLennette Bihari, MD Referring Physician: Cristal Ford, DO  Reason for Consultation: Establishing goals of care, Non pain symptom management, Pain control and Psychosocial/spiritual support  HPI/Patient Profile: 62 y.o. male admitted on 05/02/2018 from home with worsening lower back pain and increased abdominal distention. He has a past medical history significant for hepatitis C with cirrhosis of liver, chronic low back pain, ascites (requiring frequent paracentesis), tobacco smoker, and most recently diagnosed metastatic adenocarcinoma of the lung. He was admitted last month and was found to have lymphadenopathy and was referred to a pulmonologist. He had a PET scan and iliac bone biopsy which showed metastatic adenocarcinoma and was referred to Dr. Julien Nordmann oncologist. His initial appointment was scheduled for June 3. During his ED course he denies nausea or vomiting, but endorses a very poor appetite, constant chest pain, and shortness of breath mainly de to abdominal distention and back pain. He denied any incontinence of bowel or bladder. Patient had CT of the abdomen and pelvis and CT lumbar spine which shows new compression fracture of the L3 with multiple metastatic lesions in the spine.  Also has moderate ascites with portal hypertension.  On-call oncologist Dr. Rozetta Nunnery was consulted and advised to get MRI of the spine and reconsult Dr. Julien Nordmann. Since admission patient is experiencing uncontrolled pain. Palliative Medicine consulted for goals of care and to assist with pain control.   Clinical Assessment and Goals of Care: I have reviewed medical records including lab results, imaging, Epic notes, and MAR, received report from the bedside RN, and assessed the patient. I then met at  the bedside with patient, his wife, Remo Lipps, and his brother to discuss diagnosis prognosis, [place], EOL wishes, disposition and options. Mr. Bolander is alert and oriented x3. He is appropriate to engage in goals of care discussion with his family and I.   I introduced Palliative Medicine as specialized medical care for people living with serious illness. It focuses on providing relief from the symptoms and stress of a serious illness. The goal is to improve quality of life for both the patient and the family.  We discussed a brief life review of the patient.  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  He is married to his wife for 17 years.  They were actually childhood friends and have known each other since the age of 62 years old.  They had a son who passed away at the age of 71 months due to SIDS.  Patient is of Christian faith.  He and his wife lived in [place], [place] on their own farm up until 2013, once he became sick with cirrhosis they then moved back to [place].  He loves to golf and football, being at [employer] and spending time with family and friends.  They have a dog that he  loves dearly named Guilford.  As far as functional and nutritional status wife states he began to decline in health and 2013 when he had a severe episode with his cirrhosis.  At that time he was forced to retire and become disabled.  Patient states he began having throat pain and issues with indigestion back in January 2019.  At that time he was seen in the emergency department and asked to follow-up with a ENT specialist.  Unfortunately he did not follow-up as suggested and continue to self medicate.  His symptoms began to worsen and include some chest pain, this is when he presented to the emergency department the end of April and was then found to have cancer.  Patient states he has had a decrease in appetite over the past 2 to  3 weeks with a weight loss of approximately 15 to 20 pounds over the past 2 months.  At this time he was also ambulatory and able to perform all ADLs independently.  Unfortunately over the past week or so he has increased fatigue and weakness, has interest in things that he loves, and states he can barely walk or function without taking breaks and experiencing shortness of breath.   We discussed his current illness and what it means in the larger context of his on-going co-morbidities.  Natural disease trajectory and expectations at EOL were discussed.  Patient was very tearful during conversation as well as his family.  He verbalizes his understanding of metastatic cancer, and expresses he knows that his time here on earth is limited.  I attempted to elicit values and goals of care important to the patient.    The difference between aggressive medical intervention and comfort care was considered in light of the patient's goals of care.  At this time patient would not want any forms of aggressive medical interventions.  During his hospitalization he would like to continue to treat the treatable with his most important goal of pain management.  He states his pain is unbearable at times and he does not want to continue to suffer in this manner.  He did he wishes not to be full comfort care at this time.  Advanced directives, concepts specific to code status, artifical feeding and hydration, and rehospitalization were considered and discussed.  Patient needs to DNR/DNI.  They are not interested in any forms of life-sustaining measures, including artificial feeding, PEG tube placement, or dialysis.  Hospice and Palliative Care services outpatient were explained and offered.  Patient and family agrees at this time he would like to return home to his own environment with hospice services.  We discussed the goals of hospice care and comfort care within his home.  Family verbalized understanding that in addition  to the services hospice would provide that family will be responsible for 24/7 care.  His brother and wife states they feel that they can manage his care in the home with the support of hospice.  Patient is scheduled to begin palliative radiation with hopes of some pain relief.  Questions and concerns were addressed.  Hard Choices booklet left for review. The family was encouraged to call with questions or concerns.  PMT will continue to support holistically.  Primary Decision Maker: HCPOA-Wife, Briscoe Burns.  At this time patient is alert and oriented x3 and able to make healthcare decisions.    SUMMARY OF RECOMMENDATIONS    DNR/DNI at patient/family's request  Continue to treat the treatable while hospitalized at patient's request.  Patient verbalized  understanding that his condition is not curable and the ultimate goal is comfort care as he so desires not to see return home with hospice.  His main goal and focus at this time is for pain relief as he continues to struggle with generalized pain and severe lower back pain.  Case management consult for outpatient hospice services at discharge.  Diet has been changed to regular diet.  Although patient is not full comfort at this point it is feasible for him to be able to have what ever it is that he would like to eat for comfort measures.  Decadron 4 mg IV 1 loading dose, patient was then received Decadron 4 mg by mouth daily.  Zometa 4 mg IV x1 dose for bone support and pain.  Ativan 0.25 mg every 6 hours as needed for anxiety and sleep.  We will increase PRN Dilaudid IV to 1 to 2 mg for moderate to severe pain.  Agreed to continue with OxyIR 15 mg as needed for pain.  Fentanyl 25 mcg patch for pain.  We will schedule I milligram IV Dilaudid every 4 hours x 8 doses to assist with pain control and allow for fentanyl patch to begin to be somewhat effective.   Palliative medicine team will continue to support patient, family, and medical  team during hospitalization.  Code Status/Advance Care Planning:  DNR/DNI as documented and per patient's request   Symptom Management:   Decadron 4 mg IV loading dose and Decadron 4 mg by mouth daily for pain control.  Zometa 4 mg IV x1 dose for bone and pain control  Ativan 0.25 mg by mouth every 6 hours as needed for anxiety and for sleep.  Dilaudid IV 1 to 2 mg as needed for moderate to severe pain.  Dilaudid IV 1 mg scheduled every 4 hours x 8 doses for pain control.  Fentanyl 25 mcg patch every 72 hours for long-acting pain control.  OxyIR 15 mg every 4 hours as needed for moderate pain.  Palliative Prophylaxis:   Bowel Regimen, Delirium Protocol and Frequent Pain Assessment  Additional Recommendations (Limitations, Scope, Preferences):  Full Scope Treatment and Initiate Comfort Feeding  Psycho-social/Spiritual:   Desire for further Chaplaincy support:NO   Prognosis:   < 3 months-in the setting of metastatic adenocarcinoma of the lung, spinal cord compression fracture of L3 lumbar vertebrae, uncontrolled pain, decreased mobility, shortness of breath, poor p.o. intake, hepatitis C with cirrhosis, and recurrent ascites requiring paracentesis.  Discharge Planning: Home with Hospice      Primary Diagnoses: Present on Admission: . Low back pain . Closed compression fracture of L3 lumbar vertebra, initial encounter ([place]) . Ascites . Hepatic cirrhosis due to chronic hepatitis C infection ([place]) . Thrombocytopenia, secondary due to cirrhosis . Elevated blood pressure reading . Pathological compression fracture of spine ([place])   I have reviewed the medical record, interviewed the patient and family, and examined the patient. The following aspects are pertinent.  Past Medical History:  Diagnosis Date  . Chronic low back pain   . Cirrhosis of liver ([place])   . Cirrhosis of liver due to hepatitis C   . Hepatitis C   . Presence of right artificial knee joint     Social History   Socioeconomic History  . Marital status: Married    Spouse name: Not on file  . Number of children: Not on file  . Years of education: Not on file  . Highest education level: Not on file  Occupational History  .  Not on file  Social Needs  . Financial resource strain: Not on file  . Food insecurity:    Worry: Not on file    Inability: Not on file  . Transportation needs:    Medical: Not on file    Non-medical: Not on file  Tobacco Use  . Smoking status: Former Smoker    Last attempt to quit: 04/12/2005    Years since quitting: 13.0  . Smokeless tobacco: Former Systems developer    Types: Snuff    Quit date: 04/12/2005  Substance and Sexual Activity  . Alcohol use: No  . Drug use: No  . Sexual activity: Never  Lifestyle  . Physical activity:    Days per week: Not on file    Minutes per session: Not on file  . Stress: Not on file  Relationships  . Social connections:    Talks on phone: Not on file    Gets together: Not on file    Attends religious service: Not on file    Active member of club or organization: Not on file    Attends meetings of clubs or organizations: Not on file    Relationship status: Not on file  Other Topics Concern  . Not on file  Social History Narrative  . Not on file   Family History  Problem Relation Age of Onset  . Breast cancer Mother   . Diabetes type II Mother   . Cancer Father   . Multiple sclerosis Father   . Skin cancer Brother    Scheduled Meds: . dexamethasone  4 mg Intravenous Once  . [START ON 05/06/2018] dexamethasone  4 mg Oral Daily  . famotidine  20 mg Oral BID  . feeding supplement (ENSURE ENLIVE)  237 mL Oral BID BM  . fentaNYL  25 mcg Transdermal Q72H  .  HYDROmorphone (DILAUDID) injection  1 mg Intravenous Q4H  . senna-docusate  1 tablet Oral QHS  . sertraline  100 mg Oral Daily   Continuous Infusions: . cefTRIAXone (ROCEPHIN)  IV Stopped (05/05/18 0700)  . zoledronic acid (ZOMETA) IV     PRN  Meds:.acetaminophen **OR** acetaminophen, albuterol, gi cocktail, hydrALAZINE, HYDROmorphone (DILAUDID) injection, LORazepam, ondansetron **OR** ondansetron (ZOFRAN) IV Medications Prior to Admission:  Prior to Admission medications   Medication Sig Start Date End Date Taking? Authorizing Provider  albuterol (PROVENTIL HFA;VENTOLIN HFA) 108 (90 BASE) MCG/ACT inhaler Inhale 2 puffs into the lungs every 6 (six) hours as needed for wheezing.   Yes [provider]  Lidocaine 4 % PTCH Apply 1 patch topically daily as needed (pain).   Yes [provider]  ondansetron (ZOFRAN) 4 MG tablet Take 1 tablet (4 mg total) by mouth every 6 (six) hours. Patient taking differently: Take 4 mg by mouth every 6 (six) hours as needed.  06/06/14  Yes [name], [name], PA-C  Oxycodone HCl 10 MG TABS Take 10 mg by mouth every 8 (eight) hours.    Yes [provider]  sertraline (ZOLOFT) 100 MG tablet Take 100 mg by mouth daily.   Yes [provider]  phytonadione (VITAMIN K) 5 MG tablet Take one 48m tablet daily 3 days prior to procedure. Patient not taking: Reported on 05/02/2018 04/13/18   ARigoberto Noel MD   Allergies  Allergen Reactions  . Nuvigil [Armodafinil] Anaphylaxis and Hives  . Codeine Nausea And Vomiting  . Lactose Intolerance (Gi) Other (See Comments)    unspecified   Review of Systems  Constitutional: Positive for activity change,  appetite change, fatigue and unexpected weight change.  Respiratory: Positive for shortness of breath.   Gastrointestinal: Positive for abdominal distention.  Musculoskeletal: Positive for back pain.  Neurological: Positive for weakness.  All other systems reviewed and are negative.   Physical Exam  Constitutional: He is oriented to person, place, and time. Vital signs are normal. He is cooperative. He appears ill.  Thin and fragile appearing. Tremors related to uncontrolled pain  Cardiovascular: Normal rate, regular rhythm, normal heart  sounds, intact distal pulses and normal pulses.  Pulmonary/Chest: Effort normal. He has decreased breath sounds.  Abdominal: He exhibits distension.  Musculoskeletal:       Thoracic back: He exhibits tenderness and pain.       Lumbar back: He exhibits pain.  Neurological: He is alert and oriented to person, place, and time.  Skin: Skin is warm and dry.  Psychiatric: His speech is normal. Thought content normal. His mood appears anxious. Cognition and memory are normal. He expresses inappropriate judgment.  Nursing note and vitals reviewed.   Vital Signs: BP (!) 163/92 (BP Location: Left Arm)   Pulse (!) 101   Temp 98 F (36.7 C) (Oral)   Resp 16   Ht 6' 3"  (1.905 m)   Wt 94.8 kg (208 lb 15.9 oz)   SpO2 95%   BMI 26.12 kg/m  Pain Scale: 0-10 POSS *See Group Information*: S-Acceptable,Sleep, easy to arouse Pain Score: 8    SpO2: SpO2: 95 % O2 Device:SpO2: 95 % O2 Flow Rate: .   IO: Intake/output summary:   Intake/Output Summary (Last 24 hours) at 05/05/2018 1450 Last data filed at 05/05/2018 1243 Gross per 24 hour  Intake 480 ml  Output -  Net 480 ml    LBM: Last BM Date: 05/04/18 Baseline Weight: Weight: 98.9 kg (218 lb) Most recent weight: Weight: 94.8 kg (208 lb 15.9 oz)     Palliative Assessment/Data:PPS 30%   Time In: 1330 Time Out: 1445 Time Total: 75 min. Greater than 50%  of this time was spent counseling and coordinating care related to the above assessment and plan.  Signed by: Alda Lea, NP-BC Palliative Medicine Team  Phone: [phone] Fax: [phone]   Please contact Palliative Medicine Team phone at [phone] for questions and concerns.  For individual provider: See Shea Evans

## 2018-05-05 NOTE — Care Management Important Message (Signed)
Important Message  Patient Details  Name: HAL NORRINGTON MRN: [id number] Date of Birth: [birth year]   Medicare Important Message Given:  Yes    Kerin Salen 05/05/2018, 11:34 AMImportant Message  Patient Details  Name: KOLTEN RYBACK MRN: [id number] Date of Birth: [birth year]   Medicare Important Message Given:  Yes    Kerin Salen 05/05/2018, 11:33 AM

## 2018-05-05 NOTE — Progress Notes (Signed)
Palliative Note:  Mr. Adrian Compton continues to have complications with pain management.  Received a page from [place], [place] who states patient is very uncomfortable and in severe pain causing tremors.  Patient is tearful and anxious when spoken to over the phone regarding his pain.  States despite changes in medication regimen earlier in addition of steroids pain continues to escalate and is unbearable.  I spoke in detail with patient and wife, and we discussed the ultimate goal is to get patient as comfortable as possible to the point that he can return home with hospice.  He does not want to initiate full comfort measures in the hospital however would like more aggressive measures for pain control.  Medications will be adjusted with hopes of providing some relief.  Recommendations:  Scheduled IV Dilaudid and as needed Dilaudid will be discontinued.  Fentanyl patch will be discontinued.  This may be restarted once pain is better controlled at a later date.  We will schedule 0.5 mg IV Ativan for anxiety.  Parameters will be set to hold for sedation.  We will continue with as needed Ativan as needed for breakthrough anxiety and agitation/sleep.  Continue with Decadron 4 mg p.o. daily starting tomorrow.  He has received loading dose of Decadron 4 mg IV as well as 4 mg Zometa IV x1 time dose.  Based on total PRN dosages received over the past 24 hours patient will be initiated on Dilaudid PCA pump with hopes of facilitating better pain control.  Dilaudid PCA with 1 mg bolus every 30 minutes with a 2 mg lockout every hour.  Basal rate of 1 mg and no loading dose as patient has recently received IV PRN medication.  PCA orders initiated according to protocol.  Current pain medication regimen has been discussed with Dr. Ree Kida.  The medicine team will continue to closely monitor for effectiveness and any further adjustments that need to be made.  Total time spent with patient was 35 minutes.  More than 50%  of time spent with patient.  Alda Lea, NP-BC Palliative Medicine Team  Phone: [phone] Fax: [phone]

## 2018-05-06 DIAGNOSIS — R52 Pain, unspecified: Secondary | ICD-10-CM

## 2018-05-06 DIAGNOSIS — M4850XA Collapsed vertebra, not elsewhere classified, site unspecified, initial encounter for fracture: Secondary | ICD-10-CM

## 2018-05-06 DIAGNOSIS — Z515 Encounter for palliative care: Secondary | ICD-10-CM

## 2018-05-06 DIAGNOSIS — E440 Moderate protein-calorie malnutrition: Secondary | ICD-10-CM

## 2018-05-06 DIAGNOSIS — Z66 Do not resuscitate: Secondary | ICD-10-CM

## 2018-05-06 DIAGNOSIS — Z7189 Other specified counseling: Secondary | ICD-10-CM

## 2018-05-06 MED ORDER — HYDROMORPHONE 1 MG/ML IV SOLN
INTRAVENOUS | Status: DC
  Administered 2018-05-06: 5.43 mg via INTRAVENOUS
  Administered 2018-05-06: 09:00:00 via INTRAVENOUS
  Administered 2018-05-06: 1.04 mg via INTRAVENOUS
  Administered 2018-05-06: 1.75 mg via INTRAVENOUS
  Administered 2018-05-07: 3 mg via INTRAVENOUS
  Administered 2018-05-07: 3.68 mg via INTRAVENOUS
  Administered 2018-05-07: 0.6 mg via INTRAVENOUS
  Administered 2018-05-07: 2 mg via INTRAVENOUS
  Administered 2018-05-07: 2.5 mg via INTRAVENOUS
  Administered 2018-05-08: 2 mg via INTRAVENOUS
  Administered 2018-05-08: 2.5 mg via INTRAVENOUS
  Administered 2018-05-08: 3.25 mg via INTRAVENOUS
  Administered 2018-05-08: 1.5 mg via INTRAVENOUS
  Filled 2018-05-06 (×2): qty 25

## 2018-05-06 NOTE — Progress Notes (Signed)
Palliative Medicine Inpatient Consult Follow Up Note   Name: Adrian Compton Date: 05/06/2018 MRN: [id number]  DOB: [birth year]  Referring Physician: Cristal Ford, DO  HPI: 62 y.o. male admitted on 05/02/2018 from home with worsening lower back pain and increased abdominal distention. He has a past medical history significant for hepatitis C with cirrhosis of liver, chronic low back pain, ascites (requiring frequent paracentesis), tobacco smoker, and most recently diagnosed metastatic adenocarcinoma of the lung. He was admitted last month and was found to have lymphadenopathy and was referred to a pulmonologist. He had a PET scan and iliac bone biopsy which showed metastatic adenocarcinoma and was referred to Dr. Julien Nordmann oncologist. His initial appointment was scheduled for June 3. During his ED course he denies nausea or vomiting, but endorses a very poor appetite, constant chest pain, and shortness of breath mainly de to abdominal distention and back pain. He denied any incontinence of bowel or bladder. Patient had CT of the abdomen and pelvis and CT lumbar spine which shows new compression fracture of the L3 with multiple metastatic lesions in the spine. Also has moderate ascites with portal hypertension. On-call oncologist Dr. Rozetta Nunnery was consulted and advised to get MRI of the spine and reconsult Dr. Julien Nordmann. Since admission patient is experiencing uncontrolled pain. Palliative Medicine consulted for goals of care and to assist with pain control.   REVIEW OF SYSTEMS:  Pain: Moderate  CODE STATUS: DNR   PAST MEDICAL HISTORY: Past Medical History:  Diagnosis Date  . Chronic low back pain   . Cirrhosis of liver ([place])   . Cirrhosis of liver due to hepatitis C   . Hepatitis C   . Presence of right artificial knee joint     PAST SURGICAL HISTORY:  Past Surgical History:  Procedure Laterality Date  . BACK SURGERY    . IR GENERIC HISTORICAL  03/02/2017   IR PARACENTESIS 03/02/2017 [name], PA [facility]  . IR PARACENTESIS  04/01/2017  . IR PARACENTESIS  05/10/2017  . IR PARACENTESIS  06/06/2017  . IR PARACENTESIS  07/18/2017  . IR PARACENTESIS  08/12/2017  . IR PARACENTESIS  09/22/2017  . IR PARACENTESIS  11/04/2017  . IR PARACENTESIS  01/05/2018  . IR PARACENTESIS  02/10/2018  . IR PARACENTESIS  02/23/2018  . IR PARACENTESIS  03/16/2018  . IR PARACENTESIS  03/29/2018  . IR PARACENTESIS  04/19/2018  . KNEE ARTHROPLASTY    . LAMINECTOMY      Vital Signs: BP (!) 153/95 (BP Location: Right Arm)   Pulse 92   Temp 97.9 F (36.6 C) (Oral)   Resp 18   Ht 6\' 3"  (1.905 m)   Wt 208 lb 15.9 oz (94.8 kg)   SpO2 95%   BMI 26.12 kg/m  [facility] Weights   05/02/18 2109 05/05/18 0444  Weight: 218 lb (98.9 kg) 208 lb 15.9 oz (94.8 kg)    Estimated body mass index is 26.12 kg/m as calculated from the following:   Height as of this encounter: 6\' 3"  (1.905 m).   Weight as of this encounter: 208 lb 15.9 oz (94.8 kg).  PHYSICAL EXAM: General: NAD, frail appearing, thin Cardiovascular: regular rate and rhythm Pulmonary: clear ant fields Abdomen: soft, nontender, + bowel sounds GU: no suprapubic tenderness Extremities: no edema, no joint deformities Skin: no rashes Neurological: Weakness but otherwise nonfocal  LABS: CBC:    Component Value Date/Time   WBC 8.9 05/05/2018 0630   HGB 13.5 05/05/2018 0630   HCT  38.8 (L) 05/05/2018 0630   PLT 47 (L) 05/05/2018 0630   MCV 87.6 05/05/2018 0630   NEUTROABS 5.2 05/03/2018 0604   LYMPHSABS 0.4 (L) 05/03/2018 0604   MONOABS 0.5 05/03/2018 0604   EOSABS 0.1 05/03/2018 0604   BASOSABS 0.0 05/03/2018 0604   Comprehensive Metabolic Panel:    Component Value Date/Time   NA 137 05/05/2018 0630   K 4.1 05/05/2018 0630   CL 99 (L) 05/05/2018 0630   CO2 31 05/05/2018 0630   BUN 25 (H) 05/05/2018 0630   CREATININE 0.91 05/05/2018 0630   GLUCOSE 112 (H) 05/05/2018 0630   CALCIUM 9.9 05/05/2018 0630   AST 29 05/03/2018  0604   ALT 16 (L) 05/03/2018 0604   ALKPHOS 169 (H) 05/03/2018 0604   BILITOT 2.2 (H) 05/03/2018 0604   PROT 7.3 05/03/2018 0604   ALBUMIN 3.7 05/03/2018 0604    IMPRESSION:  Patient has been on hydromorphone PCA overnight with approximately 11.62mg  administered in 12 hours from 2000 to 0800. He does not appear to have used any of the bolus dosing on the PCA overnight. This morning, patient was somewhat lethargic upon my entering the room. Once awake, he was alert and oriented and endorsed ongoing back pain, which he rated 7/10. He says his sleep last night was the best it has been in days. Given initial lethargy and no use of prn dosing, will decrease PCA to 0.5mg  continuous with 0.5mg  prn dose every 35minutes with a 2mg  hourly lock-out.   I called and spoke with attending. I note that radiation oncology plans to start XRT to spine, likely on Monday. Patient will likely stay inpatient until then. Will need to transition to oral opioids once pain regimen is stable.   PLAN: 1. Reduce dose of hydromorphone PCA to 0.5mg  q1hour with 0.5mg  q4minutes bolus dose.  2. Continue dexamethasone 3. Note plan for XRT 4. Disposition likely home with hospice once pain regimen is stable.   REFERRALS TO BE ORDERED:  Hospice   More than 50% of the visit was spent in counseling/coordination of care: YES  Time Spent: 30 minutes

## 2018-05-06 NOTE — Progress Notes (Signed)
PROGRESS NOTE    Adrian Compton  ZOX:[id number] DOB: 1956-09-11 DOA: 05/02/2018 PCP: Dineen Kid, MD   Brief Narrative:  HPI On 05/03/2018 by Dr. Gean Birchwood Adrian Compton is a 62 y.o. 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 Julien Nordmann oncologist and patient has appointment on June 3 presents to the ER because of worsening low back pain with increasing abdominal distention.  During the last month stay patient was advised about taking diuretics which patient declined.  Denies any nausea vomiting but has very poor appetite has been also having some chest pain which has been constant over the last few days with some shortness of breath mainly due to the abdominal distention and back pain.  Denies any incontinence of urine or bowel.  Interim history  Found to have mets to the spine causing compression fracture. Oncology and radiation oncology consulted, planning on palliative radiation. Palliative care consulted.  Assessment & Plan   Uncontrolled Back pain secondary to L3 compression fracture -Patient recently was diagnosed with metastatic adenocarcinoma was referred to oncology. -CT abdomen pelvis showed multiple lucent lesions present throughout the visible spine increased in size from 03/29/2018 compatible with osseous metastasis.  New L3 superior endplate pathological fracture without loss of vertebral body height. -Only able to complete MRI cervical spine without contrast secondary to pain. Does not feel that he can complete the thoracic and lumber MRI today. -MRI cervical spine: 54mm lesion left C7 to call which was hyper metabolic on PET and most consistent with bony metastatic disease. -Discussed with Dr. Julien Nordmann, however, he is referring patient to  more of an GI onc given that patient's primary is not lung.  -Dr. Lindi Adie consulted and appreciated, recommended radiation oncology for palliative radiation- to start on Monday, 6/3. -Palliative care consulted for pain management -Despite changes and pain regimen, patient continues to have 8 out of 10 pain.  Discussed with palliative care, patient started on Dilaudid PCA pump.  Patient did have some somnolence this morning, parameters of PCA pump were changed. -Continue dexamethasone  Abdominal distention secondary to ascites with history of cirrhosis and hepatitis C -Patient placed empirically on ceftriaxone -CT abdomen pelvis showed cirrhotic liver with stigmata of severe portal hypertension.  Splenomegaly, approximately 2330cc. Moderate volume of ascites.  -s/p Ultrasound-guided paracentesis yielding 5.1L  -Patient has refused diuretics in the past  Chronic anemia and thrombocytopenia -Secondary to cirrhosis -Continue to monitor CBC  Elevated blood pressure, no history of HTN -suspect secondary to pain -Continue hydralazine PRN  Metastatic adenocarcinoma -CT abdomen pelvis also noted liver lesions, lung base pulmonary nodules and abdominal lymphadenopathy -Oncology and radiation oncology consulted and appreciated -patient does not wish to pursue chemotherapy treatment however is open to radiation treatment -radiation oncology consulted and appreciated, plan for radiation to start on 6/3   Moderate malnutrition -nutrition consulted, continue supplements  Goals of care -Discussed with patient and wife at bedside- options such as hospice, comfort care, etc -Palliative care consulted and appreciated   DVT Prophylaxis  SCDs  Code Status: DNR  Family Communication: None at bedside  Disposition Plan: Admitted.  Pending improvement in pain control, as well as palliative radiation on 05/08/2018  Consultants Oncology Radiation oncology Palliative care  Procedures  US guided  paracentesis  Antibiotics   Anti-infectives (From  admission, onward)   Start     Dose/Rate Route Frequency Ordered Stop   05/03/18 0600  cefTRIAXone (ROCEPHIN) 2 g in sodium chloride 0.9 % 100 mL IVPB     2 g 200 mL/hr over 30 Minutes Intravenous Every 24 hours 05/03/18 0455        Subjective:   Adrian Compton seen and examined today.  Feels pain has improved.  Denies current chest pain, shortness of breath, abdominal pain, nausea or vomiting, diarrhea or constipation.  Objective:   Vitals:   05/06/18 0815 05/06/18 1157 05/06/18 1314 05/06/18 1333  BP:   (!) 168/95 125/77  Pulse:   93 (!) 101  Resp: 18 12 16    Temp:   97.9 F (36.6 C)   TempSrc:   Oral   SpO2: 97% 96% 100% 93%  Weight:      Height:        Intake/Output Summary (Last 24 hours) at 05/06/2018 1428 Last data filed at 05/06/2018 0900 Gross per 24 hour  Intake 320 ml  Output -  Net 320 ml   Filed Weights   05/02/18 2109 05/05/18 0444  Weight: 98.9 kg (218 lb) 94.8 kg (208 lb 15.9 oz)   Exam  General: Well developed, chronically ill-appearing, no apparent distress, somnolent  HEENT: NCAT, mucous membranes moist.   Neck: Supple  Cardiovascular: S1 S2 auscultated, no rubs, murmurs or gallops. Regular rate and rhythm.  Respiratory: Clear to auscultation bilaterally with equal chest rise  Abdomen: Soft, nontender, nondistended, + bowel sounds  Extremities: warm dry without cyanosis clubbing or edema  Neuro: AAOx3, nonfocal, somnolent however arousable  Skin: Without rashes exudates or nodules  Psych: appropriate mood and affect  Data Reviewed: I have personally reviewed following labs and imaging studies  CBC: Recent Labs  Lab 05/02/18 2224 05/03/18 0604 05/04/18 0617 05/05/18 0630  WBC 6.3 6.3 9.4 8.9  NEUTROABS 5.2 5.2  --   --   HGB 12.6* 12.6* 13.3 13.5  HCT 36.8* 36.2* 38.7* 38.8*  MCV 87.0 88.1 87.0 87.6  PLT 50* 45* 51* 47*   Basic Metabolic Panel: Recent Labs  Lab  05/02/18 2224 05/03/18 0604 05/04/18 0617 05/05/18 0630  NA 138 137 138 137  K 4.5 4.2 3.8 4.1  CL 101 101 103 99*  CO2 29 25 25 31   GLUCOSE 120* 117* 123* 112*  BUN 19 19 25* 25*  CREATININE 0.94 0.86 0.80 0.91  CALCIUM 10.1 9.8 9.9 9.9   GFR: Estimated Creatinine Clearance: 101.9 mL/min (by C-G formula based on SCr of 0.91 mg/dL). Liver Function Tests: Recent Labs  Lab 05/02/18 2224 05/03/18 0604  AST 31 29  ALT 17 16*  ALKPHOS 175* 169*  BILITOT 2.2* 2.2*  PROT 7.7 7.3  ALBUMIN 3.9 3.7   Recent Labs  Lab 05/02/18 2224  LIPASE 26   No results for input(s): AMMONIA in the last 168 hours. Coagulation Profile: No results for input(s): INR, PROTIME in the last 168 hours. Cardiac Enzymes: Recent Labs  Lab 05/03/18 0604 05/03/18 1356 05/03/18 1927  TROPONINI <0.03 <0.03 <0.03   BNP (last 3 results) No results for input(s): PROBNP in the last 8760 hours. HbA1C: No results for input(s): HGBA1C in the last 72 hours. CBG: No results for input(s): GLUCAP in the last 168 hours. Lipid Profile: No results for input(s): CHOL, HDL, LDLCALC, TRIG, CHOLHDL, LDLDIRECT in the last 72 hours. Thyroid Function Tests: No results for input(s): TSH, T4TOTAL, FREET4, T3FREE, THYROIDAB in the last 72 hours.  Anemia Panel: No results for input(s): VITAMINB12, FOLATE, FERRITIN, TIBC, IRON, RETICCTPCT in the last 72 hours. Urine analysis:    Component Value Date/Time   COLORURINE AMBER (A) 05/24/2015 1239   APPEARANCEUR CLEAR 05/24/2015 1239   LABSPEC 1.015 05/24/2015 1239   PHURINE 5.5 05/24/2015 1239   GLUCOSEU NEGATIVE 05/24/2015 1239   HGBUR NEGATIVE 05/24/2015 1239   BILIRUBINUR NEGATIVE 05/24/2015 1239   KETONESUR NEGATIVE 05/24/2015 1239   PROTEINUR NEGATIVE 05/24/2015 1239   UROBILINOGEN 1.0 05/24/2015 1239   NITRITE NEGATIVE 05/24/2015 1239   LEUKOCYTESUR NEGATIVE 05/24/2015 1239   Sepsis Labs: @LABRCNTIP (procalcitonin:4,lacticidven:4)  ) Recent Results (from  the past 240 hour(s))  Culture, body fluid-bottle     Status: None (Preliminary result)   Collection Time: 05/03/18 12:39 PM  Result Value Ref Range Status   Specimen Description PERITONEAL  Final   Special Requests NONE  Final   Culture   Final    NO GROWTH 2 DAYS Performed at [facility], [place] [address], [place], [place] [address]    Report Status PENDING  Incomplete  Gram stain     Status: None   Collection Time: 05/03/18 12:39 PM  Result Value Ref Range Status   Specimen Description PERITONEAL  Final   Special Requests NONE  Final   Gram Stain   Final    RARE WBC PRESENT, PREDOMINANTLY MONONUCLEAR NO ORGANISMS SEEN Performed at [facility], [address], [place], [place] [address]    Report Status 05/04/2018 FINAL  Final      Radiology Studies: No results found.   Scheduled Meds: . dexamethasone  4 mg Oral Daily  . famotidine  20 mg Oral BID  . feeding supplement (ENSURE ENLIVE)  237 mL Oral BID BM  . HYDROmorphone   Intravenous Q4H  . LORazepam  0.5 mg Intravenous Q8H  . senna-docusate  1 tablet Oral QHS  . sertraline  100 mg Oral Daily   Continuous Infusions: . cefTRIAXone (ROCEPHIN)  IV Stopped (05/06/18 0837)     LOS: 3 days   Time Spent in minutes   45 minutes  Hallis Meditz D.O. on 05/06/2018 at 2:28 PM  Between 7am to 7pm - Pager - [phone]  After 7pm go to [email or web address] - password [email or web address]  And look for the night coverage person covering for me after hours  [facility] Office  [phone]

## 2018-05-07 NOTE — Progress Notes (Signed)
PROGRESS NOTE    Adrian Compton  ZOX:[id number] DOB: Jan 21, 1956 DOA: 05/02/2018 PCP: Dineen Kid, MD   Brief Narrative:  HPI On 05/03/2018 by Dr. Gean Birchwood Adrian Compton is a 62 y.o. 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 Julien Nordmann oncologist and patient has appointment on June 3 presents to the ER because of worsening low back pain with increasing abdominal distention.  During the last month stay patient was advised about taking diuretics which patient declined.  Denies any nausea vomiting but has very poor appetite has been also having some chest pain which has been constant over the last few days with some shortness of breath mainly due to the abdominal distention and back pain.  Denies any incontinence of urine or bowel.  Interim history  Found to have mets to the spine causing compression fracture. Oncology and radiation oncology consulted, planning on palliative radiation. Palliative care consulted.  Assessment & Plan   Uncontrolled Back pain secondary to L3 compression fracture -Patient recently was diagnosed with metastatic adenocarcinoma was referred to oncology. -CT abdomen pelvis showed multiple lucent lesions present throughout the visible spine increased in size from 03/29/2018 compatible with osseous metastasis.  New L3 superior endplate pathological fracture without loss of vertebral body height. -Only able to complete MRI cervical spine without contrast secondary to pain. Does not feel that he can complete the thoracic and lumber MRI today. -MRI cervical spine: 62mm lesion left C7 to call which was hyper metabolic on PET and most consistent with bony metastatic disease. -Discussed with Dr. Julien Nordmann, however, he is referring patient to  more of an GI onc given that patient's primary is not lung.  -Dr. Lindi Adie consulted and appreciated, recommended radiation oncology for palliative radiation- to start on Monday, 6/3. -Palliative care consulted for pain management -Continue dexamethasone and ativan  -Currently on Dilaudid PCA pump, and continues to complain of 8/10 pain. When reviewed, patient is not using PRN doses -Palliative care recommended transitioning to patient to fentanyl patch 51mcg q72hr with hydromorphone 2-4mg  q4hrn after radiation therapy has been started. Discontinue PCA 6-12 hours after starting fentanyl.  Abdominal distention secondary to ascites with history of cirrhosis and hepatitis C -Patient placed empirically on ceftriaxone -CT abdomen pelvis showed cirrhotic liver with stigmata of severe portal hypertension.  Splenomegaly, approximately 2330cc. Moderate volume of ascites.  -s/p Ultrasound-guided paracentesis yielding 5.1L  -Patient has refused diuretics in the past  Chronic anemia and thrombocytopenia -Secondary to cirrhosis -Continue to monitor CBC  Elevated blood pressure, no history of HTN -suspect secondary to pain -Continue hydralazine PRN  Metastatic adenocarcinoma -CT abdomen pelvis also noted liver lesions, lung base pulmonary nodules and abdominal lymphadenopathy -Oncology and radiation oncology consulted and appreciated -patient does not wish to pursue chemotherapy treatment however is open to radiation treatment -radiation oncology consulted and appreciated, plan for radiation to start on 6/3   Moderate malnutrition -nutrition consulted, continue supplements  Goals of care -Discussed with patient and wife at bedside- options such as hospice, comfort care, etc -Palliative care consulted and appreciated   DVT Prophylaxis  SCDs  Code Status: DNR  Family Communication: None at bedside  Disposition Plan: Admitted.  Pending improvement in pain control, as well as palliative radiation  on 05/08/2018  Consultants Oncology Radiation oncology Palliative care  Procedures  US guided paracentesis  Antibiotics   Anti-infectives (From admission, onward)   Start     Dose/Rate Route Frequency Ordered Stop   05/03/18 0600  cefTRIAXone (ROCEPHIN) 2 g in sodium chloride 0.9 % 100 mL IVPB     2 g 200 mL/hr over 30 Minutes Intravenous Every 24 hours 05/03/18 0455        Subjective:   Adrian Compton seen and examined today.  Feels pain has mildly improved. Feels abdomen may be getting more distended. Was able to eat more. Denies chest pain, shortness of breath, nausea, vomiting, diarrhea, constipation.   Objective:   Vitals:   05/07/18 0142 05/07/18 0524 05/07/18 0735 05/07/18 0849  BP: (!) 149/83 (!) 145/88  (!) 148/86  Pulse:  95  (!) 107  Resp:  16 10 16   Temp:  97.8 F (36.6 C)  98.1 F (36.7 C)  TempSrc:  Oral    SpO2:  97% 94% 96%  Weight:  93.9 kg (207 lb 1.6 oz)    Height:        Intake/Output Summary (Last 24 hours) at 05/07/2018 0957 Last data filed at 05/07/2018 0530 Gross per 24 hour  Intake 240 ml  Output -  Net 240 ml   Filed Weights   05/02/18 2109 05/05/18 0444 05/07/18 0524  Weight: 98.9 kg (218 lb) 94.8 kg (208 lb 15.9 oz) 93.9 kg (207 lb 1.6 oz)   Exam  General: Well developed, chronically ill appearing, NAD  HEENT: NCAT, mucous membranes moist.   Neck: Supple  Cardiovascular: S1 S2 auscultated, no rubs, murmurs or gallops. Regular rate and rhythm.  Respiratory: Clear to auscultation bilaterally with equal chest rise  Abdomen: Soft, nontender, mildy distended, + bowel sounds  Extremities: warm dry without cyanosis clubbing or edema  Neuro: AAOx3, nonfocal  Psych: Pleasant, appropriate mood and affect  Data Reviewed: I have personally reviewed following labs and imaging studies  CBC: Recent Labs  Lab 05/02/18 2224 05/03/18 0604 05/04/18 0617 05/05/18 0630  WBC 6.3 6.3 9.4 8.9  NEUTROABS 5.2 5.2  --   --   HGB 12.6* 12.6*  13.3 13.5  HCT 36.8* 36.2* 38.7* 38.8*  MCV 87.0 88.1 87.0 87.6  PLT 50* 45* 51* 47*   Basic Metabolic Panel: Recent Labs  Lab 05/02/18 2224 05/03/18 0604 05/04/18 0617 05/05/18 0630  NA 138 137 138 137  K 4.5 4.2 3.8 4.1  CL 101 101 103 99*  CO2 29 25 25 31   GLUCOSE 120* 117* 123* 112*  BUN 19 19 25* 25*  CREATININE 0.94 0.86 0.80 0.91  CALCIUM 10.1 9.8 9.9 9.9   GFR: Estimated Creatinine Clearance: 101.9 mL/min (by C-G formula based on SCr of 0.91 mg/dL). Liver Function Tests: Recent Labs  Lab 05/02/18 2224 05/03/18 0604  AST 31 29  ALT 17 16*  ALKPHOS 175* 169*  BILITOT 2.2* 2.2*  PROT 7.7 7.3  ALBUMIN 3.9 3.7   Recent Labs  Lab 05/02/18 2224  LIPASE 26   No results for input(s): AMMONIA in the last 168 hours. Coagulation Profile: No results for input(s): INR, PROTIME in the last 168 hours. Cardiac Enzymes: Recent Labs  Lab 05/03/18 0604 05/03/18 1356 05/03/18 1927  TROPONINI <0.03 <0.03 <0.03   BNP (last 3 results) No results for input(s): PROBNP in the last 8760 hours. HbA1C: No results for input(s): HGBA1C in the last 72 hours. CBG: No results for input(s): GLUCAP in the last 168 hours. Lipid Profile: No results for input(s): CHOL,  HDL, LDLCALC, TRIG, CHOLHDL, LDLDIRECT in the last 72 hours. Thyroid Function Tests: No results for input(s): TSH, T4TOTAL, FREET4, T3FREE, THYROIDAB in the last 72 hours. Anemia Panel: No results for input(s): VITAMINB12, FOLATE, FERRITIN, TIBC, IRON, RETICCTPCT in the last 72 hours. Urine analysis:    Component Value Date/Time   COLORURINE AMBER (A) 05/24/2015 1239   APPEARANCEUR CLEAR 05/24/2015 1239   LABSPEC 1.015 05/24/2015 1239   PHURINE 5.5 05/24/2015 1239   GLUCOSEU NEGATIVE 05/24/2015 1239   HGBUR NEGATIVE 05/24/2015 1239   BILIRUBINUR NEGATIVE 05/24/2015 1239   KETONESUR NEGATIVE 05/24/2015 1239   PROTEINUR NEGATIVE 05/24/2015 1239   UROBILINOGEN 1.0 05/24/2015 1239   NITRITE NEGATIVE 05/24/2015  1239   LEUKOCYTESUR NEGATIVE 05/24/2015 1239   Sepsis Labs: @LABRCNTIP (procalcitonin:4,lacticidven:4)  ) Recent Results (from the past 240 hour(s))  Culture, body fluid-bottle     Status: None (Preliminary result)   Collection Time: 05/03/18 12:39 PM  Result Value Ref Range Status   Specimen Description PERITONEAL  Final   Special Requests NONE  Final   Culture   Final    NO GROWTH 3 DAYS Performed at [facility], [place] [address], [place], [place] [address]    Report Status PENDING  Incomplete  Gram stain     Status: None   Collection Time: 05/03/18 12:39 PM  Result Value Ref Range Status   Specimen Description PERITONEAL  Final   Special Requests NONE  Final   Gram Stain   Final    RARE WBC PRESENT, PREDOMINANTLY MONONUCLEAR NO ORGANISMS SEEN Performed at [facility], [address] [place], [place] [address]    Report Status 05/04/2018 FINAL  Final      Radiology Studies: No results found.   Scheduled Meds: . dexamethasone  4 mg Oral Daily  . famotidine  20 mg Oral BID  . feeding supplement (ENSURE ENLIVE)  237 mL Oral BID BM  . HYDROmorphone   Intravenous Q4H  . LORazepam  0.5 mg Intravenous Q8H  . senna-docusate  1 tablet Oral QHS  . sertraline  100 mg Oral Daily   Continuous Infusions: . cefTRIAXone (ROCEPHIN)  IV 2 g (05/07/18 0610)     LOS: 4 days   Time Spent in minutes   30 minutes  Remell Giaimo D.O. on 05/07/2018 at 9:57 AM  Between 7am to 7pm - Pager - [phone]  After 7pm go to [email or web address] - password [email or web address]  And look for the night coverage person covering for me after hours  [facility] Office  [phone]

## 2018-05-07 NOTE — Progress Notes (Signed)
Daily Progress Note   Patient Name: Adrian Compton       Date: 05/07/2018 DOB: Dec 11, 1955  Age: 62 y.o. MRN#: [id number] Attending Physician: Cristal Ford, DO Primary Care Physician: Dineen Kid, MD Admit Date: 05/02/2018  Reason for Consultation/Follow-up: Establishing goals of care  Subjective: Patient awake. Rates pain 7-8/10. Remains on hydromorphone PCA. However, has infrequently used prn dosing in past 24 hours.   Length of Stay: 4  Current Medications: Scheduled Meds:  . dexamethasone  4 mg Oral Daily  . famotidine  20 mg Oral BID  . feeding supplement (ENSURE ENLIVE)  237 mL Oral BID BM  . HYDROmorphone   Intravenous Q4H  . LORazepam  0.5 mg Intravenous Q8H  . senna-docusate  1 tablet Oral QHS  . sertraline  100 mg Oral Daily    Continuous Infusions: . cefTRIAXone (ROCEPHIN)  IV 2 g (05/07/18 0610)    PRN Meds: acetaminophen **OR** acetaminophen, albuterol, diphenhydrAMINE **OR** diphenhydrAMINE, gi cocktail, hydrALAZINE, LORazepam, naloxone **AND** sodium chloride flush, ondansetron **OR** ondansetron (ZOFRAN) IV  Physical Exam  Constitutional: He is oriented to person, place, and time. He appears well-developed and well-nourished.  Cardiovascular: Normal rate and regular rhythm.  Pulmonary/Chest: Effort normal and breath sounds normal.  Abdominal: Bowel sounds are normal.  Neurological: He is alert and oriented to person, place, and time.  Skin: Skin is warm and dry.            Vital Signs: BP (!) 145/88 (BP Location: Left Arm)   Pulse 95   Temp 97.8 F (36.6 C) (Oral)   Resp 10   Ht 6\' 3"  (1.905 m)   Wt 93.9 kg (207 lb 1.6 oz)   SpO2 94%   BMI 25.89 kg/m  SpO2: SpO2: 94 % O2 Device: O2 Device: Room Air O2 Flow Rate: O2 Flow Rate (L/min): 0  L/min  Intake/output summary:   Intake/Output Summary (Last 24 hours) at 05/07/2018 0819 Last data filed at 05/07/2018 0530 Gross per 24 hour  Intake 360 ml  Output -  Net 360 ml   LBM: Last BM Date: 05/05/18 Baseline Weight: Weight: 98.9 kg (218 lb) Most recent weight: Weight: 93.9 kg (207 lb 1.6 oz)       Palliative Assessment/Data:      Patient Active Problem List   Diagnosis Date Noted  .  Palliative care encounter   . Malnutrition of moderate degree 05/05/2018  . Bone metastasis ([place]) 05/04/2018  . Low back pain 05/03/2018  . Closed compression fracture of L3 lumbar vertebra, initial encounter ([place]) 05/03/2018  . Elevated blood pressure reading 05/03/2018  . Pathological compression fracture of spine ([place]) 05/03/2018  . Pulmonary nodule 04/13/2018  . Chronic low back pain 03/28/2018  . Mediastinal mass 03/28/2018  . Hepatitis C (treated) 03/28/2018  . Cirrhosis of liver due to hepatitis C 03/28/2018  . Presence of right artificial knee joint 03/28/2018  . Unintended weight loss 03/28/2018  . Pleuritic chest pain 03/28/2018  . Hoarseness 03/28/2018  . Altered mental status   . Hepatic encephalopathy ([place]) 05/24/2015  . Chronic pain 05/24/2015  . Ascites 01/24/2013  . Hypotension, iatrogenic 01/24/2013  . Leukocytosis 01/24/2013  . Dilutional hyponatremia due to cirrhosis 01/24/2013  . Umbilical hernia 54/62/7035  . Azotemia 01/24/2013  . Anemia in chronic illness 01/24/2013  . 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 Presumed SBP (spontaneous bacterial peritonitis) 01/24/2013  . Abdominal pain 01/23/2013  . Weakness 01/23/2013  . Hepatic cirrhosis due to chronic hepatitis C infection ([place]) 01/23/2013    Palliative Care Assessment & Plan   Patient Profile: 62 y.o. male admitted on 05/02/2018 from home with worsening lower back pain and increased abdominal distention. He has a past medical history significant for hepatitis C with cirrhosis of  liver, chronic low back pain, ascites (requiring frequent paracentesis), tobacco smoker, and most recently diagnosed metastatic adenocarcinoma of the lung. Patient had CT of the abdomen and pelvis and CT lumbar spine which shows new compression fracture of the L3 with multiple metastatic lesions in the spine. Also has moderate ascites with portal hypertension. Since admission patient is experiencing uncontrolled pain. Palliative Medicine consulted for goals of care and to assist with pain   Assessment: Pain seems stable on PCA. In past 24 hours, patient has had approximately 12.5mg  of IV hydromorphone. This suggests that he has only used the prn dose once in past 24 hours. He is pending XRT tomorrow and I hope that once radiation treatments start that his opioid requirements would decrease.   Recommendations/Plan:  Continue supportive care  Pending XRT  Consider hospice at home  Currently, patient's total daily oral morphine equivalence is approximately 250mg . Patient will need to be rotated to oral opioids, although I would recommend starting XRT first to see if opioid requirements change. If opioid use is stable, I would recommend starting transdermal fentanyl at 69mcg Q72hours. This would account for a 40% reduction for incomplete cross tolerance. I would then start him on hydromorphone tablets 2-4mg  Q4H prn for breakthrough pain. Recommend discontinuation of continuous PCA 6-12 hours after fentanyl patch is applied.    Code Status: DNR   Thank you for allowing the Palliative Medicine Team to assist in the care of this patient.   Time In: 0715 Time Out: 0745 Total Time 30 minutes Prolonged Time Billed NO      Greater than 50%  of this time was spent counseling and coordinating care related to the above assessment and plan.  Irean Hong, NP  Please contact Palliative Medicine Team phone at [phone] for questions and concerns.

## 2018-05-08 ENCOUNTER — Ambulatory Visit: Admit: 2018-05-08 | Payer: Medicare Other | Admitting: Radiation Oncology

## 2018-05-08 ENCOUNTER — Other Ambulatory Visit: Payer: Self-pay | Admitting: Medical Oncology

## 2018-05-08 ENCOUNTER — Inpatient Hospital Stay: Payer: Medicare Other | Admitting: Internal Medicine

## 2018-05-08 ENCOUNTER — Inpatient Hospital Stay: Payer: Medicare Other

## 2018-05-08 LAB — BASIC METABOLIC PANEL
ANION GAP: 10 (ref 5–15)
BUN: 25 mg/dL — ABNORMAL HIGH (ref 6–20)
CALCIUM: 8.3 mg/dL — AB (ref 8.9–10.3)
CHLORIDE: 98 mmol/L — AB (ref 101–111)
CO2: 24 mmol/L (ref 22–32)
Creatinine, Ser: 0.87 mg/dL (ref 0.61–1.24)
GFR calc non Af Amer: >60 mL/min (ref >60)
Glucose, Bld: 96 mg/dL (ref 65–99)
Potassium: 4.1 mmol/L (ref 3.5–5.1)
SODIUM: 132 mmol/L — AB (ref 135–145)

## 2018-05-08 LAB — CULTURE, BODY FLUID W GRAM STAIN -BOTTLE

## 2018-05-08 LAB — CBC
HEMATOCRIT: 38.1 % — AB (ref 39.0–52.0)
HEMOGLOBIN: 13.3 g/dL (ref 13.0–17.0)
MCH: 30 pg (ref 26.0–34.0)
MCHC: 34.9 g/dL (ref 30.0–36.0)
MCV: 86 fL (ref 78.0–100.0)
Platelets: 42 10*3/uL — ABNORMAL LOW (ref 150–400)
RBC: 4.43 MIL/uL (ref 4.22–5.81)
RDW: 16 % — AB (ref 11.5–15.5)
WBC: 11.8 10*3/uL — AB (ref 4.0–10.5)

## 2018-05-08 LAB — CULTURE, BODY FLUID-BOTTLE: CULTURE: NO GROWTH

## 2018-05-08 MED ORDER — DEXAMETHASONE 4 MG PO TABS
4.0000 mg | ORAL_TABLET | ORAL | Status: AC
  Administered 2018-05-08: 4 mg via ORAL
  Filled 2018-05-08: qty 1

## 2018-05-08 MED ORDER — HYDROMORPHONE 1 MG/ML IV SOLN
INTRAVENOUS | Status: DC
  Administered 2018-05-08: 4.3 mg via INTRAVENOUS
  Administered 2018-05-09: 4.24 mg via INTRAVENOUS
  Administered 2018-05-09: 5.93 mg via INTRAVENOUS
  Administered 2018-05-09: 12:00:00 via INTRAVENOUS
  Administered 2018-05-09: 3.99 mg via INTRAVENOUS
  Administered 2018-05-09: 7.98 mg via INTRAVENOUS
  Administered 2018-05-10: 6.41 mg via INTRAVENOUS
  Administered 2018-05-10: 5.12 mg via INTRAVENOUS
  Administered 2018-05-10: 2.51 mg via INTRAVENOUS
  Administered 2018-05-11: 10:00:00 via INTRAVENOUS
  Filled 2018-05-08 (×3): qty 25

## 2018-05-08 MED ORDER — DEXAMETHASONE 4 MG PO TABS
8.0000 mg | ORAL_TABLET | Freq: Every day | ORAL | Status: DC
  Administered 2018-05-09 – 2018-05-11 (×3): 8 mg via ORAL
  Filled 2018-05-08 (×3): qty 2

## 2018-05-08 MED ORDER — KETOROLAC TROMETHAMINE 30 MG/ML IJ SOLN
30.0000 mg | Freq: Once | INTRAMUSCULAR | Status: AC
  Administered 2018-05-08: 30 mg via INTRAVENOUS
  Filled 2018-05-08: qty 1

## 2018-05-08 MED ORDER — DEXAMETHASONE 4 MG PO TABS: 4.0000 mg | ORAL_TABLET | Freq: Once | ORAL | Status: DC

## 2018-05-08 MED ORDER — HYDROMORPHONE 1 MG/ML IV SOLN: INTRAVENOUS | Status: DC

## 2018-05-08 NOTE — Progress Notes (Signed)
Followed up with patient-good conversation about goals, life review and his current pain control. Severe pain this AM due to incorrect PCA - no basal started. This has been corrected and he is much better. Pain was so bad he could not do radiation. Now that patient has better control he does not want to pursue radiation. Added Toradol and will start COX-2 for adjuvant bone and liver capsular pain. Patient did not tolerate Fentanyl patch- "not working".   Goal is to go home with hospice care- given this I think we should consider placing a PICC line and sending him with a CADD pump PCA. Will need to see if dilaudid is available at home due to national shortage. For now I will continue to titrate his PCA for pain control- but he is much improved now.  Plan for PICC, CADD pump and home with hospice once set up.  Confirmed DNR and comfort care goals. Removed ETCO2 and Cont O2 monitoring.   Lane Hacker, DO Palliative Medicine [phone]  Time: 35 min Greater than 50%  of this time was spent counseling and coordinating care related to the above assessment and plan.

## 2018-05-08 NOTE — Progress Notes (Signed)
PROGRESS NOTE    [name]  KYH:[id number] DOB: Feb 17, 1956 DOA: 05/02/2018 PCP: Dineen Kid, MD   Brief Narrative:  HPI On 05/03/2018 by Dr. Gean Birchwood Adrian Compton is a 62 y.o. 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 Julien Nordmann oncologist and patient has appointment on June 3 presents to the ER because of worsening low back pain with increasing abdominal distention.  During the last month stay patient was advised about taking diuretics which patient declined.  Denies any nausea vomiting but has very poor appetite has been also having some chest pain which has been constant over the last few days with some shortness of breath mainly due to the abdominal distention and back pain.  Denies any incontinence of urine or bowel.  Interim history  Found to have mets to the spine causing compression fracture. Oncology and radiation oncology consulted, planning on palliative radiation. Palliative care consulted.  Assessment & Plan   Uncontrolled Back pain secondary to L3 compression fracture -Patient recently was diagnosed with metastatic adenocarcinoma was referred to oncology. -CT abdomen pelvis showed multiple lucent lesions present throughout the visible spine increased in size from 03/29/2018 compatible with osseous metastasis.  New L3 superior endplate pathological fracture without loss of vertebral body height. -Only able to complete MRI cervical spine without contrast secondary to pain. Does not feel that he can complete the thoracic and lumber MRI today. -MRI cervical spine: 49mm lesion left C7 to call which was hyper metabolic on PET and most consistent with bony metastatic disease. -Discussed with Dr. Julien Nordmann, however, he is referring patient to  more of an GI onc given that patient's primary is not lung.  -Dr. Lindi Adie consulted and appreciated, recommended radiation oncology for palliative radiation- to start on Monday, 6/3. -Palliative care consulted for pain management -Continue dexamethasone and ativan  -Currently on Dilaudid PCA pump, and continues to complain of 7-8/10 pain. Discussed using PRN doses with patient. -Palliative care recommended transitioning to patient to fentanyl patch 72mcg q72hr with hydromorphone 2-4mg  q4hrn after radiation therapy has been started. Discontinue PCA 6-12 hours after starting fentanyl.  Abdominal distention secondary to ascites with history of cirrhosis and hepatitis C -Patient placed empirically on ceftriaxone -CT abdomen pelvis showed cirrhotic liver with stigmata of severe portal hypertension.  Splenomegaly, approximately 2330cc. Moderate volume of ascites.  -s/p Ultrasound-guided paracentesis yielding 5.1L  -Patient has refused diuretics in the past  Chronic anemia and thrombocytopenia -Secondary to cirrhosis -currently stable -Continue to monitor CBC  Elevated blood pressure, no history of HTN -suspect secondary to pain -Continue hydralazine PRN  Metastatic adenocarcinoma -CT abdomen pelvis also noted liver lesions, lung base pulmonary nodules and abdominal lymphadenopathy -Oncology and radiation oncology consulted and appreciated -patient does not wish to pursue chemotherapy treatment however is open to radiation treatment -radiation oncology consulted and appreciated, plan for radiation to start today 6/3   Moderate malnutrition -nutrition consulted, continue supplements  Goals of care -Discussed with patient and wife at bedside- options such as hospice, comfort care, etc -Palliative care consulted and appreciated   DVT Prophylaxis  SCDs  Code Status: DNR  Family Communication: None at bedside  Disposition Plan: Admitted.  Pending improvement in pain control, as well as  palliative radiation today 05/08/2018  Consultants Oncology Radiation oncology Palliative care  Procedures  US guided paracentesis  Antibiotics   Anti-infectives (From admission, onward)   Start     Dose/Rate Route Frequency Ordered Stop   05/03/18 0600  cefTRIAXone (ROCEPHIN) 2 g in sodium chloride 0.9 % 100 mL IVPB     2 g 200 mL/hr over 30 Minutes Intravenous Every 24 hours 05/03/18 0455        Subjective:   Adrian Compton seen and examined today.  Feels pain is 7-8 out of 10.  States he wants to be out of pain.  Denies current chest pain, shortness of breath, abdominal pain, nausea or vomiting, diarrhea or constipation.  Objective:   Vitals:   05/08/18 0045 05/08/18 0407 05/08/18 0445 05/08/18 0756  BP:   (!) 153/87   Pulse:   98   Resp: 14 17 15 16   Temp:   98 F (36.7 C)   TempSrc:   Oral   SpO2:  96% 100% 97%  Weight:   94.1 kg (207 lb 7.3 oz)   Height:       No intake or output data in the 24 hours ending 05/08/18 1102 Filed Weights   05/05/18 0444 05/07/18 0524 05/08/18 0445  Weight: 94.8 kg (208 lb 15.9 oz) 93.9 kg (207 lb 1.6 oz) 94.1 kg (207 lb 7.3 oz)   Exam  General: Well developed, chronically ill-appearing, no apparent distress  HEENT: NCAT, mucous membranes moist.   Neck: Supple  Cardiovascular: S1 S2 auscultated, no rubs, murmurs or gallops. Regular rate and rhythm.  Respiratory: Clear to auscultation bilaterally with equal chest rise  Abdomen: Soft, nontender, distended, + bowel sounds  Extremities: warm dry without cyanosis clubbing or edema  Neuro: Awake and alert, only wants to speak about his pain, nonfocal  Psych: Tearful, anxious  Data Reviewed: I have personally reviewed following labs and imaging studies  CBC: Recent Labs  Lab 05/02/18 2224 05/03/18 0604 05/04/18 0617 05/05/18 0630 05/08/18 0550  WBC 6.3 6.3 9.4 8.9 11.8*  NEUTROABS 5.2 5.2  --   --   --   HGB 12.6* 12.6* 13.3 13.5 13.3  HCT 36.8* 36.2* 38.7* 38.8*  38.1*  MCV 87.0 88.1 87.0 87.6 86.0  PLT 50* 45* 51* 47* 42*   Basic Metabolic Panel: Recent Labs  Lab 05/02/18 2224 05/03/18 0604 05/04/18 0617 05/05/18 0630 05/08/18 0550  NA 138 137 138 137 132*  K 4.5 4.2 3.8 4.1 4.1  CL 101 101 103 99* 98*  CO2 29 25 25 31 24   GLUCOSE 120* 117* 123* 112* 96  BUN 19 19 25* 25* 25*  CREATININE 0.94 0.86 0.80 0.91 0.87  CALCIUM 10.1 9.8 9.9 9.9 8.3*   GFR: Estimated Creatinine Clearance: 106.6 mL/min (by C-G formula based on SCr of 0.87 mg/dL). Liver Function Tests: Recent Labs  Lab 05/02/18 2224 05/03/18 0604  AST 31 29  ALT 17 16*  ALKPHOS 175* 169*  BILITOT 2.2* 2.2*  PROT 7.7 7.3  ALBUMIN 3.9 3.7   Recent Labs  Lab 05/02/18 2224  LIPASE 26   No results for input(s): AMMONIA in the last 168 hours. Coagulation Profile: No results for input(s): INR, PROTIME in the last 168 hours. Cardiac Enzymes: Recent Labs  Lab 05/03/18 0604 05/03/18 1356 05/03/18 1927  TROPONINI <0.03 <0.03 <0.03   BNP (last 3 results) No results for input(s): PROBNP in the last 8760 hours. HbA1C: No results for input(s): HGBA1C in the last 72 hours. CBG: No results for input(s): GLUCAP in the last 168 hours. Lipid  Profile: No results for input(s): CHOL, HDL, LDLCALC, TRIG, CHOLHDL, LDLDIRECT in the last 72 hours. Thyroid Function Tests: No results for input(s): TSH, T4TOTAL, FREET4, T3FREE, THYROIDAB in the last 72 hours. Anemia Panel: No results for input(s): VITAMINB12, FOLATE, FERRITIN, TIBC, IRON, RETICCTPCT in the last 72 hours. Urine analysis:    Component Value Date/Time   COLORURINE AMBER (A) 05/24/2015 1239   APPEARANCEUR CLEAR 05/24/2015 1239   LABSPEC 1.015 05/24/2015 1239   PHURINE 5.5 05/24/2015 1239   GLUCOSEU NEGATIVE 05/24/2015 1239   HGBUR NEGATIVE 05/24/2015 1239   BILIRUBINUR NEGATIVE 05/24/2015 1239   KETONESUR NEGATIVE 05/24/2015 1239   PROTEINUR NEGATIVE 05/24/2015 1239   UROBILINOGEN 1.0 05/24/2015 1239    NITRITE NEGATIVE 05/24/2015 1239   LEUKOCYTESUR NEGATIVE 05/24/2015 1239   Sepsis Labs: @LABRCNTIP (procalcitonin:4,lacticidven:4)  ) Recent Results (from the past 240 hour(s))  Culture, body fluid-bottle     Status: None (Preliminary result)   Collection Time: 05/03/18 12:39 PM  Result Value Ref Range Status   Specimen Description PERITONEAL  Final   Special Requests NONE  Final   Culture   Final    NO GROWTH 4 DAYS Performed at [facility], [place] [address] [place], [place] [address]    Report Status PENDING  Incomplete  Gram stain     Status: None   Collection Time: 05/03/18 12:39 PM  Result Value Ref Range Status   Specimen Description PERITONEAL  Final   Special Requests NONE  Final   Gram Stain   Final    RARE WBC PRESENT, PREDOMINANTLY MONONUCLEAR NO ORGANISMS SEEN Performed at [facility], [address], [place], [place] [address]    Report Status 05/04/2018 FINAL  Final      Radiology Studies: No results found.   Scheduled Meds: . dexamethasone  4 mg Oral Daily  . famotidine  20 mg Oral BID  . feeding supplement (ENSURE ENLIVE)  237 mL Oral BID BM  . HYDROmorphone   Intravenous Q4H  . LORazepam  0.5 mg Intravenous Q8H  . senna-docusate  1 tablet Oral QHS  . sertraline  100 mg Oral Daily   Continuous Infusions: . cefTRIAXone (ROCEPHIN)  IV Stopped (05/08/18 0923)     LOS: 5 days   Time Spent in minutes   30 minutes  Arieon Scalzo D.O. on 05/08/2018 at 11:02 AM  Between 7am to 7pm - Pager - [phone]  After 7pm go to [email or web address] - password [email or web address]  And look for the night coverage person covering for me after hours  [facility] Office  [phone]

## 2018-05-09 ENCOUNTER — Inpatient Hospital Stay: Payer: Self-pay

## 2018-05-09 ENCOUNTER — Ambulatory Visit: Payer: Medicare Other | Admitting: Radiation Oncology

## 2018-05-09 MED ORDER — POLYETHYLENE GLYCOL 3350 17 G PO PACK
17.0000 g | PACK | Freq: Every day | ORAL | Status: DC
  Administered 2018-05-09 – 2018-05-11 (×2): 17 g via ORAL
  Filled 2018-05-09 (×3): qty 1

## 2018-05-09 MED ORDER — RIFAXIMIN 200 MG PO TABS
200.0000 mg | ORAL_TABLET | Freq: Two times a day (BID) | ORAL | Status: DC
  Administered 2018-05-09 – 2018-05-11 (×5): 200 mg via ORAL
  Filled 2018-05-09 (×5): qty 1

## 2018-05-09 MED ORDER — SODIUM CHLORIDE 0.9% FLUSH: 10.0000 mL | INTRAVENOUS | Status: DC | PRN

## 2018-05-09 NOTE — Progress Notes (Signed)
Peripherally Inserted Central Catheter/Midline Placement  The IV Nurse has discussed with the patient and/or persons authorized to consent for the patient, the purpose of this procedure and the potential benefits and risks involved with this procedure.  The benefits include less needle sticks, lab draws from the catheter, and the patient may be discharged home with the catheter. Risks include, but not limited to, infection, bleeding, blood clot (thrombus formation), and puncture of an artery; nerve damage and irregular heartbeat and possibility to perform a PICC exchange if needed/ordered by physician.  Alternatives to this procedure were also discussed.  Bard Power PICC patient education guide, fact sheet on infection prevention and patient information card has been provided to patient /or left at bedside.    PICC/Midline Placement Documentation  PICC Single Lumen 62/83/15 PICC Right Basilic 41 cm 0 cm (Active)  Indication for Insertion or Continuance of Line Prolonged intravenous therapies 05/09/2018 10:59 AM  Exposed Catheter (cm) 0 cm 05/09/2018 10:59 AM  Site Assessment Clean;Dry;Intact 05/09/2018 10:59 AM  Line Status Flushed;Blood return noted;Saline locked 05/09/2018 10:59 AM  Dressing Type Transparent 05/09/2018 10:59 AM  Dressing Status Clean;Dry;Intact;Antimicrobial disc in place 05/09/2018 10:59 AM  Dressing Intervention New dressing 05/09/2018 10:59 AM  Dressing Change Due 05/16/18 05/09/2018 10:59 AM       [address] 05/09/2018, 11:02 AM

## 2018-05-09 NOTE — Progress Notes (Signed)
PROGRESS NOTE    Adrian Compton  OYD:[id number] DOB: [birth year] DOA: 05/02/2018 PCP: Dineen Kid, MD   Brief Narrative:  HPI On 05/03/2018 by Dr. Gean Birchwood Adrian Compton is a 62 y.o. 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 Julien Nordmann oncologist and patient has appointment on June 3 presents to the ER because of worsening low back pain with increasing abdominal distention.  During the last month stay patient was advised about taking diuretics which patient declined.  Denies any nausea vomiting but has very poor appetite has been also having some chest pain which has been constant over the last few days with some shortness of breath mainly due to the abdominal distention and back pain.  Denies any incontinence of urine or bowel.  Interim history  Found to have mets to the spine causing compression fracture. Oncology and radiation oncology consulted, planning on palliative radiation. Palliative care consulted.  Assessment & Plan   Uncontrolled Back pain secondary to L3 compression fracture -Patient recently was diagnosed with metastatic adenocarcinoma was referred to oncology. -CT abdomen pelvis showed multiple lucent lesions present throughout the visible spine increased in size from 03/29/2018 compatible with osseous metastasis.  New L3 superior endplate pathological fracture without loss of vertebral body height. -Only able to complete MRI cervical spine without contrast secondary to pain. Does not feel that he can complete the thoracic and lumber MRI today. -MRI cervical spine: 74mm lesion left C7 to call which was hyper metabolic on PET and most consistent with bony metastatic disease. -Discussed with Dr. Julien Nordmann, however, he is referring patient to  more of an GI onc given that patient's primary is not lung.  -Dr. Lindi Adie consulted and appreciated, recommended radiation oncology for palliative radiation- to start on Monday, 6/3- however this did not occur given his pain -Palliative care consulted for pain management -Continue dexamethasone and ativan  -Currently on Dilaudid PCA pump -per Palliative care not from 6/3, plan is to go home with comfort care and CADD pump PCA -PICC ordered -this morning, patient states he did not want to be comfort care and wanted to reassess getting radiation therapy (RN was present for this conversation)- explained to patient that he needs to speak with Dr. Hilma Favors and formulate a plan that he thinks is best for him; also explained that radiation therapy will not rid him of cancer  Abdominal distention secondary to ascites with history of cirrhosis and hepatitis C -Patient placed empirically on ceftriaxone -CT abdomen pelvis showed cirrhotic liver with stigmata of severe portal hypertension.  Splenomegaly, approximately 2330cc. Moderate volume of ascites.  -s/p Ultrasound-guided paracentesis yielding 5.1L  -Patient has refused diuretics in the past  Chronic anemia and thrombocytopenia -Secondary to cirrhosis -currently stable -Continue to monitor CBC  Elevated blood pressure, no history of HTN -suspect secondary to pain -Continue hydralazine PRN  Metastatic adenocarcinoma -CT abdomen pelvis also noted liver lesions, lung base pulmonary nodules and abdominal lymphadenopathy -Oncology and radiation oncology consulted and appreciated -patient does not wish to pursue chemotherapy treatment however is open to radiation treatment -radiation oncology consulted and appreciated -See discussion above   Moderate malnutrition -nutrition consulted, continue supplements  Goals of care -Discussed with patient and wife at bedside- options such as hospice, comfort care, etc -Palliative care consulted and  appreciated - see discussion above  DVT Prophylaxis  SCDs  Code Status: DNR  Family Communication: None at bedside  Disposition Plan: Admitted.  Pending further recommendations from palliative care  Consultants Oncology Radiation oncology Palliative care  Procedures  US guided paracentesis  Antibiotics   Anti-infectives (From admission, onward)   Start     Dose/Rate Route Frequency Ordered Stop   05/03/18 0600  cefTRIAXone (ROCEPHIN) 2 g in sodium chloride 0.9 % 100 mL IVPB     2 g 200 mL/hr over 30 Minutes Intravenous Every 24 hours 05/03/18 0455        Subjective:   Adrian Compton seen and examined today.  Feels pain is better controlled. States he wants to reassess the radiation option. Denied that he wanted to be comfort care yesterday and that he stated he did not want radiation therapy. Currently patient denies chest pain, shortness of breath.   Objective:   Vitals:   05/08/18 2056 05/09/18 0000 05/09/18 0352 05/09/18 0400  BP: 134/83  (!) 150/88   Pulse: 97  95   Resp: 17 16 17 18   Temp: 98.1 F (36.7 C)  97.9 F (36.6 C)   TempSrc:      SpO2: 98% 97% 93% 96%  Weight:      Height:        Intake/Output Summary (Last 24 hours) at 05/09/2018 1040 Last data filed at 05/09/2018 0600 Gross per 24 hour  Intake 300 ml  Output -  Net 300 ml   Filed Weights   05/05/18 0444 05/07/18 0524 05/08/18 0445  Weight: 94.8 kg (208 lb 15.9 oz) 93.9 kg (207 lb 1.6 oz) 94.1 kg (207 lb 7.3 oz)   Exam  General: Well developed, well nourished, NAD, appears stated age  43: NCAT, mucous membranes moist.   Neck: Supple  Cardiovascular: S1 S2 auscultated, RRR, no murmur  Respiratory: Clear to auscultation bilaterally with equal chest rise  Abdomen: Soft, nontender, distended, + bowel sounds  Extremities: warm dry without cyanosis clubbing or edema  Neuro: AAOx3, nonfocal  Psych: Tearful, anxious  Data Reviewed: I have personally reviewed following labs and  imaging studies  CBC: Recent Labs  Lab 05/02/18 2224 05/03/18 0604 05/04/18 0617 05/05/18 0630 05/08/18 0550  WBC 6.3 6.3 9.4 8.9 11.8*  NEUTROABS 5.2 5.2  --   --   --   HGB 12.6* 12.6* 13.3 13.5 13.3  HCT 36.8* 36.2* 38.7* 38.8* 38.1*  MCV 87.0 88.1 87.0 87.6 86.0  PLT 50* 45* 51* 47* 42*   Basic Metabolic Panel: Recent Labs  Lab 05/02/18 2224 05/03/18 0604 05/04/18 0617 05/05/18 0630 05/08/18 0550  NA 138 137 138 137 132*  K 4.5 4.2 3.8 4.1 4.1  CL 101 101 103 99* 98*  CO2 29 25 25 31 24   GLUCOSE 120* 117* 123* 112* 96  BUN 19 19 25* 25* 25*  CREATININE 0.94 0.86 0.80 0.91 0.87  CALCIUM 10.1 9.8 9.9 9.9 8.3*   GFR: Estimated Creatinine Clearance: 106.6 mL/min (by C-G formula based on SCr of 0.87 mg/dL). Liver Function Tests: Recent Labs  Lab 05/02/18 2224 05/03/18 0604  AST 31 29  ALT 17 16*  ALKPHOS 175* 169*  BILITOT 2.2* 2.2*  PROT 7.7 7.3  ALBUMIN 3.9 3.7   Recent Labs  Lab 05/02/18 2224  LIPASE 26   No results for input(s): AMMONIA in the last 168 hours. Coagulation Profile: No results for input(s): INR, PROTIME in the last 168 hours. Cardiac Enzymes: Recent Labs  Lab 05/03/18 0604 05/03/18 1356 05/03/18 1927  TROPONINI <0.03 <0.03 <0.03   BNP (last 3 results) No results for input(s): PROBNP in the last 8760 hours. HbA1C: No results for input(s): HGBA1C in the last 72 hours. CBG: No results for input(s): GLUCAP in the last 168 hours. Lipid Profile: No results for input(s): CHOL, HDL, LDLCALC, TRIG, CHOLHDL, LDLDIRECT in the last 72 hours. Thyroid Function Tests: No results for input(s): TSH, T4TOTAL, FREET4, T3FREE, THYROIDAB in the last 72 hours. Anemia Panel: No results for input(s): VITAMINB12, FOLATE, FERRITIN, TIBC, IRON, RETICCTPCT in the last 72 hours. Urine analysis:    Component Value Date/Time   COLORURINE AMBER (A) 05/24/2015 1239   APPEARANCEUR CLEAR 05/24/2015 1239   LABSPEC 1.015 05/24/2015 1239   PHURINE 5.5  05/24/2015 1239   GLUCOSEU NEGATIVE 05/24/2015 1239   HGBUR NEGATIVE 05/24/2015 1239   BILIRUBINUR NEGATIVE 05/24/2015 1239   KETONESUR NEGATIVE 05/24/2015 1239   PROTEINUR NEGATIVE 05/24/2015 1239   UROBILINOGEN 1.0 05/24/2015 1239   NITRITE NEGATIVE 05/24/2015 1239   LEUKOCYTESUR NEGATIVE 05/24/2015 1239   Sepsis Labs: @LABRCNTIP (procalcitonin:4,lacticidven:4)  ) Recent Results (from the past 240 hour(s))  Culture, body fluid-bottle     Status: None   Collection Time: 05/03/18 12:39 PM  Result Value Ref Range Status   Specimen Description PERITONEAL  Final   Special Requests NONE  Final   Culture   Final    NO GROWTH 5 DAYS Performed at [facility] [address], [place], [place] [address]    Report Status 05/08/2018 FINAL  Final  Gram stain     Status: None   Collection Time: 05/03/18 12:39 PM  Result Value Ref Range Status   Specimen Description PERITONEAL  Final   Special Requests NONE  Final   Gram Stain   Final    RARE WBC PRESENT, PREDOMINANTLY MONONUCLEAR NO ORGANISMS SEEN Performed at [facility], [place] [address] [place], [place] [address]    Report Status 05/04/2018 FINAL  Final      Radiology Studies: [place] Ekg Site Rite  Result Date: 05/09/2018 If Site Rite image not attached, placement could not be confirmed due to current cardiac rhythm.    Scheduled Meds: . dexamethasone  8 mg Oral Daily  . famotidine  20 mg Oral BID  . feeding supplement (ENSURE ENLIVE)  237 mL Oral BID BM  . HYDROmorphone   Intravenous Q4H  . LORazepam  0.5 mg Intravenous Q8H  . senna-docusate  1 tablet Oral QHS  . sertraline  100 mg Oral Daily   Continuous Infusions: . cefTRIAXone (ROCEPHIN)  IV 2 g (05/09/18 0535)     LOS: 6 days   Time Spent in minutes   30 minutes  Adrain Butrick D.O. on 05/09/2018 at 10:40 AM  Between 7am to 7pm - Pager - [phone]  After 7pm go to [email or web address] - password [email or web address]  And look for the night coverage person covering  for me after hours  [facility] Office  [phone]

## 2018-05-09 NOTE — Progress Notes (Signed)
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  I met with the patient and his wife yesterday and we discussed his goals of care and a plan for his pain management.  Reportedly per nursing he had an unusual episode of confusion today where he could not recall our conversation yesterday and was demonstrating unusual behavior consistent with possible early encephalopathy symptoms.  I arrived in the room he had had some Ativan and he was sleeping basically unable to participate in the conversation, difficulty focusing.  His wife however has a good understanding of his condition and the plan of care, she is very engaged.   Assessment: Patient's pain control is much better today on a Dilaudid PCA his total 24-hour drug dose was 11.4 mg, he did not seem to be overutilizing his every 15 minute bolus doses.  May be demonstrating signs of early hepatic encephalopathy of your doses of opioids I am concerned about rising ammonia levels, edition his abdomen appears to be more tense and I think he would benefit from a paracentesis.  His wife asked questions about prophylaxis for SBP and liver medication he has taken Xifaxan in the past but has stopped taking it because he feels like it makes him have too many bowel movements.  Wife believes she may have this medication at home in sufficient quantity.  Recommendations: Pain: I would recommend leaving his current basal at 0.5 an hour, and his bolus dose every 15 minutes 1 hour max of 2 mg.  Continue his Decadron.  He now has a PICC line he should be discharged with a home CADD pump consult with with hospice on how that order needs to be written at the time of discharge.  Encephalopathy: In general liver disease patients are very sensitive to Ativan -he is extremely groggy and unable to focus on the conversation after receiving a dose of Ativan,I am not opposed  to continuing this medication if it helps with his anxiety and sleep, but be aware that it can have a longer than usual half-life and cause some encephalopathic behavior.  Need to make sure he has had a bowel movement, determine if there are reversible trends causing his encephalopathy, his appetite is poor-no stool documented since 5/31.  He needs to have another paracentesis before he leaves for his comfort his abdomen looks more distended today even compared to yesterday  Consider starting Xifaxan especially if they have a sufficient quantity of this medication at home it may prevent encephalopathy which can be very difficult to handle at end-of-life  Disposition: Agree to hospice care at home. His wife has questions for hospice liaison. Should be ready to go home as soon as he gets paracentesis and hospice sees him.  Lane Hacker, DO Palliative Medicine [phone]  Time: 35 min Greater than 50%  of this time was spent counseling and coordinating care related to the above assessment and plan.

## 2018-05-10 ENCOUNTER — Ambulatory Visit: Payer: Medicare Other

## 2018-05-10 ENCOUNTER — Inpatient Hospital Stay (HOSPITAL_COMMUNITY): Payer: Medicare Other

## 2018-05-10 MED ORDER — LIDOCAINE HCL 1 % IJ SOLN
INTRAMUSCULAR | Status: AC
  Filled 2018-05-10: qty 10

## 2018-05-10 MED ORDER — HYDROMORPHONE 1 MG/ML IV SOLN: INTRAVENOUS | 0 refills | Status: AC

## 2018-05-10 MED ORDER — DOCUSATE SODIUM 100 MG PO CAPS
100.0000 mg | ORAL_CAPSULE | Freq: Two times a day (BID) | ORAL | Status: DC
  Administered 2018-05-10 – 2018-05-11 (×3): 100 mg via ORAL
  Filled 2018-05-10 (×3): qty 1

## 2018-05-10 NOTE — Care Management Note (Addendum)
Case Management Note  Patient Details  Name: Adrian Compton MRN: [id number] Date of Birth: 1956/07/16  Subjective/Objective:         Hospice care for home           Action/Plan: [place], pt name and room number left on answering service along with my call back number/message left for need of home hospice-iv dilaudid, hospital bed, rolling walker, wheel chair, and 3 in 1.  [facility] unable to do iv dilaudid in the home/tct-[facility] Can do the IV pca pump in the home and patient does have a pic line in.  Will arrange meds,care and dme for patient,  Expected Discharge Date:                  Expected Discharge Plan:  [place] Referral:  Hospice / Palliative Care  Discharge planning Services  CM Consult  Post Acute Care Choice:  Hospice Choice offered to:  Spouse  DME Arranged:  Walker rolling, Hospital bed, Wheelchair manual, 3-N-1 DME Agency:  [place]:  RN [facility] Agency:  [facility] [place]  Status of Service:  Completed, signed off  If discussed at [place] of Stay Meetings, dates discussed:    Additional Comments:  Leeroy Cha, RN 05/10/2018, 11:45 AM

## 2018-05-10 NOTE — Procedures (Signed)
Ultrasound-guided  therapeutic paracentesis performed yielding 4.6 liters of hazy, yellow fluid. No immediate complications.

## 2018-05-10 NOTE — Evaluation (Signed)
Clinical/Bedside Swallow Evaluation Patient Details  Name: Adrian Compton MRN: [id number] Date of Birth: Feb 17, 1956  Today's Date: 05/10/2018 Time: SLP Start Time (ACUTE ONLY): 58 SLP Stop Time (ACUTE ONLY): 1128 SLP Time Calculation (min) (ACUTE ONLY): 23 min  Past Medical History:  Past Medical History:  Diagnosis Date  . Chronic low back pain   . Cirrhosis of liver ([place])   . Cirrhosis of liver due to hepatitis C   . Hepatitis C   . Presence of right artificial knee joint    Past Surgical History:  Past Surgical History:  Procedure Laterality Date  . BACK SURGERY    . IR GENERIC HISTORICAL  03/02/2017   IR PARACENTESIS 03/02/2017 [name], PA [facility]-INTERV RAD  . IR PARACENTESIS  04/01/2017  . IR PARACENTESIS  05/10/2017  . IR PARACENTESIS  06/06/2017  . IR PARACENTESIS  07/18/2017  . IR PARACENTESIS  08/12/2017  . IR PARACENTESIS  09/22/2017  . IR PARACENTESIS  11/04/2017  . IR PARACENTESIS  01/05/2018  . IR PARACENTESIS  02/10/2018  . IR PARACENTESIS  02/23/2018  . IR PARACENTESIS  03/16/2018  . IR PARACENTESIS  03/29/2018  . IR PARACENTESIS  04/19/2018  . KNEE ARTHROPLASTY    . LAMINECTOMY     HPI:  pt is a 62 yo male with h/o liver cancer/cirrhosis adm to [facility] with abdomen distention, shortness of breath.  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  He is s/p paracentesis 5/29 and plans are for another paracentesis today.  Pt with severe back pain due to compression fx.  Concerns for aspiration during breakfast noted - pt made npo and swallow eval ordered.  Pt cervical spine image showed Disc degeneration and spondylosis at C6-C7- C7 may be consistent with bone mets.  .     Assessment / Plan / Recommendation Clinical Impression  Pt with indication of pharyngeal and suspected esophageal dysphagia - ? vagus nerve involvement from pt's cancer evidenced by pt's weak/dysphonic voice.  Suspect pt's abdomen fluid retention is contributing to his esophageal  dysphagia and he admits that his swallow improves after paracentesis.   Pt reports overtly coughing/choking with intake of gingerale today causing him some distress.  SLP suspects pt with decreased laryngeal elevation/closure and possible delay in swallow trigger resulting in aspiration.  Chin tuck posture did not improve airway protection/comfort with po intake- and do not recommend to use given possible mets at C7 level.    Use of thickener *nectar* in drinks did provide pt more comfort with intake per pt and prevented coughing with intake.  Pt took medicine with nectar thick liquids - with great tolerance.  SLP Provided him with thickener packets to use as needed.  Did not test solids as pt denies dysphagia to solids.    Recommend re-start diet with precautions for pt's comfort.  Pt expressed gratitude for information.  No further SLP needed. SLP Visit Diagnosis: Dysphagia, unspecified (R13.10)    Aspiration Risk  Moderate aspiration risk    Diet Recommendation Nectar-thick liquid;Regular;Thin liquid   Liquid Administration via: No straw Medication Administration: Whole meds with liquid(nectar or puree - start and follow with liquids) Supervision: Patient able to self feed;Staff to assist with self feeding Compensations: Slow rate;Small sips/bites(NO CHIN TUCK) Postural Changes: Remain upright for at least 30 minutes after po intake;Seated upright at 90 degrees    Other  Recommendations Oral Care Recommendations: Oral care BID   Follow up Recommendations None      Frequency and  Duration     n/a       Prognosis    n/a    Swallow Study   General Date of Onset: 05/10/18 HPI: pt is a 62 yo male with h/o liver cancer/cirrhosis adm to [facility] with abdomen distention, shortness of breath.  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  He is s/p paracentesis 5/29 and plans are for another paracentesis today.  Pt with severe back pain due to compression fx.  Concerns for  aspiration during breakfast noted - pt made npo and swallow eval ordered.  Pt cervical spine image showed Disc degeneration and spondylosis at C6-C7- C7 may be consistent with bone mets.  .   Type of Study: Bedside Swallow Evaluation Diet Prior to this Study: NPO Temperature Spikes Noted: No Respiratory Status: Room air History of Recent Intubation: No Behavior/Cognition: Alert;Cooperative;Pleasant mood Oral Cavity Assessment: Within Functional Limits Oral Care Completed by SLP: No Oral Cavity - Dentition: Adequate natural dentition Vision: Functional for self-feeding Self-Feeding Abilities: Able to feed self Patient Positioning: Upright in bed Baseline Vocal Quality: Suspected CN X (Vagus) involvement;Other (comment)(? compression on vagus nerve) Volitional Cough: Weak Volitional Swallow: (dnt)    Oral/Motor/Sensory Function Overall Oral Motor/Sensory Function: Generalized oral weakness   Ice Chips Ice chips: Not tested   Thin Liquid Thin Liquid: Impaired Presentation: Straw Pharyngeal  Phase Impairments: Cough - Immediate;Suspected delayed Swallow Other Comments: weak cough immediately observed with intake even with chin tuck posture -     Nectar Thick Nectar Thick Liquid: Within functional limits Presentation: Cup;Self Fed   Honey Thick Honey Thick Liquid: Not tested   Puree Puree: Not tested Other Comments: pt decided to take medication with nectar thick liquids   Solid   GO   Solid: Not tested        Macario Golds 05/10/2018,11:43 AM  Adrian Compton, [facility] SLP [phone]

## 2018-05-10 NOTE — Progress Notes (Signed)
Patient moved from room 1501 to 1521 per patient request.  MD, nursing administration aware.

## 2018-05-10 NOTE — Progress Notes (Signed)
PROGRESS NOTE    Adrian Compton  FUX:[id number] DOB: [birth year] DOA: 05/02/2018 PCP: Dineen Kid, MD     Brief Narrative:  Adrian Compton a 62 y.o.malewithhistory of hepatitis C with cirrhosis of liver who was admitted last month and was found to have lymphadenopathy. He was referred to pulmonologist, had PET scan and eventually had undergone iliac bone biopsy which showed metastatic adenocarcinoma. He was referred to Dr. Julien Nordmann oncologist and patient has appointment on June 3. He now presents to the ER because of worsening low back pain with increasing abdominal distention. During the last month stay, patient was advised about taking diuretics which patient declined. Denies any nausea vomiting but has very poor appetite has been also having some chest pain which has been constant over the last few days with some shortness of breath mainly due to the abdominal distention and back pain. Denies any incontinence of urine or bowel. During this hospitalization, he was found to have mets to the spine causing compression fracture. Oncology, radiation oncology, palliative care have been consulted.   New events last 24 hours / Subjective: This morning when I walked into his room, he was eating breakfast in bed, drinking OJ, and was coughing and stating "I can't breathe." He appeared to have actively aspirated during breakfast. Pulse ox was 97% on room air. After a while, he became comfortable and conversational without distress. He admitted his abdomen and back were "not doing well."   Assessment & Plan:   Principal Problem:   Closed compression fracture of L3 lumbar vertebra, initial encounter ([place]) Active Problems:   Hepatic cirrhosis due to chronic hepatitis C infection (HCC)   Ascites   Thrombocytopenia, secondary due to cirrhosis   Low back pain   Elevated blood pressure reading   Pathological compression fracture of spine (HCC)   Malnutrition of moderate degree   Palliative care  encounter   Uncontrolled back pain secondary to L3 compression fracture -Patient recently was diagnosed with metastatic adenocarcinoma was referred to oncology. -CT abdomen pelvis showed multiple lucent lesions present throughout the visible spine increased in size from 03/29/2018 compatible with osseous metastasis.  New L3 superior endplate pathological fracture without loss of vertebral body height. -Only able to complete MRI cervical spine without contrast secondary to pain. Does not feel that he can complete the thoracic and lumber MRI. -MRI cervical spine: 30mm lesion left C7 to call which was hyper metabolic on PET and most consistent with bony metastatic disease. -Dr. Ree Kida discussed with Dr. Julien Nordmann, however, he is referring patient to more of an GI onc given that patient's primary is not lung. Dr. Lindi Adie consulted, recommended radiation oncology for palliative radiation- to start on Monday, 6/3- however this did not occur given his pain -Palliative care consulted for pain management. Plan is to go home with comfort care and CADD pump PCA -Continue dexamethasone and ativan   Abdominal distention secondary to ascites with history of cirrhosis and hepatitis C -Patient placed empirically on ceftriaxone, gram stain was negative -CT abdomen pelvis showed cirrhotic liver with stigmata of severe portal hypertension.  Splenomegaly, approximately 2330cc. Moderate volume of ascites.  -S/p Ultrasound-guided paracentesis yielding 5.1L, 5/29 -Patient has refused diuretics in the past -Plan for paracentesis again today for comfort -Started on xifaxan yesterday    Chronic anemia and thrombocytopenia -Secondary to cirrhosis  Elevated blood pressure, no history of HTN -Suspect secondary to pain -Continue hydralazine PRN -Improved this morning   Metastatic adenocarcinoma -CT abdomen pelvis also noted liver lesions,  lung base pulmonary nodules and abdominal lymphadenopathy -Oncology and  radiation oncology consulted and appreciated -Patient does not wish to pursue chemotherapy treatment however is open to radiation treatment   Moderate malnutrition -Nutrition consulted, continue supplements  Aspiration event -SLP consulted this morning   DVT prophylaxis: SCD Code Status: DNR Family Communication: No family at bedside this morning Disposition Plan: Paracentesis and SLP eval today. Plan for discharge to home with home hospice with CADD pump for pain control. PICC line in place.    Consultants:   Oncology  Radiation oncology  Palliative care  Procedures:   Paracentesis   Antimicrobials:  Anti-infectives (From admission, onward)   Start     Dose/Rate Route Frequency Ordered Stop   05/09/18 1530  rifaximin (XIFAXAN) tablet 200 mg     200 mg Oral 2 times daily 05/09/18 1450     05/03/18 0600  cefTRIAXone (ROCEPHIN) 2 g in sodium chloride 0.9 % 100 mL IVPB  Status:  Discontinued     2 g 200 mL/hr over 30 Minutes Intravenous Every 24 hours 05/03/18 0455 05/10/18 0955       Objective: Vitals:   05/09/18 2008 05/09/18 2352 05/10/18 0210 05/10/18 0428  BP:  (!) 143/100  (!) 146/94  Pulse:  90  (!) 103  Resp: 15 20 18 19   Temp:  98.3 F (36.8 C)  97.6 F (36.4 C)  TempSrc:  Oral  Oral  SpO2: 95% 99% 99% 95%  Weight:      Height:       No intake or output data in the 24 hours ending 05/10/18 0956 Filed Weights   05/05/18 0444 05/07/18 0524 05/08/18 0445  Weight: 94.8 kg (208 lb 15.9 oz) 93.9 kg (207 lb 1.6 oz) 94.1 kg (207 lb 7.3 oz)    Examination (second examination after aspiration event): General exam: Appears calm and comfortable on room air   Respiratory system: Clear to auscultation. Respiratory effort normal. Cardiovascular system: S1 & S2 heard, RRR. No JVD, murmurs, rubs, gallops or clicks. No pedal edema. Gastrointestinal system: Abdomen is distended but soft and nontender. No organomegaly or masses felt. Normal bowel sounds  heard. Central nervous system: Alert and oriented. No focal neurological deficits. Extremities: Symmetric  Skin: No rashes, lesions or ulcers Psychiatry: Judgement and insight appear normal. Mood & affect appropriate.   Data Reviewed: I have personally reviewed following labs and imaging studies  CBC: Recent Labs  Lab 05/04/18 0617 05/05/18 0630 05/08/18 0550  WBC 9.4 8.9 11.8*  HGB 13.3 13.5 13.3  HCT 38.7* 38.8* 38.1*  MCV 87.0 87.6 86.0  PLT 51* 47* 42*   Basic Metabolic Panel: Recent Labs  Lab 05/04/18 0617 05/05/18 0630 05/08/18 0550  NA 138 137 132*  K 3.8 4.1 4.1  CL 103 99* 98*  CO2 25 31 24   GLUCOSE 123* 112* 96  BUN 25* 25* 25*  CREATININE 0.80 0.91 0.87  CALCIUM 9.9 9.9 8.3*   GFR: Estimated Creatinine Clearance: 106.6 mL/min (by C-G formula based on SCr of 0.87 mg/dL). Liver Function Tests: No results for input(s): AST, ALT, ALKPHOS, BILITOT, PROT, ALBUMIN in the last 168 hours. No results for input(s): LIPASE, AMYLASE in the last 168 hours. No results for input(s): AMMONIA in the last 168 hours. Coagulation Profile: No results for input(s): INR, PROTIME in the last 168 hours. Cardiac Enzymes: Recent Labs  Lab 05/03/18 1356 05/03/18 1927  TROPONINI <0.03 <0.03   BNP (last 3 results) No results for input(s): PROBNP in the  last 8760 hours. HbA1C: No results for input(s): HGBA1C in the last 72 hours. CBG: No results for input(s): GLUCAP in the last 168 hours. Lipid Profile: No results for input(s): CHOL, HDL, LDLCALC, TRIG, CHOLHDL, LDLDIRECT in the last 72 hours. Thyroid Function Tests: No results for input(s): TSH, T4TOTAL, FREET4, T3FREE, THYROIDAB in the last 72 hours. Anemia Panel: No results for input(s): VITAMINB12, FOLATE, FERRITIN, TIBC, IRON, RETICCTPCT in the last 72 hours. Sepsis Labs: No results for input(s): PROCALCITON, LATICACIDVEN in the last 168 hours.  Recent Results (from the past 240 hour(s))  Culture, body fluid-bottle      Status: None   Collection Time: 05/03/18 12:39 PM  Result Value Ref Range Status   Specimen Description PERITONEAL  Final   Special Requests NONE  Final   Culture   Final    NO GROWTH 5 DAYS Performed at [facility], [address], [place], [place] [address]    Report Status 05/08/2018 FINAL  Final  Gram stain     Status: None   Collection Time: 05/03/18 12:39 PM  Result Value Ref Range Status   Specimen Description PERITONEAL  Final   Special Requests NONE  Final   Gram Stain   Final    RARE WBC PRESENT, PREDOMINANTLY MONONUCLEAR NO ORGANISMS SEEN Performed at [facility], [place] [address] [place], [place] [address]    Report Status 05/04/2018 FINAL  Final       Radiology Studies: [place] Ekg Site Rite  Result Date: 05/09/2018 If Site Rite image not attached, placement could not be confirmed due to current cardiac rhythm.     Scheduled Meds: . dexamethasone  8 mg Oral Daily  . docusate sodium  100 mg Oral BID  . famotidine  20 mg Oral BID  . feeding supplement (ENSURE ENLIVE)  237 mL Oral BID BM  . HYDROmorphone   Intravenous Q4H  . LORazepam  0.5 mg Intravenous Q8H  . polyethylene glycol  17 g Oral Daily  . rifaximin  200 mg Oral BID  . senna-docusate  1 tablet Oral QHS  . sertraline  100 mg Oral Daily   Continuous Infusions:    LOS: 7 days    Time spent: 45 minutes   [name], DO [facility] [email or web address] Password [email or web address] 05/10/2018, 9:56 AM

## 2018-05-11 ENCOUNTER — Ambulatory Visit: Payer: Medicare Other

## 2018-05-11 ENCOUNTER — Encounter: Payer: Self-pay | Admitting: Radiation Oncology

## 2018-05-11 DIAGNOSIS — S32030A Wedge compression fracture of third lumbar vertebra, initial encounter for closed fracture: Secondary | ICD-10-CM

## 2018-05-11 DIAGNOSIS — M545 Low back pain: Secondary | ICD-10-CM

## 2018-05-11 DIAGNOSIS — K7460 Unspecified cirrhosis of liver: Secondary | ICD-10-CM

## 2018-05-11 DIAGNOSIS — B182 Chronic viral hepatitis C: Secondary | ICD-10-CM

## 2018-05-11 MED ORDER — POLYETHYLENE GLYCOL 3350 17 G PO PACK: 17.0000 g | PACK | Freq: Every day | ORAL | 0 refills | Status: AC

## 2018-05-11 MED ORDER — SENNOSIDES-DOCUSATE SODIUM 8.6-50 MG PO TABS: 1.0000 | ORAL_TABLET | Freq: Every day | ORAL | 0 refills | Status: AC

## 2018-05-11 MED ORDER — FAMOTIDINE 20 MG PO TABS: 20.0000 mg | ORAL_TABLET | Freq: Two times a day (BID) | ORAL | 0 refills | Status: AC

## 2018-05-11 MED ORDER — SODIUM CHLORIDE 0.9% FLUSH: 10.0000 mL | INTRAVENOUS | 1 refills | Status: AC | PRN

## 2018-05-11 MED ORDER — RIFAXIMIN 200 MG PO TABS: 200.0000 mg | ORAL_TABLET | Freq: Two times a day (BID) | ORAL | 0 refills | Status: AC

## 2018-05-11 MED ORDER — DOCUSATE SODIUM 100 MG PO CAPS: 100.0000 mg | ORAL_CAPSULE | Freq: Two times a day (BID) | ORAL | 0 refills | Status: AC

## 2018-05-11 MED ORDER — ENSURE ENLIVE PO LIQD: 237.0000 mL | Freq: Two times a day (BID) | ORAL | 12 refills | Status: AC

## 2018-05-11 MED ORDER — DEXAMETHASONE 4 MG PO TABS: 8.0000 mg | ORAL_TABLET | Freq: Every day | ORAL | 0 refills | Status: AC

## 2018-05-11 MED ORDER — LORAZEPAM 0.5 MG PO TABS: 0.5000 mg | ORAL_TABLET | Freq: Four times a day (QID) | ORAL | 0 refills | Status: AC | PRN

## 2018-05-11 MED ORDER — HYDROMORPHONE HCL 1 MG/ML IJ SOLN
0.5000 mg | Freq: Once | INTRAMUSCULAR | Status: AC
  Administered 2018-05-11: 0.5 mg via INTRAVENOUS
  Filled 2018-05-11: qty 0.5

## 2018-05-11 MED ORDER — GI COCKTAIL ~~LOC~~: 30.0000 mL | Freq: Three times a day (TID) | ORAL | 0 refills | Status: AC | PRN

## 2018-05-11 NOTE — Discharge Summary (Signed)
Adrian Compton, is a 62 y.o. male  DOB [birth year]  MRN [id number].  Admission date:  05/02/2018  Admitting Physician  [name], MD  Discharge Date:  05/11/2018   Primary MD  Via, Lennette Bihari, MD  Recommendations for primary care physician for things to follow:     Uncontrolled back pain secondary to L3 compression fracture -Patient recently was diagnosed with metastatic adenocarcinoma was referred to oncology. -CT abdomen pelvis showed multiple lucent lesions present throughout the visible spine increased in size from 03/29/2018 compatible with osseous metastasis. New L3 superior endplate pathological fracture without loss of vertebral body height. -Only able to complete MRI cervical spine without contrast secondary to pain. Does not feel that he can complete the thoracic and lumber MRI. -MRI cervical spine: 89mm lesion left C7 to call which was hyper metabolic on PET and most consistent with bony metastatic disease. -Dr. Ree Kida discussed with Dr. Julien Nordmann, however, he is referring patient to more of an GI onc given that patient's primary is not lung. Dr. Lindi Adie consulted, recommended radiation oncology for palliative radiation- to start on Monday, 6/3- however this did not occur given his pain -Palliative care consulted for pain management. Plan is to go home with comfort care and CADD pump PCA -Continue dexamethasone and ativan  -follow up with [facility] for pain control,  appreciate input  Abdominal distention secondary to ascites with history of cirrhosis and hepatitis C -Patient placed empirically on ceftriaxone, gram stain was negative -CT abdomen pelvis showed cirrhotic liver with stigmata of severe portal hypertension. Splenomegaly, approximately 2330cc. Moderate volume of ascites.  -Started on xifaxan 6/5  -S/p Ultrasound-guided paracentesis yielding 5.1L, 5/29 -Patient  has refused diuretics in the past -Paracentesis 6/5 => 4.6L,  May need paracentesis in the future for comfort, defer to PCP to arrange, f/u with pcp in 1 week to see if needs further paracentesis   Chronic anemia and thrombocytopenia -Secondary to cirrhosis  Elevated blood pressure, no history of HTN -Suspect secondary to pain -please continue to monitor  Metastatic adenocarcinoma -CT abdomen pelvis also noted liver lesions, lung base pulmonary nodules and abdominal lymphadenopathy -Oncology and radiation oncology consulted and appreciated -Patient does not wish to pursue chemotherapy treatment however is open to radiation treatment -please f/u with Dr. Lisbeth Renshaw in 1 week  Moderate malnutrition -Nutrition consulted, continue supplements  Aspiration event -SLP consulted    Code Status: DNR       Admission Diagnosis  Back pain [M54.9]   Discharge Diagnosis  Back pain [M54.9]   Principal Problem:   Closed compression fracture of L3 lumbar vertebra, initial encounter ([place]) Active Problems:   Hepatic cirrhosis due to chronic hepatitis C infection ([place])   Ascites   Thrombocytopenia, secondary due to cirrhosis   Low back pain   Elevated blood pressure reading   Pathological compression fracture of spine ([place])   Malnutrition of moderate degree   Palliative care encounter      Past Medical History:  Diagnosis Date  .  Chronic low back pain   . Cirrhosis of liver ([place])   . Cirrhosis of liver due to hepatitis C   . Hepatitis C   . Presence of right artificial knee joint     Past Surgical History:  Procedure Laterality Date  . BACK SURGERY    . IR GENERIC HISTORICAL  03/02/2017   IR PARACENTESIS 03/02/2017 [name], PA [facility]-INTERV RAD  . IR PARACENTESIS  04/01/2017  . IR PARACENTESIS  05/10/2017  . IR PARACENTESIS  06/06/2017  . IR PARACENTESIS  07/18/2017  . IR PARACENTESIS  08/12/2017  . IR PARACENTESIS  09/22/2017  . IR PARACENTESIS  11/04/2017  . IR  PARACENTESIS  01/05/2018  . IR PARACENTESIS  02/10/2018  . IR PARACENTESIS  02/23/2018  . IR PARACENTESIS  03/16/2018  . IR PARACENTESIS  03/29/2018  . IR PARACENTESIS  04/19/2018  . KNEE ARTHROPLASTY    . LAMINECTOMY         HPI  from the history and physical done on the day of admission:      62 y.o.malewithhistory of hepatitis C with cirrhosis of liver who was admitted last month and was found to have lymphadenopathy. He was referred to pulmonologist, had PET scan and eventually had undergone iliac bone biopsy which showed metastatic adenocarcinoma. He was referred to Dr. Julien Nordmann oncologist and patient has appointment on June 3. He now presents to the ER because of worsening low back pain with increasing abdominal distention. During the last month stay, patient was advised about taking diuretics which patient declined. Denies any nausea vomiting but has very poor appetite has been also having some chest pain which has been constant over the last few days with some shortness of breath mainly due to the abdominal distention and back pain. Denies any incontinence of urine or bowel. During this hospitalization, he was found to have mets to the spine causing compression fracture. Oncology, radiation oncology, palliative care have been consulted.      Hospital Course:     Pt was admitted and found to have uncontrolled back pain due to L3 compression fracture.  Palliative care was consulted for pain management. Dilaudid pca seemed to work well for the patient and his pain. Pt was started on xifaxan for his cirrhosis.   Pt had ultrasound paracentesis 5/29=> 5.1L and then  6/5 w 4.6L. Pt has refused diuretics . Radiation oncology consulted and tx given.  Pt is feeling better. He wants to go home with comfort care and PCA.   Follow UP  Follow-up Information    Via, Lennette Bihari, MD Follow up in 1 week(s).   Specialty:  Family Medicine Contact information: [place]  [address] [address] [place], [facility] up in 1 day(s).   Contact information: [address] [place] [address] [phone]        Kyung Rudd, MD Follow up in 1 week(s).   Specialty:  Radiation Oncology Contact information: [address] [place] [address] [phone]            Consults obtained - radiation oncology, IR, oncology, palliative  care  Discharge Condition: stable  Diet and Activity recommendation: See Discharge Instructions below  Discharge Instructions         Discharge Medications     Allergies as of 05/11/2018      Reactions   Nuvigil [armodafinil] Anaphylaxis, Hives   Codeine Nausea And Vomiting   Lactose  Intolerance (gi) Other (See Comments)   unspecified      Medication List    STOP taking these medications   Oxycodone HCl 10 MG Tabs   phytonadione 5 MG tablet Commonly known as:  VITAMIN K     TAKE these medications   albuterol 108 (90 Base) MCG/ACT inhaler Commonly known as:  PROVENTIL HFA;VENTOLIN HFA Inhale 2 puffs into the lungs every 6 (six) hours as needed for wheezing.   dexamethasone 4 MG tablet Commonly known as:  DECADRON Take 2 tablets (8 mg total) by mouth daily.   docusate sodium 100 MG capsule Commonly known as:  COLACE Take 1 capsule (100 mg total) by mouth 2 (two) times daily.   famotidine 20 MG tablet Commonly known as:  PEPCID Take 1 tablet (20 mg total) by mouth 2 (two) times daily.   feeding supplement (ENSURE ENLIVE) Liqd Take 237 mLs by mouth 2 (two) times daily between meals.   gi cocktail Susp suspension Take 30 mLs by mouth 3 (three) times daily as needed for indigestion. Shake well.   HYDROmorphone 1 mg/mL injection Commonly known as:  DILAUDID 0.5mg /hr as basal rate. May bolus 0.5mg  every 15 minutes as needed for maximum 2mg  in 1 hour.   Lidocaine 4 % Ptch Apply 1 patch topically daily as needed (pain).   LORazepam 0.5 MG tablet Commonly known as:   ATIVAN Take 1 tablet (0.5 mg total) by mouth every 6 (six) hours as needed for anxiety or sleep.   ondansetron 4 MG tablet Commonly known as:  ZOFRAN Take 1 tablet (4 mg total) by mouth every 6 (six) hours. What changed:    when to take this  reasons to take this   polyethylene glycol packet Commonly known as:  MIRALAX / GLYCOLAX Take 17 g by mouth daily.   rifaximin 200 MG tablet Commonly known as:  XIFAXAN Take 1 tablet (200 mg total) by mouth 2 (two) times daily.   senna-docusate 8.6-50 MG tablet Commonly known as:  Senokot-S Take 1 tablet by mouth at bedtime.   sertraline 100 MG tablet Commonly known as:  ZOLOFT Take 100 mg by mouth daily.   sodium chloride flush 0.9 % Soln Commonly known as:  NS 10-40 mLs by Intracatheter route as needed (flush).       Major procedures and Radiology Reports - PLEASE review detailed and final reports for all details, in brief -       Dg Chest 2 View  Result Date: 05/03/2018 CLINICAL DATA:  Acute onset of shortness of breath and upper abdominal pain. EXAM: CHEST - 2 VIEW COMPARISON:  Chest radiograph and CTA of the chest performed 03/28/2018; PET/CT performed 04/14/2018 FINDINGS: The lungs are well-aerated. Minimal left basilar atelectasis is noted. The known malignancy at the left lung base is not well characterized on radiograph. There is no evidence of pleural effusion or pneumothorax. The heart is normal in size; the mediastinal contour is within normal limits. No acute osseous abnormalities are seen. IMPRESSION: Minimal left basilar atelectasis noted. The known malignancy at the left lung base is not well characterized on radiograph. Electronically Signed   By: Garald Balding M.D.   On: 05/03/2018 00:00   Mr Cervical Spine Wo Contrast  Result Date: 05/03/2018 CLINICAL DATA:  Metastatic adenocarcinoma. EXAM: MRI CERVICAL SPINE WITHOUT CONTRAST TECHNIQUE: Multiplanar, multisequence MR imaging of the cervical spine was performed. No  intravenous contrast was administered. COMPARISON:  Fraser Din 04/14/2018 FINDINGS: Alignment: Normal alignment.  Straightening of the cervical lordosis  Image quality degraded by motion. The patient not able tolerate postcontrast imaging. MRI thoracic and lumbar spine could not be performed at this time, the patient could not tolerate further imaging Vertebrae: Negative for fracture. 12 mm hyperintensity in the T1 pedicle on the left. This is best seen on axial gradient images. This was hypermetabolic on PET and is most likely due to metastatic disease. The lesion is difficult to see on sagittal images. There may be some extension into the vertebral body on the left on sagittal inversion recovery. Hemangioma C7 vertebral body. Cord: Normal signal and morphology Posterior Fossa, vertebral arteries, paraspinal tissues: Negative Disc levels: C2-3: Negative C3-4: Mild disc and facet degeneration.  Mild spinal stenosis C4-5: Mild disc degeneration with diffuse spurring. Mild facet degeneration. Moderate spinal stenosis with cord flattening. Moderate foraminal stenosis bilaterally. C5-6: Moderate disc degeneration and spurring, left greater than right. Cord flattening with moderate spinal stenosis. Moderate left foraminal encroachment. C6-7: Disc degeneration and spondylosis. Cord flattening with moderate spinal stenosis and moderate foraminal stenosis bilaterally C7-T1: Negative for stenosis IMPRESSION: Image quality degraded by motion. The patient was not able to tolerate intravenous contrast for further imaging of the thoracic or lumbar spine. 12 mm lesion left C7 pedicle which was hypermetabolic on PET and is most consistent with bony metastatic disease. No extension into the canal. No fracture Multilevel spondylosis and spinal stenosis as described above. Electronically Signed   By: Franchot Gallo M.D.   On: 05/03/2018 11:39   Ct Abdomen Pelvis W Contrast  Result Date: 05/03/2018 CLINICAL DATA:  62 y/o M; central  abdominal pain radiating to the left side. History of stage IV liver cancer and cirrhosis. EXAM: CT ABDOMEN AND PELVIS WITH CONTRAST CT LUMBAR SPINE WITHOUT CONTRAST TECHNIQUE: Multidetector CT imaging of the abdomen and pelvis was performed using the standard protocol following bolus administration of intravenous contrast. Multidetector CT imaging of the lumbar spine was performed without intravenous contrast administration. Multiplanar CT image reconstructions were also generated. CONTRAST:  131mL ISOVUE-300 IOPAMIDOL (ISOVUE-300) INJECTION 61% COMPARISON:  04/14/2018 PET-CT.  03/30/2018 CT abdomen and pelvis. FINDINGS: CT ABDOMEN AND PELVIS Lower chest: Multiple stable pulmonary nodules in the lung bases the largest measuring 16 mm within the medial left lower lobe (series 4, image 21). Hepatobiliary: Cirrhotic liver. Ill-defined hypoattenuating central liver mass measuring up to 7.1 cm, stable from prior PET-CT given differences in technique. Additional subcentimeter nodules are present within liver segments 3, 6, 7, and 8 (series 2 image 9, 11, 14, 18, 24, 27). Tips catheter in situ. Severe enlargement of the portal venous system with lower esophageal, gastrohepatic, splenic, and mesenteric collaterals. Pancreas: Unremarkable. No pancreatic ductal dilatation or surrounding inflammatory changes. Spleen: Spleen measures 19.7 x 13.2 x 17.1 cm (volume = 2330 cm^3). Adrenals/Urinary Tract: Small caliber right kidney. Normal appearance of the left kidney. No hydronephrosis. Normal bladder. Stomach/Bowel: Stomach is within normal limits. Appendix appears normal. No evidence of bowel wall thickening, distention, or inflammatory changes. Vascular/Lymphatic: Aortic atherosclerosis. Mediastinal, portal, para-aortic lymphadenopathy is similar to prior PET-CT where it is better characterize. Reproductive: Prostatic calcification. Other: Moderate volume of ascites is increased from 04/14/2018. Musculoskeletal: As below. CT  LUMBAR SPINE Segmentation: 5 lumbar type vertebrae. Alignment: Normal. Vertebrae: Multiple lucent lesions are present throughout the visible spine which are increased in size from 03/29/2018 likely representing osseous metastasis. There is a superior endplate fracture associated with the L3 vertebral body without significant loss of vertebral body height. Paraspinal and other soft tissues: As above. Disc levels:  Mild multilevel discogenic degenerative changes and moderate lower lumbar facet arthrosis. Multifactorial high-grade canal stenosis at the L4-5 level. Disc and facet degenerative changes results in mild-to-moderate bilateral foraminal stenosis at L4-5 and L5-S1. IMPRESSION: 1. Multiple lucent lesions are present throughout the visible spine increased in size from 03/29/2018 compatible with osseous metastasis. New L3 superior endplate pathologic fracture without loss of vertebral body height. 2. Liver lesions, lung base pulmonary nodules, and abdominal lymphadenopathy is stable from prior PET-CT where they are better characterized. 3. Cirrhotic liver with stigmata of severe portal hypertension. Splenomegaly, approximately 2330 cc. 4. Moderate volume of ascites is increased from 04/14/2018. Electronically Signed   By: Kristine Garbe M.D.   On: 05/03/2018 01:40   Nm Pet Image Initial (pi) Skull Base To Thigh  Result Date: 04/14/2018 CLINICAL DATA:  Initial treatment strategy for pulmonary nodules. Mediastinal lymphadenopathy. EXAM: NUCLEAR MEDICINE PET SKULL BASE TO THIGH TECHNIQUE: 12 mCi F-18 FDG was injected intravenously. Full-ring PET imaging was performed from the skull base to thigh after the radiotracer. CT data was obtained and used for attenuation correction and anatomic localization. Fasting blood glucose: 122 mg/dl COMPARISON:  CT abdomen/pelvis dated 03/29/2018. CTA chest dated 03/28/2018. FINDINGS: Mediastinal blood pool activity: SUV max 3.1 NECK: No hypermetabolic cervical  lymphadenopathy. Incidental CT findings: none CHEST: 1.6 cm irregular nodule in the medial left lower lobe (series 8/image 58), max SUV 4.1, worrisome for primary bronchogenic neoplasm. Thoracic lymphadenopathy, including: --1.8 cm short axis right paratracheal node (series 4/image 70), max SUV 6.7 --2.1 cm short axis AP window node (series 4/image 31), max SUV 7.4 --2.6 cm short axis subcarinal node (series 4/image 39), max SUV 8.4 --Left hilar node, max SUV 5.5 Incidental CT findings: Mild atherosclerotic calcifications of the aortic arch. ABDOMEN/PELVIS: Focal hypermetabolism in the central right hepatic lobe/caudate, with suspected underlying 7.2 x 4.9 cm mass on CT (series 4/image 106), max SUV 7.6. Given associated cirrhosis, differential considerations include hepatocellular carcinoma versus metastasis. Associated upper abdominal/portacaval nodes measuring up to 16 mm short axis (series 4/image 125), max SUV 7.5. Incidental CT findings: Splenomegaly. Tips shunt. Moderate abdominopelvic ascites. Atherosclerotic calcifications the abdominal aorta and branch vessels. Sigmoid diverticulosis, without evidence of diverticulitis. SKELETON: Multifocal osseous metastases throughout the visualized axial and appendicular skeleton. Representative lesions include: --Left humeral head, max SUV 6.0 --Left T1 vertebral body, max SUV 7.8 --Left posterolateral 9th rib with pathologic fracture (series 4/image 113), max SUV 5.2 --Left L2 vertebral body, max SUV 7.9 --Right sacrum, max SUV 6.6 Incidental CT findings: Degenerative changes of the visualized thoracolumbar spine. IMPRESSION: 1.6 mm medial left lower lobe pulmonary nodule, worrisome for primary bronchogenic neoplasm. Associated thoracic nodal metastases. 7.2 cm mass in the central right hepatic lobe/caudate, worrisome for hepatocellular carcinoma versus metastasis. Additional upper abdominal nodal metastases. Multifocal osseous metastases throughout the visualized  axial and appendicular skeleton, with representative lesions as above, including pathologic fracture of the left posterolateral 9th rib. Additional ancillary findings as above. Electronically Signed   By: Julian Hy M.D.   On: 04/14/2018 17:03   US Paracentesis  Result Date: 05/10/2018 INDICATION: Hepatitis-C, cirrhosis, metastatic adenocarcinoma, prior TIPS, recurrent ascites. Request made for therapeutic paracentesis. EXAM: ULTRASOUND GUIDED THERAPEUTIC PARACENTESIS MEDICATIONS: None COMPLICATIONS: None immediate. PROCEDURE: Informed written consent was obtained from the patient after a discussion of the risks, benefits and alternatives to treatment. A timeout was performed prior to the initiation of the procedure. Initial ultrasound scanning demonstrates a large amount of ascites within the left lower abdominal quadrant. The  left lower abdomen was prepped and draped in the usual sterile fashion. 1% lidocaine was used for local anesthesia. Following this, a 19 gauge, 7-cm, Yueh catheter was introduced. An ultrasound image was saved for documentation purposes. The paracentesis was performed. The catheter was removed and a dressing was applied. The patient tolerated the procedure well without immediate post procedural complication. FINDINGS: A total of approximately 4.6 liters of hazy, yellow fluid was removed. IMPRESSION: Successful ultrasound-guided therapeutic paracentesis yielding 4.6 liters of peritoneal fluid. Read by: Rowe Robert, PA-C Electronically Signed   By: Jerilynn Mages.  Shick M.D.   On: 05/10/2018 15:35   US Paracentesis  Result Date: 05/03/2018 INDICATION: History of cirrhosis and hepatitis C. Abdominal distention secondary to recurrent ascites. Request for diagnostic and therapeutic PARACENTESIS. EXAM: ULTRASOUND GUIDED RIGHT LOWER QUADRANT PARACENTESIS MEDICATIONS: None. COMPLICATIONS: None immediate. PROCEDURE: Informed written consent was obtained from the patient after a discussion of the  risks, benefits and alternatives to treatment. A timeout was performed prior to the initiation of the procedure. Initial ultrasound scanning demonstrates a large amount of ascites within the right lower abdominal quadrant. The right lower abdomen was prepped and draped in the usual sterile fashion. 1% lidocaine with epinephrine was used for local anesthesia. Following this, a 19 gauge, 7-cm, Yueh catheter was introduced. An ultrasound image was saved for documentation purposes. The paracentesis was performed. The catheter was removed and a dressing was applied. The patient tolerated the procedure well without immediate post procedural complication. FINDINGS: A total of approximately 5.1 L of hazy, dark yellow fluid was removed. Samples were sent to the laboratory as requested by the clinical team. IMPRESSION: Successful ultrasound-guided paracentesis yielding 5.1 liters of peritoneal fluid. Read by: Ascencion Dike PA-C Electronically Signed   By: Sandi Mariscal M.D.   On: 05/03/2018 12:57   Ct L-spine No Charge  Result Date: 05/03/2018 CLINICAL DATA:  62 y/o M; central abdominal pain radiating to the left side. History of stage IV liver cancer and cirrhosis. EXAM: CT ABDOMEN AND PELVIS WITH CONTRAST CT LUMBAR SPINE WITHOUT CONTRAST TECHNIQUE: Multidetector CT imaging of the abdomen and pelvis was performed using the standard protocol following bolus administration of intravenous contrast. Multidetector CT imaging of the lumbar spine was performed without intravenous contrast administration. Multiplanar CT image reconstructions were also generated. CONTRAST:  162mL ISOVUE-300 IOPAMIDOL (ISOVUE-300) INJECTION 61% COMPARISON:  04/14/2018 PET-CT.  03/30/2018 CT abdomen and pelvis. FINDINGS: CT ABDOMEN AND PELVIS Lower chest: Multiple stable pulmonary nodules in the lung bases the largest measuring 16 mm within the medial left lower lobe (series 4, image 21). Hepatobiliary: Cirrhotic liver. Ill-defined hypoattenuating  central liver mass measuring up to 7.1 cm, stable from prior PET-CT given differences in technique. Additional subcentimeter nodules are present within liver segments 3, 6, 7, and 8 (series 2 image 9, 11, 14, 18, 24, 27). Tips catheter in situ. Severe enlargement of the portal venous system with lower esophageal, gastrohepatic, splenic, and mesenteric collaterals. Pancreas: Unremarkable. No pancreatic ductal dilatation or surrounding inflammatory changes. Spleen: Spleen measures 19.7 x 13.2 x 17.1 cm (volume = 2330 cm^3). Adrenals/Urinary Tract: Small caliber right kidney. Normal appearance of the left kidney. No hydronephrosis. Normal bladder. Stomach/Bowel: Stomach is within normal limits. Appendix appears normal. No evidence of bowel wall thickening, distention, or inflammatory changes. Vascular/Lymphatic: Aortic atherosclerosis. Mediastinal, portal, para-aortic lymphadenopathy is similar to prior PET-CT where it is better characterize. Reproductive: Prostatic calcification. Other: Moderate volume of ascites is increased from 04/14/2018. Musculoskeletal: As below. CT LUMBAR SPINE Segmentation: 5  lumbar type vertebrae. Alignment: Normal. Vertebrae: Multiple lucent lesions are present throughout the visible spine which are increased in size from 03/29/2018 likely representing osseous metastasis. There is a superior endplate fracture associated with the L3 vertebral body without significant loss of vertebral body height. Paraspinal and other soft tissues: As above. Disc levels: Mild multilevel discogenic degenerative changes and moderate lower lumbar facet arthrosis. Multifactorial high-grade canal stenosis at the L4-5 level. Disc and facet degenerative changes results in mild-to-moderate bilateral foraminal stenosis at L4-5 and L5-S1. IMPRESSION: 1. Multiple lucent lesions are present throughout the visible spine increased in size from 03/29/2018 compatible with osseous metastasis. New L3 superior endplate  pathologic fracture without loss of vertebral body height. 2. Liver lesions, lung base pulmonary nodules, and abdominal lymphadenopathy is stable from prior PET-CT where they are better characterized. 3. Cirrhotic liver with stigmata of severe portal hypertension. Splenomegaly, approximately 2330 cc. 4. Moderate volume of ascites is increased from 04/14/2018. Electronically Signed   By: Kristine Garbe M.D.   On: 05/03/2018 01:40   Ct Biopsy  Result Date: 04/24/2018 INDICATION: Hypermetabolic bone lesions including the right iliac bone EXAM: CT BIOPSY MEDICATIONS: None. ANESTHESIA/SEDATION: Fentanyl 100 mcg IV; Versed 1 mg IV Moderate Sedation Time:  10 minutes The patient was continuously monitored during the procedure by the interventional radiology nurse under my direct supervision. FLUOROSCOPY TIME:  Fluoroscopy Time:  minutes  seconds ( mGy). COMPLICATIONS: None immediate. PROCEDURE: Informed written consent was obtained from the patient after a thorough discussion of the procedural risks, benefits and alternatives. All questions were addressed. Maximal Sterile Barrier Technique was utilized including caps, mask, sterile gowns, sterile gloves, sterile drape, hand hygiene and skin antiseptic. A timeout was performed prior to the initiation of the procedure. Under CT guidance, a(n) 11 gauge guide needle was advanced into the right iliac bone lesion. An 11 gauge core was obtained. Post biopsy images demonstrate no hemorrhage. Patient tolerated the procedure well without complication. Vital sign monitoring by nursing staff during the procedure will continue as patient is in the special procedures unit for post procedure observation. FINDINGS: The images document guide needle placement within the right iliac bone lesion. Post biopsy images demonstrate no hemorrhage. IMPRESSION: Successful CT-guided right iliac bone lesion core biopsy. Electronically Signed   By: Marybelle Killings M.D.   On: 04/24/2018 12:44     [place] Ekg Site Rite  Result Date: 05/09/2018 If Site Rite image not attached, placement could not be confirmed due to current cardiac rhythm.  Ir Paracentesis  Result Date: 04/19/2018 INDICATION: Recurrent ascites EXAM: ULTRASOUND-GUIDED PARACENTESIS COMPARISON:  Previous paracentesis MEDICATIONS: 10 cc 2% lidocaine. COMPLICATIONS: None immediate. TECHNIQUE: Informed written consent was obtained from the patient after a discussion of the risks, benefits and alternatives to treatment. A timeout was performed prior to the initiation of the procedure. Initial ultrasound scanning demonstrates a large amount of ascites within the left lower abdominal quadrant. The left lower abdomen was prepped and draped in the usual sterile fashion. 1% lidocaine with epinephrine was used for local anesthesia. Under direct ultrasound guidance, a 19 gauge, 7-cm, Yueh catheter was introduced. An ultrasound image was saved for documentation purposed. The paracentesis was performed. The catheter was removed and a dressing was applied. The patient tolerated the procedure well without immediate post procedural complication. FINDINGS: A total of approximately 4.2 liters of yellow fluid was removed. IMPRESSION: Successful ultrasound-guided paracentesis yielding 4.2 liters of peritoneal fluid. Read by Lavonia Drafts [facility] Electronically Signed   By: Jenny Reichmann  Watts M.D.   On: 04/19/2018 14:10    Micro Results      Recent Results (from the past 240 hour(s))  Culture, body fluid-bottle     Status: None   Collection Time: 05/03/18 12:39 PM  Result Value Ref Range Status   Specimen Description PERITONEAL  Final   Special Requests NONE  Final   Culture   Final    NO GROWTH 5 DAYS Performed at [facility], [address] [place], [place] [address]    Report Status 05/08/2018 FINAL  Final  Gram stain     Status: None   Collection Time: 05/03/18 12:39 PM  Result Value Ref Range Status   Specimen Description PERITONEAL   Final   Special Requests NONE  Final   Gram Stain   Final    RARE WBC PRESENT, PREDOMINANTLY MONONUCLEAR NO ORGANISMS SEEN Performed at [facility], [place] [address] [place], [place] [address]    Report Status 05/04/2018 FINAL  Final       Today   Subjective    Adrian Compton today is feeling better after paracentesis yesterday.  Wants to go home with comfort care.    No headache, minimal abdominal pain,no new weakness tingling or numbness, feels much better wants to go home today.   Objective   Blood pressure (!) 169/102, pulse 99, temperature 97.9 F (36.6 C), temperature source Oral, resp. rate 18, height 6\' 3"  (1.905 m), weight 94.1 kg (207 lb 7.3 oz), SpO2 98 %.   Intake/Output Summary (Last 24 hours) at 05/11/2018 0641 Last data filed at 05/10/2018 0900 Gross per 24 hour  Intake 240 ml  Output -  Net 240 ml    Exam Awake Alert, Oriented x 3, No new F.N deficits, Normal affect Day.AT,PERRAL Supple Neck,No JVD, No cervical lymphadenopathy appriciated.  Symmetrical Chest wall movement, Good air movement bilaterally, CTAB RRR,No Gallops,Rubs or new Murmurs, No Parasternal Heave +ve B.Sounds, Abd Soft, Non tender, No organomegaly appriciated, No rebound -guarding or rigidity. No Cyanosis, Clubbing or edema, No new Rash or bruise   Data Review   CBC w Diff:  Lab Results  Component Value Date   WBC 11.8 (H) 05/08/2018   HGB 13.3 05/08/2018   HCT 38.1 (L) 05/08/2018   PLT 42 (L) 05/08/2018   LYMPHOPCT 7 05/03/2018   MONOPCT 8 05/03/2018   EOSPCT 2 05/03/2018   BASOPCT 0 05/03/2018    CMP:  Lab Results  Component Value Date   NA 132 (L) 05/08/2018   K 4.1 05/08/2018   CL 98 (L) 05/08/2018   CO2 24 05/08/2018   BUN 25 (H) 05/08/2018   CREATININE 0.87 05/08/2018   PROT 7.3 05/03/2018   ALBUMIN 3.7 05/03/2018   BILITOT 2.2 (H) 05/03/2018   ALKPHOS 169 (H) 05/03/2018   AST 29 05/03/2018   ALT 16 (L) 05/03/2018  .   Total Time in preparing paper  work, data evaluation and todays exam - 2 minutes  Jani Gravel M.D on 05/11/2018 at 6:41 AM  [facility]   Office  [phone]

## 2018-05-11 NOTE — Progress Notes (Signed)
Dr Maudie Mercury has been in to see pt and states he will begin working on discharge note

## 2018-05-12 ENCOUNTER — Ambulatory Visit: Payer: Medicare Other

## 2018-05-15 ENCOUNTER — Ambulatory Visit: Payer: Medicare Other

## 2018-05-16 ENCOUNTER — Ambulatory Visit: Payer: Medicare Other

## 2018-05-17 ENCOUNTER — Ambulatory Visit: Payer: Medicare Other

## 2018-05-18 ENCOUNTER — Ambulatory Visit: Payer: Medicare Other

## 2018-05-19 ENCOUNTER — Encounter (HOSPITAL_COMMUNITY): Payer: Self-pay

## 2018-05-19 ENCOUNTER — Ambulatory Visit: Payer: Medicare Other

## 2018-05-19 ENCOUNTER — Emergency Department: Payer: Self-pay

## 2018-05-19 ENCOUNTER — Other Ambulatory Visit: Payer: Self-pay

## 2018-05-19 ENCOUNTER — Emergency Department (HOSPITAL_COMMUNITY)
Admission: EM | Admit: 2018-05-19 | Discharge: 2018-05-19 | Disposition: A | Discharge status: Home / Self Care | Attending: Emergency Medicine | Admitting: Emergency Medicine

## 2018-05-19 DIAGNOSIS — K7290 Hepatic failure, unspecified without coma: Secondary | ICD-10-CM | POA: Insufficient documentation

## 2018-05-19 DIAGNOSIS — Z87891 Personal history of nicotine dependence: Secondary | ICD-10-CM | POA: Insufficient documentation

## 2018-05-19 DIAGNOSIS — Z79899 Other long term (current) drug therapy: Secondary | ICD-10-CM | POA: Insufficient documentation

## 2018-05-19 DIAGNOSIS — Y658 Other specified misadventures during surgical and medical care: Secondary | ICD-10-CM | POA: Diagnosis not present

## 2018-05-19 DIAGNOSIS — R4182 Altered mental status, unspecified: Secondary | ICD-10-CM | POA: Diagnosis present

## 2018-05-19 DIAGNOSIS — C799 Secondary malignant neoplasm of unspecified site: Secondary | ICD-10-CM | POA: Insufficient documentation

## 2018-05-19 DIAGNOSIS — T829XXA Unspecified complication of cardiac and vascular prosthetic device, implant and graft, initial encounter: Secondary | ICD-10-CM | POA: Diagnosis not present

## 2018-05-19 DIAGNOSIS — K7210 Chronic hepatic failure without coma: Secondary | ICD-10-CM

## 2018-05-19 MED ORDER — HYDROMORPHONE HCL 1 MG/ML IJ SOLN
1.0000 mg | Freq: Once | INTRAMUSCULAR | Status: DC
  Filled 2018-05-19: qty 1

## 2018-05-19 MED ORDER — SODIUM CHLORIDE 0.9% FLUSH: 10.0000 mL | INTRAVENOUS | Status: DC | PRN

## 2018-05-19 MED ORDER — SODIUM CHLORIDE 0.9% FLUSH: 10.0000 mL | Freq: Two times a day (BID) | INTRAVENOUS | Status: DC

## 2018-05-19 NOTE — ED Notes (Signed)
Area around IV site swollen but IV is intact with good blood return noted.

## 2018-05-19 NOTE — ED Notes (Signed)
Per IR, states ED needs to call/order PICC line through IV team-stated that they have a case that will last hours and it wouldn't be till this afternoon that they would be able to get to patient-writer placed order for IV team consult

## 2018-05-19 NOTE — ED Notes (Signed)
PICC team redressed PICC site, thrombus pad applied to site. Patient tolerated procedure well.

## 2018-05-19 NOTE — ED Notes (Signed)
Upon discharge with PT[facility], PICC line site bleeding. Will contact PICC team to evaluate prior to discharge

## 2018-05-19 NOTE — Progress Notes (Signed)
Peripherally Inserted Central Catheter/Midline Placement  The IV Nurse has discussed with the patient and/or persons authorized to consent for the patient, the purpose of this procedure and the potential benefits and risks involved with this procedure.  The benefits include less needle sticks, lab draws from the catheter, and the patient may be discharged home with the catheter. Risks include, but not limited to, infection, bleeding, blood clot (thrombus formation), and puncture of an artery; nerve damage and irregular heartbeat and possibility to perform a PICC exchange if needed/ordered by physician.  Alternatives to this procedure were also discussed.  Bard Power PICC patient education guide, fact sheet on infection prevention and patient information card has been provided to patient /or left at bedside.    PICC/Midline Placement Documentation  PICC Single Lumen 39/67/28 PICC Right Basilic 38 cm 0 cm (Active)  Indication for Insertion or Continuance of Line Home intravenous therapies (PICC only) 05/19/2018 11:00 AM  Exposed Catheter (cm) 0 cm 05/19/2018 11:00 AM  Site Assessment Clean;Dry;Intact 05/19/2018 11:00 AM  Line Status Flushed;Saline locked;Blood return noted 05/19/2018 11:00 AM  Dressing Type Transparent;Securing device 05/19/2018 11:00 AM  Dressing Status Clean;Dry;Intact;Antimicrobial disc in place 05/19/2018 11:00 AM  Dressing Change Due 05/26/18 05/19/2018 11:00 AM       Valentina Shaggy Ramos 05/19/2018, 11:08 AM

## 2018-05-19 NOTE — ED Provider Notes (Signed)
[place] DEPT Provider Note   CSN: [id number] Arrival date & time: 05/19/18  0438     History   Chief Complaint Chief Complaint  Patient presents with  . PICC line pulled out    HPI Adrian Compton is a 62 y.o. male.  Patient with history metastatic cancer, hospice care, presents after inadvertently removing picc line this AM. Uses picc line for pain medication infusions. Otherwise current health is c/w his recent baseline. No fevers. No vomiting. No abrupt or acute increased in pain.   The history is provided by the patient.    Past Medical History:  Diagnosis Date  . Chronic low back pain   . Cirrhosis of liver ([place])   . Cirrhosis of liver due to hepatitis C   . Hepatitis C   . Presence of right artificial knee joint     Patient Active Problem List   Diagnosis Date Noted  . Palliative care encounter   . Malnutrition of moderate degree 05/05/2018  . Bone metastasis ([place]) 05/04/2018  . Back pain 05/03/2018  . Closed compression fracture of L3 lumbar vertebra, initial encounter ([place]) 05/03/2018  . Elevated blood pressure reading 05/03/2018  . Pathological compression fracture of spine ([place]) 05/03/2018  . Pulmonary nodule 04/13/2018  . Chronic low back pain 03/28/2018  . Mediastinal mass 03/28/2018  . Hepatitis C (treated) 03/28/2018  . Cirrhosis of liver due to hepatitis C 03/28/2018  . Presence of right artificial knee joint 03/28/2018  . Unintended weight loss 03/28/2018  . Pleuritic chest pain 03/28/2018  . Hoarseness 03/28/2018  . Altered mental status   . Hepatic encephalopathy ([place]) 05/24/2015  . Chronic pain 05/24/2015  . Ascites 01/24/2013  . Hypotension, iatrogenic 01/24/2013  . Leukocytosis 01/24/2013  . Dilutional hyponatremia due to cirrhosis 01/24/2013  . Umbilical hernia 70/26/3785  . Azotemia 01/24/2013  . Anemia in chronic illness 01/24/2013  . 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  Presumed SBP (spontaneous bacterial peritonitis) 01/24/2013  . Abdominal pain 01/23/2013  . Weakness 01/23/2013  . Hepatic cirrhosis due to chronic hepatitis C infection ([place]) 01/23/2013    Past Surgical History:  Procedure Laterality Date  . BACK SURGERY    . IR GENERIC HISTORICAL  03/02/2017   IR PARACENTESIS 03/02/2017 [name], PA [facility]-INTERV RAD  . IR PARACENTESIS  04/01/2017  . IR PARACENTESIS  05/10/2017  . IR PARACENTESIS  06/06/2017  . IR PARACENTESIS  07/18/2017  . IR PARACENTESIS  08/12/2017  . IR PARACENTESIS  09/22/2017  . IR PARACENTESIS  11/04/2017  . IR PARACENTESIS  01/05/2018  . IR PARACENTESIS  02/10/2018  . IR PARACENTESIS  02/23/2018  . IR PARACENTESIS  03/16/2018  . IR PARACENTESIS  03/29/2018  . IR PARACENTESIS  04/19/2018  . KNEE ARTHROPLASTY    . LAMINECTOMY          Home Medications    Prior to Admission medications   Medication Sig Start Date End Date Taking? Authorizing Provider  albuterol (PROVENTIL HFA;VENTOLIN HFA) 108 (90 BASE) MCG/ACT inhaler Inhale 2 puffs into the lungs every 6 (six) hours as needed for wheezing.    [provider]  Alum & Mag Hydroxide-Simeth (GI COCKTAIL) SUSP suspension Take 30 mLs by mouth 3 (three) times daily as needed for indigestion. Shake well. 05/11/18   Jani Gravel, MD  dexamethasone (DECADRON) 4 MG tablet Take 2 tablets (8 mg total) by mouth daily. 05/11/18   Jani Gravel, MD  docusate sodium (COLACE) 100  MG capsule Take 1 capsule (100 mg total) by mouth 2 (two) times daily. 05/11/18   Jani Gravel, MD  famotidine (PEPCID) 20 MG tablet Take 1 tablet (20 mg total) by mouth 2 (two) times daily. 05/11/18   Jani Gravel, MD  feeding supplement, ENSURE ENLIVE, (ENSURE ENLIVE) LIQD Take 237 mLs by mouth 2 (two) times daily between meals. 05/11/18   Jani Gravel, MD  HYDROmorphone (DILAUDID) 1 mg/mL injection 0.5mg /hr as basal rate. May bolus 0.5mg  every 15 minutes as needed for maximum 2mg  in 1 hour. 05/10/18   [name], DO    Lidocaine 4 % PTCH Apply 1 patch topically daily as needed (pain).    [provider]  LORazepam (ATIVAN) 0.5 MG tablet Take 1 tablet (0.5 mg total) by mouth every 6 (six) hours as needed for anxiety or sleep. 05/11/18   Jani Gravel, MD  ondansetron (ZOFRAN) 4 MG tablet Take 1 tablet (4 mg total) by mouth every 6 (six) hours. Patient taking differently: Take 4 mg by mouth every 6 (six) hours as needed.  06/06/14   [name], [name], PA-C  polyethylene glycol (MIRALAX / GLYCOLAX) packet Take 17 g by mouth daily. 05/11/18   Jani Gravel, MD  rifaximin (XIFAXAN) 200 MG tablet Take 1 tablet (200 mg total) by mouth 2 (two) times daily. 05/11/18   Jani Gravel, MD  senna-docusate (SENOKOT-S) 8.6-50 MG tablet Take 1 tablet by mouth at bedtime. 05/11/18   Jani Gravel, MD  sertraline (ZOLOFT) 100 MG tablet Take 100 mg by mouth daily.    [provider]  sodium chloride flush (NS) 0.9 % SOLN 10-40 mLs by Intracatheter route as needed (flush). 05/11/18   Jani Gravel, MD    Family History Family History  Problem Relation Age of Onset  . Breast cancer Mother   . Diabetes type II Mother   . Cancer Father   . Multiple sclerosis Father   . Skin cancer Brother     Social History Social History   Tobacco Use  . Smoking status: Former Smoker    Last attempt to quit: 04/12/2005    Years since quitting: 13.1  . Smokeless tobacco: Former Systems developer    Types: Snuff    Quit date: 04/12/2005  Substance Use Topics  . Alcohol use: No  . Drug use: No     Allergies   Nuvigil [armodafinil]; Codeine; and Lactose intolerance (gi)   Review of Systems Review of Systems  Constitutional: Negative for fever.  Respiratory: Negative for shortness of breath.   Gastrointestinal: Negative for vomiting.     Physical Exam Updated Vital Signs BP (!) 158/106 (BP Location: Left Arm)   Pulse (!) 115   Temp 98.9 F (37.2 C) (Oral)   Resp 20   SpO2 98%   Physical Exam  Constitutional: He appears well-developed and  well-nourished. No distress.  HENT:  Head: Atraumatic.  Eyes: Conjunctivae are normal.  Neck: Neck supple. No tracheal deviation present.  Cardiovascular: Regular rhythm and intact distal pulses.  Pulmonary/Chest: Effort normal and breath sounds normal. No accessory muscle usage. No respiratory distress.  Abdominal: There is no tenderness.  Musculoskeletal: He exhibits no edema.  Neurological: He is alert.  Skin: Skin is warm and dry. He is not diaphoretic.  Psychiatric: He has a normal mood and affect.  Nursing note and vitals reviewed.    ED Treatments / Results  Labs (all labs ordered are listed, but only abnormal results are displayed) Labs Reviewed - No data to display  EKG None  Radiology No results found.  Procedures Procedures (including critical care time)  Medications Ordered in ED Medications - No data to display   Initial Impression / Assessment and Plan / ED Course  I have reviewed the triage vital signs and the nursing notes.  Pertinent labs & imaging results that were available during my care of the patient were reviewed by me and considered in my medical decision making (see chart for details).  Iv team consult placed for picc line.   Reviewed nursing notes and prior charts for additional history.   Iv ns.   Dilaudid 1 mg iv.  Po fluids.    Final Clinical Impressions(s) / ED Diagnoses   Final diagnoses:  Complication associated with peripherally inserted central catheter, initial encounter  End stage liver disease [facility])    ED Discharge Orders    None       Lajean Saver, MD 05/19/18 0900

## 2018-05-19 NOTE — ED Provider Notes (Signed)
[name] DEPT Provider Note: Adrian Spurling, MD, FACEP  CSN: [id number] MRN: [id number] ARRIVAL: 05/19/18 at [place]: [place]  PICC line pulled out  Level 5 caveat: Altered mental status HISTORY OF PRESENT ILLNESS  05/19/18 5:17 AM Adrian Compton is a 62 y.o. 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  He was sent here from home after pulling out his PICC line.  The patient is nonverbal and unable to give a history.   Past Medical History:  Diagnosis Date  . Chronic low back pain   . Cirrhosis of liver ([place])   . Cirrhosis of liver due to hepatitis C   . Hepatitis C   . Presence of right artificial knee joint     Past Surgical History:  Procedure Laterality Date  . BACK SURGERY    . IR GENERIC HISTORICAL  03/02/2017   IR PARACENTESIS 03/02/2017 [name], PA [facility]-INTERV RAD  . IR PARACENTESIS  04/01/2017  . IR PARACENTESIS  05/10/2017  . IR PARACENTESIS  06/06/2017  . IR PARACENTESIS  07/18/2017  . IR PARACENTESIS  08/12/2017  . IR PARACENTESIS  09/22/2017  . IR PARACENTESIS  11/04/2017  . IR PARACENTESIS  01/05/2018  . IR PARACENTESIS  02/10/2018  . IR PARACENTESIS  02/23/2018  . IR PARACENTESIS  03/16/2018  . IR PARACENTESIS  03/29/2018  . IR PARACENTESIS  04/19/2018  . KNEE ARTHROPLASTY    . LAMINECTOMY      Family History  Problem Relation Age of Onset  . Breast cancer Mother   . Diabetes type II Mother   . Cancer Father   . Multiple sclerosis Father   . Skin cancer Brother     Social History   Tobacco Use  . Smoking status: Former Smoker    Last attempt to quit: 04/12/2005    Years since quitting: 13.1  . Smokeless tobacco: Former Systems developer    Types: Snuff    Quit date: 04/12/2005  Substance Use Topics  . Alcohol use: No  . Drug use: No    Prior to Admission medications   Medication Sig Start Date End Date Taking? Authorizing Provider  albuterol  (PROVENTIL HFA;VENTOLIN HFA) 108 (90 BASE) MCG/ACT inhaler Inhale 2 puffs into the lungs every 6 (six) hours as needed for wheezing.    [provider]  Alum & Mag Hydroxide-Simeth (GI COCKTAIL) SUSP suspension Take 30 mLs by mouth 3 (three) times daily as needed for indigestion. Shake well. 05/11/18   Jani Gravel, MD  dexamethasone (DECADRON) 4 MG tablet Take 2 tablets (8 mg total) by mouth daily. 05/11/18   Jani Gravel, MD  docusate sodium (COLACE) 100 MG capsule Take 1 capsule (100 mg total) by mouth 2 (two) times daily. 05/11/18   Jani Gravel, MD  famotidine (PEPCID) 20 MG tablet Take 1 tablet (20 mg total) by mouth 2 (two) times daily. 05/11/18   Jani Gravel, MD  feeding supplement, ENSURE ENLIVE, (ENSURE ENLIVE) LIQD Take 237 mLs by mouth 2 (two) times daily between meals. 05/11/18   Jani Gravel, MD  HYDROmorphone (DILAUDID) 1 mg/mL injection 0.5mg /hr as basal rate. May bolus 0.5mg  every 15 minutes as needed for maximum 2mg  in 1 hour. 05/10/18   [name], DO  Lidocaine 4 % PTCH Apply 1 patch topically daily as needed (pain).    [provider]  LORazepam (ATIVAN) 0.5 MG tablet Take 1 tablet (0.5 mg total)  by mouth every 6 (six) hours as needed for anxiety or sleep. 05/11/18   Jani Gravel, MD  ondansetron (ZOFRAN) 4 MG tablet Take 1 tablet (4 mg total) by mouth every 6 (six) hours. Patient taking differently: Take 4 mg by mouth every 6 (six) hours as needed.  06/06/14   [name], [name], PA-C  polyethylene glycol (MIRALAX / GLYCOLAX) packet Take 17 g by mouth daily. 05/11/18   Jani Gravel, MD  rifaximin (XIFAXAN) 200 MG tablet Take 1 tablet (200 mg total) by mouth 2 (two) times daily. 05/11/18   Jani Gravel, MD  senna-docusate (SENOKOT-S) 8.6-50 MG tablet Take 1 tablet by mouth at bedtime. 05/11/18   Jani Gravel, MD  sertraline (ZOLOFT) 100 MG tablet Take 100 mg by mouth daily.    [provider]  sodium chloride flush (NS) 0.9 % SOLN 10-40 mLs by Intracatheter route as needed (flush). 05/11/18    Jani Gravel, MD    Allergies Nuvigil [armodafinil]; Codeine; and Lactose intolerance (gi)  REVIEW OF SYSTEMS    PHYSICAL EXAMINATION  Initial Vital Signs Blood pressure (!) 158/106, pulse (!) 115, temperature 98.9 F (37.2 C), temperature source Oral, resp. rate 20, SpO2 98 %.  Examination General: Well-developed, well-nourished male in no acute distress; appears older than age of record HENT: normocephalic; atraumatic Eyes: pupils equal, round and reactive to light; extraocular muscles intact Neck: supple Heart: regular rate and rhythm Lungs: clear to auscultation bilaterally Abdomen: soft; ascites; right upper quadrant tenderness; hepatomegaly; bowel sounds present Extremities: No deformity; full range of motion; pulses normal Neurologic: Awake, alert; minimally verbal; motor function intact in all extremities and symmetric; no facial droop Skin: Warm and dry Psychiatric: Flat affect   RESULTS  Summary of this visit's results, reviewed by myself:   EKG Interpretation  Date/Time:    Ventricular Rate:    PR Interval:    QRS Duration:   QT Interval:    QTC Calculation:   R Axis:     Text Interpretation:        Laboratory Studies: No results found for this or any previous visit (from the past 24 hour(s)). Imaging Studies: No results found.  ED COURSE and MDM  Nursing notes and initial vitals signs, including pulse oximetry, reviewed.  Vitals:   05/19/18 0505  BP: (!) 158/106  Pulse: (!) 115  Resp: 20  Temp: 98.9 F (37.2 C)  TempSrc: Oral  SpO2: 98%   5:27 AM Patient's nurse obtained peripheral IV access and reattached his infusion pump.  We plan to have a PICC line placed this morning and discharge back home.  PROCEDURES    ED DIAGNOSES     ICD-10-CM   1. Complication associated with peripherally inserted central catheter, initial encounter T82.9XXA        Sem Mccaughey, Jenny Reichmann, MD 05/19/18 [phone]

## 2018-05-19 NOTE — ED Notes (Signed)
Patient verbalized discharge instructions. [facility] here to transport patient. Patient out of ED via PTAR in no distress. PICC line with no bleeding noted upon discharge

## 2018-05-19 NOTE — ED Notes (Signed)
Per IV team-they are currently at [facility] and will be here in a hour or so

## 2018-05-19 NOTE — ED Notes (Signed)
IV team at bedside placing PICC line

## 2018-05-19 NOTE — ED Notes (Signed)
Adrian Compton, [phone]. Contact number.

## 2018-05-19 NOTE — ED Triage Notes (Signed)
Patient arrives by [facility] with complaints of PICC line accidentally pulled out-patient is on Dilaudid-cancer patient with liver failure.

## 2018-05-19 NOTE — Discharge Instructions (Signed)
It was our pleasure to provide your ER care today - we hope that you feel better.  Follow up with your doctor .  Return to ER if worse, other medical emergency.

## 2018-05-22 ENCOUNTER — Ambulatory Visit: Payer: Medicare Other

## 2018-05-23 ENCOUNTER — Ambulatory Visit: Payer: Medicare Other | Admitting: Pulmonary Disease

## 2018-05-23 ENCOUNTER — Ambulatory Visit: Payer: Medicare Other

## 2018-05-23 NOTE — Progress Notes (Signed)
  Radiation Oncology         [phone] ________________________________  Name: Adrian Compton MRN: [id number]  Date: 05/05/2018  DOB: 01/20/1956  SIMULATION AND TREATMENT PLANNING NOTE  DIAGNOSIS:     ICD-10-CM   1. Bone metastasis ([place]) C79.51      Site:   1.  T-spine 2.  L-spine   NARRATIVE:  The patient was brought to the [facility].  Identity was confirmed.  All relevant records and images related to the planned course of therapy were reviewed.   Written consent to proceed with treatment was confirmed which was freely given after reviewing the details related to the planned course of therapy had been reviewed with the patient.  Then, the patient was set-up in a stable reproducible  supine position for radiation therapy.  CT images were obtained.  Surface markings were placed.    Medically necessary complex treatment device(s) for immobilization: Customized Vac-Lok bag.   The CT images were loaded into the planning software.  Then the target and avoidance structures were contoured.  Treatment planning then occurred.  The radiation prescription was entered and confirmed.  A total of 5 complex treatment devices were fabricated which relate to the designed radiation treatment fields: A 3 field approach for the thoracic spine and a 2 field approach for the lumbar spine  . Each of these customized fields/ complex treatment devices will be used on a daily basis during the radiation course. I have requested : 3D Simulation  I have requested a DVH of the following structures: Gross tumor volume, spinal cord, left kidney, right kidney.   PLAN:  The patient will receive 30 Gy in 10 fractions to both of the 2 separate target areas.  ________________________________   Jodelle Gross, MD, PhD

## 2018-06-05 DEATH — deceased

## 2018-06-26 IMAGING — CT CT BIOPSY
1 of 2 series · 15 of 32 positions shown, 19 images · non-contrast
Comparison: none

INDICATION: Hypermetabolic bone lesions including the right iliac bone

[Series 2: i-spiral 5.0 b40f · axial · 0.83mm/px · z∈[+969,+1140]mm · 15 of 55 slices shown, 19 images]
[im 3/55  soft-tissue]
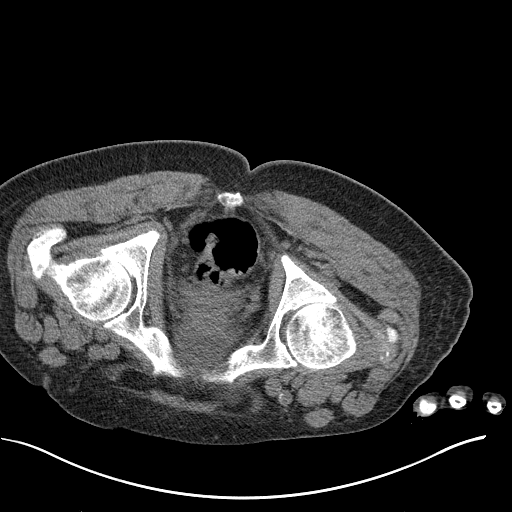
[im 3/55  bone]
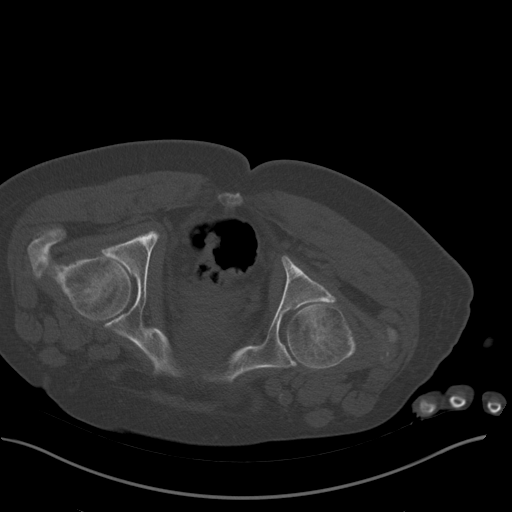
[im 8/55  soft-tissue]
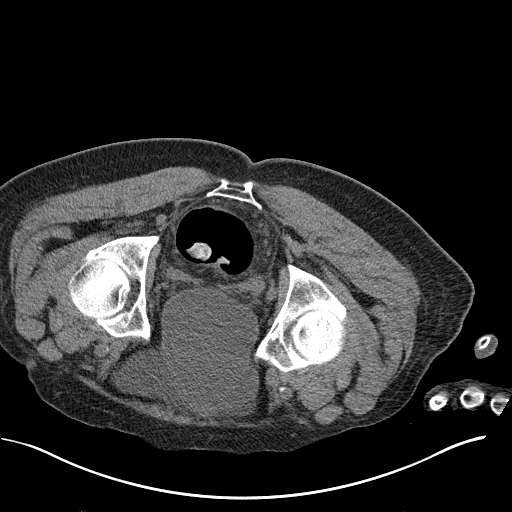
[im 13/55  soft-tissue]
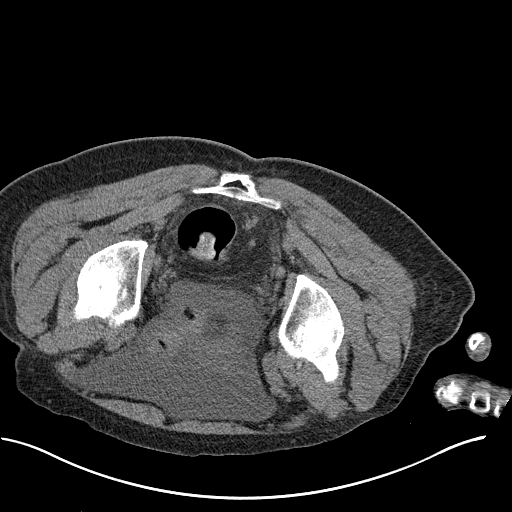
[im 15/55  soft-tissue]
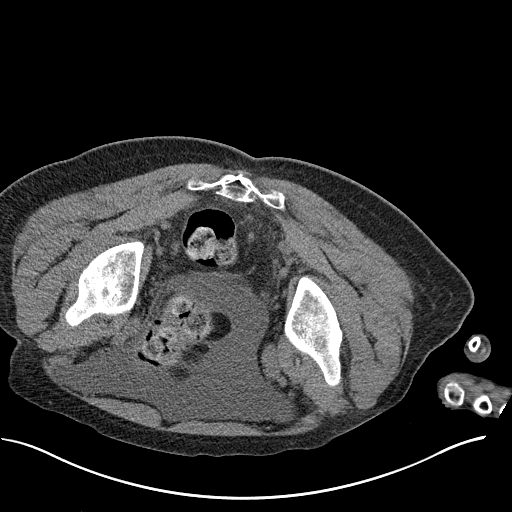
[im 20/55  soft-tissue]
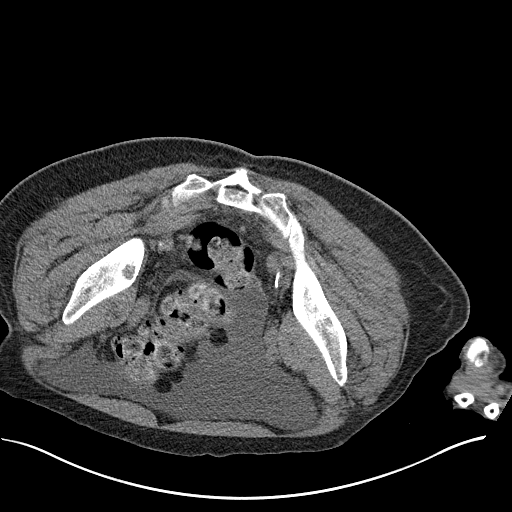
[im 23/55  soft-tissue]
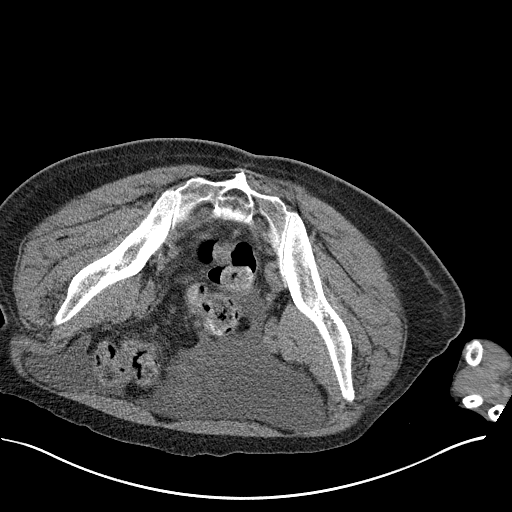
[im 28/55  soft-tissue]
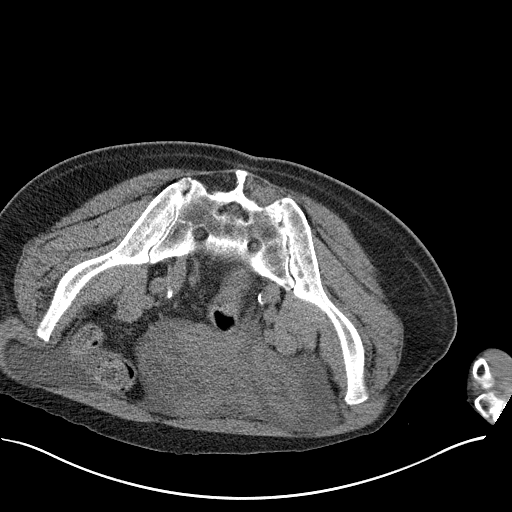
[im 32/55  soft-tissue]
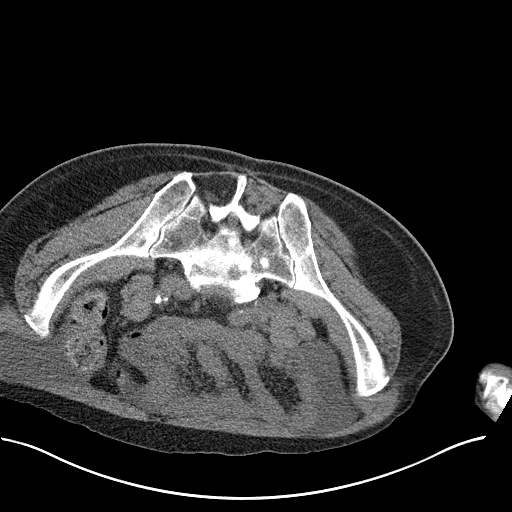
[im 35/55  soft-tissue]
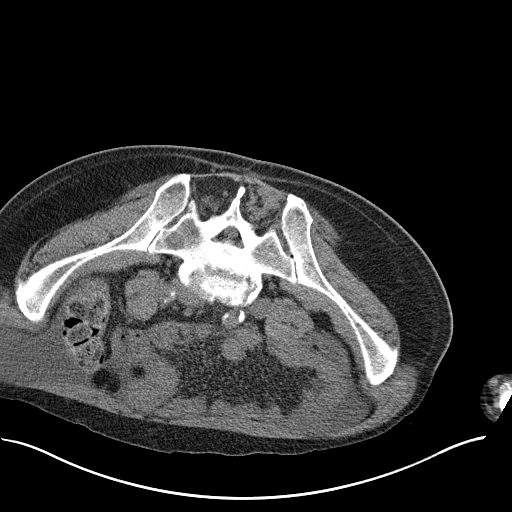
[im 35/55  bone]
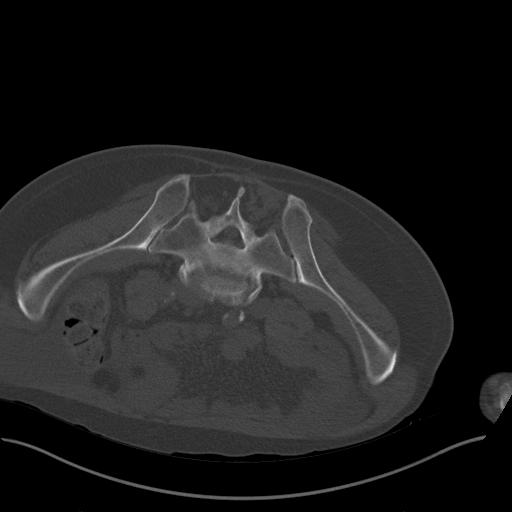
[im 40/55  soft-tissue]
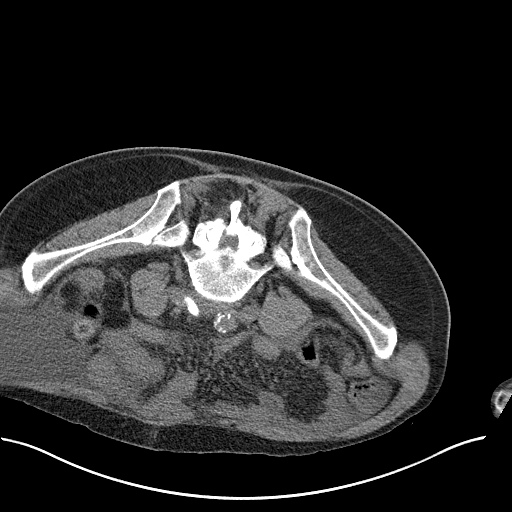
[im 42/55  soft-tissue]
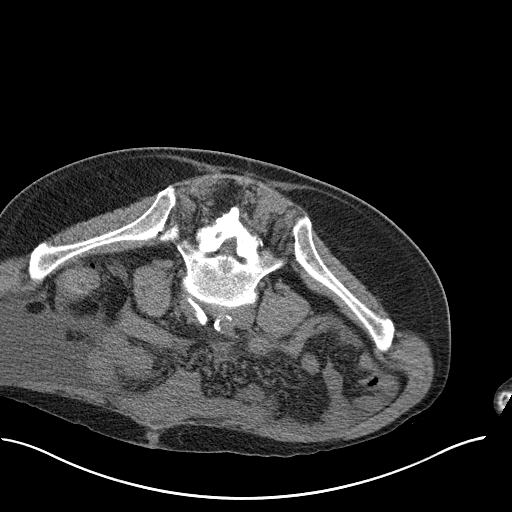
[im 45/55  lung]
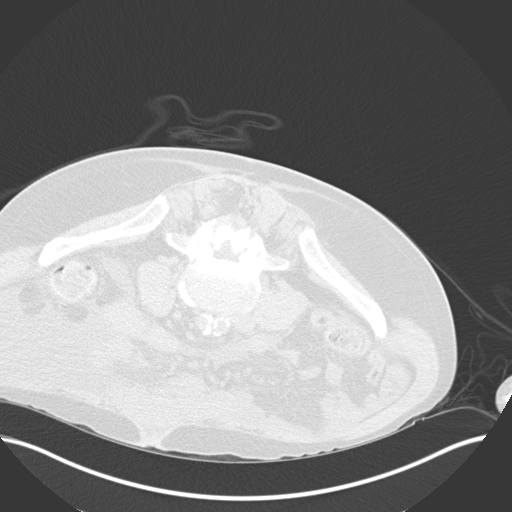
[im 47/55  soft-tissue]
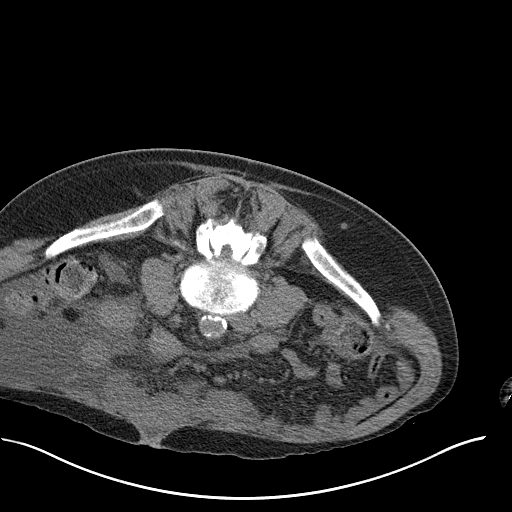
[im 47/55  lung]
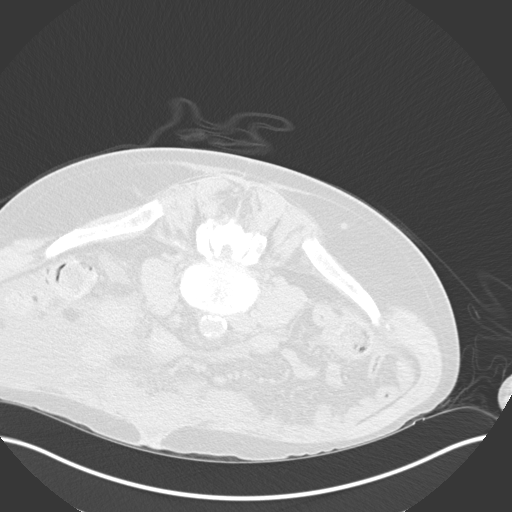
[im 50/55  lung]
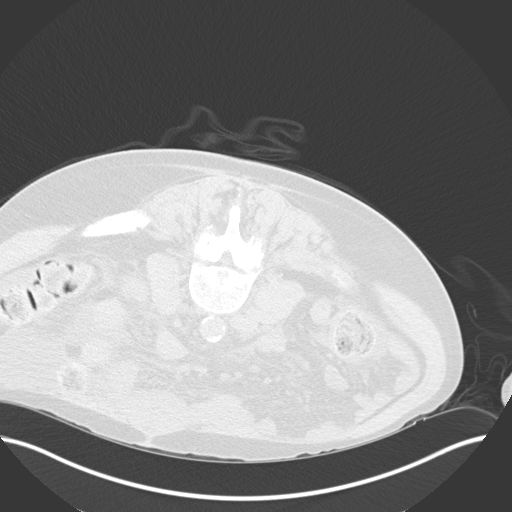
[im 52/55  soft-tissue]
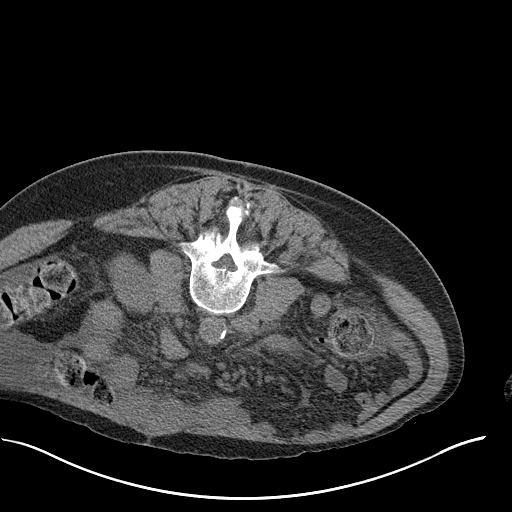
[im 52/55  lung]
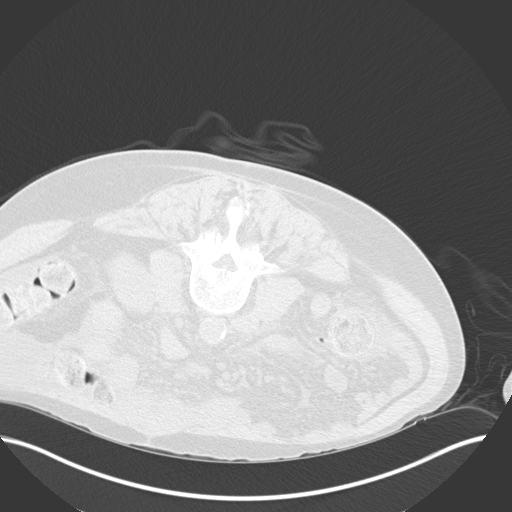

[15 of 32 positions shown; findings below may reference images not displayed]

EXAM:
CT BIOPSY

MEDICATIONS:
None.

ANESTHESIA/SEDATION:
Fentanyl 100 mcg IV; Versed 1 mg IV

Moderate Sedation Time:  10 minutes

The patient was continuously monitored during the procedure by the
interventional radiology nurse under my direct supervision.

FLUOROSCOPY TIME:  Fluoroscopy Time:  minutes  seconds ( mGy).

COMPLICATIONS:
None immediate.

PROCEDURE:
Informed written consent was obtained from the patient after a
thorough discussion of the procedural risks, benefits and
alternatives. All questions were addressed. Maximal Sterile Barrier
Technique was utilized including caps, mask, sterile gowns, sterile
gloves, sterile drape, hand hygiene and skin antiseptic. A timeout
was performed prior to the initiation of the procedure.

Under CT guidance, a(n) 11 gauge guide needle was advanced into the
right iliac bone lesion. An 11 gauge core was obtained. Post biopsy
images demonstrate no hemorrhage.

Patient tolerated the procedure well without complication. Vital
sign monitoring by nursing staff during the procedure will continue
as patient is in the special procedures unit for post procedure
observation.
FINDINGS: The images document guide needle placement within the right iliac
bone lesion. Post biopsy images demonstrate no hemorrhage.
IMPRESSION: Successful CT-guided right iliac bone lesion core biopsy.

## 2018-07-05 IMAGING — US US PARACENTESIS
1 series · 5 of 5 positions shown · non-contrast
Comparison: none

INDICATION: History of cirrhosis and hepatitis C. Abdominal distention secondary
to recurrent ascites. Request for diagnostic and therapeutic
PARACENTESIS.

[Series 1: us paracentesis · 5 of 5 slices shown]
[im 1/5]
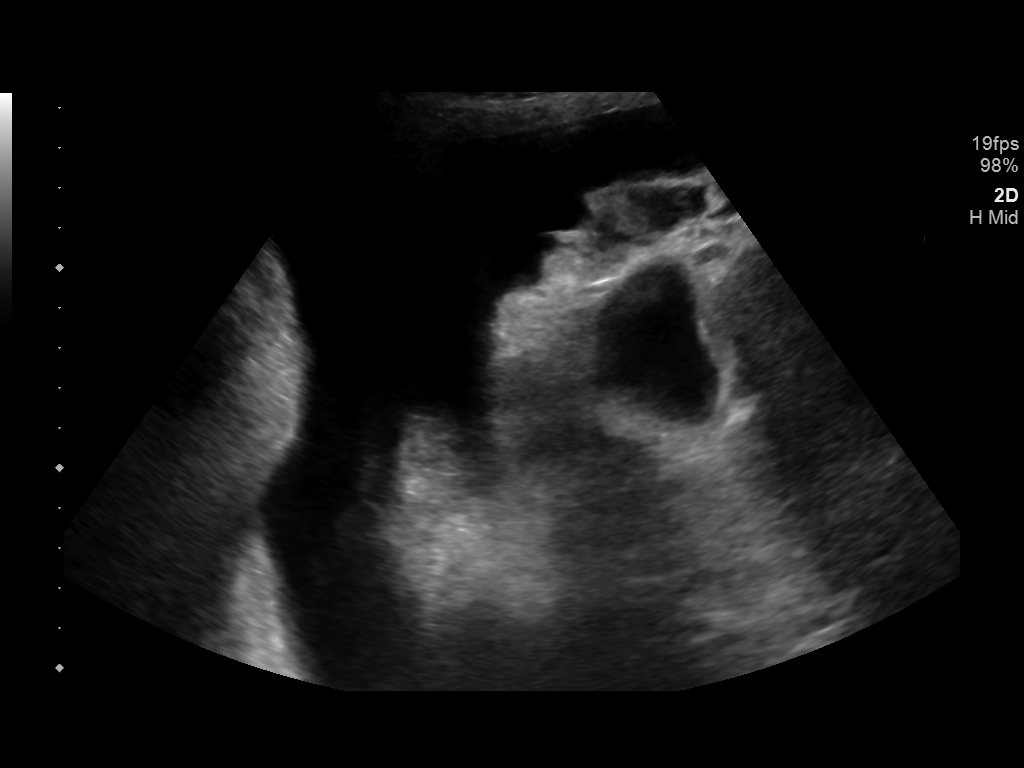
[im 2/5]
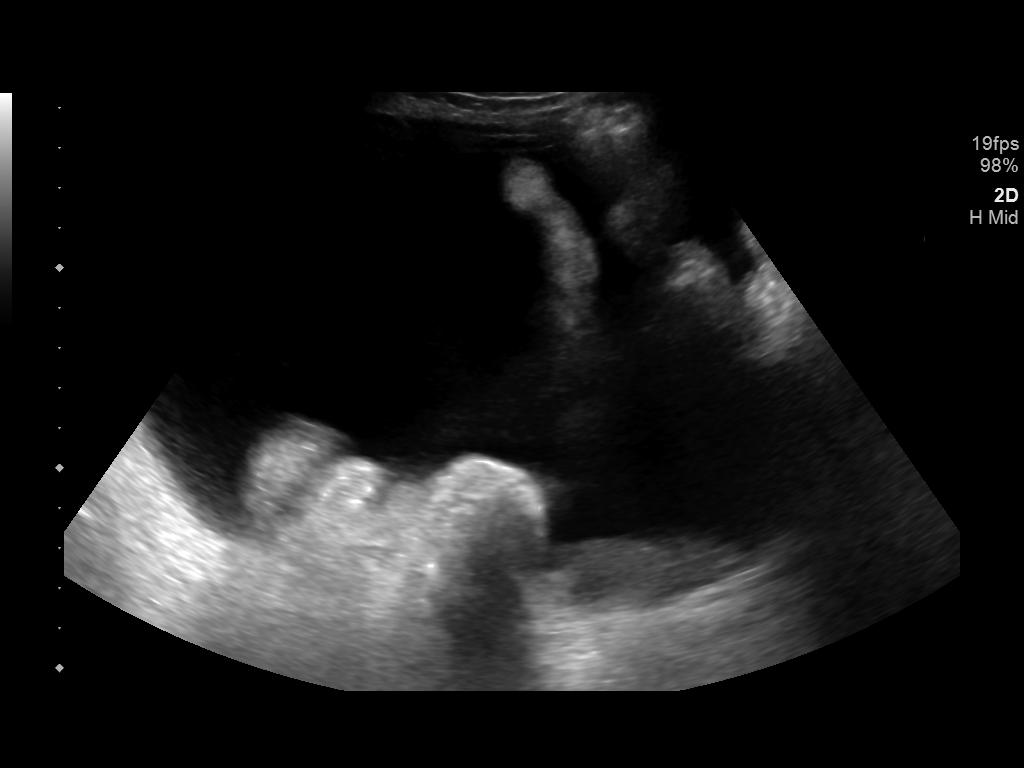
[im 3/5]
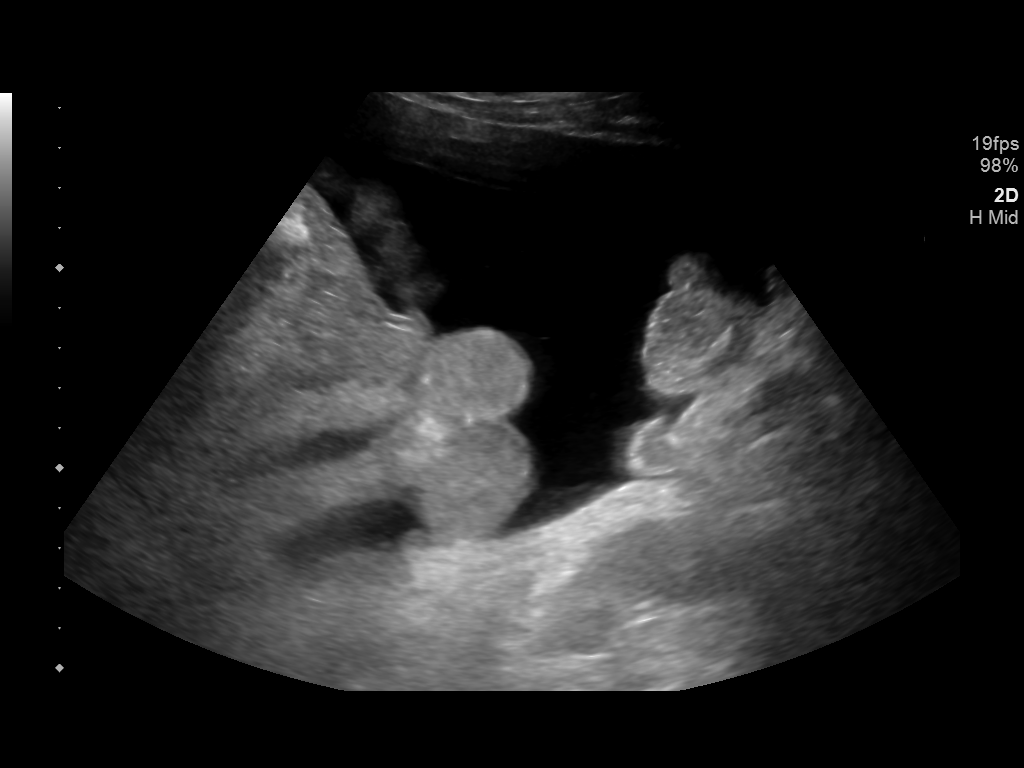
[im 4/5]
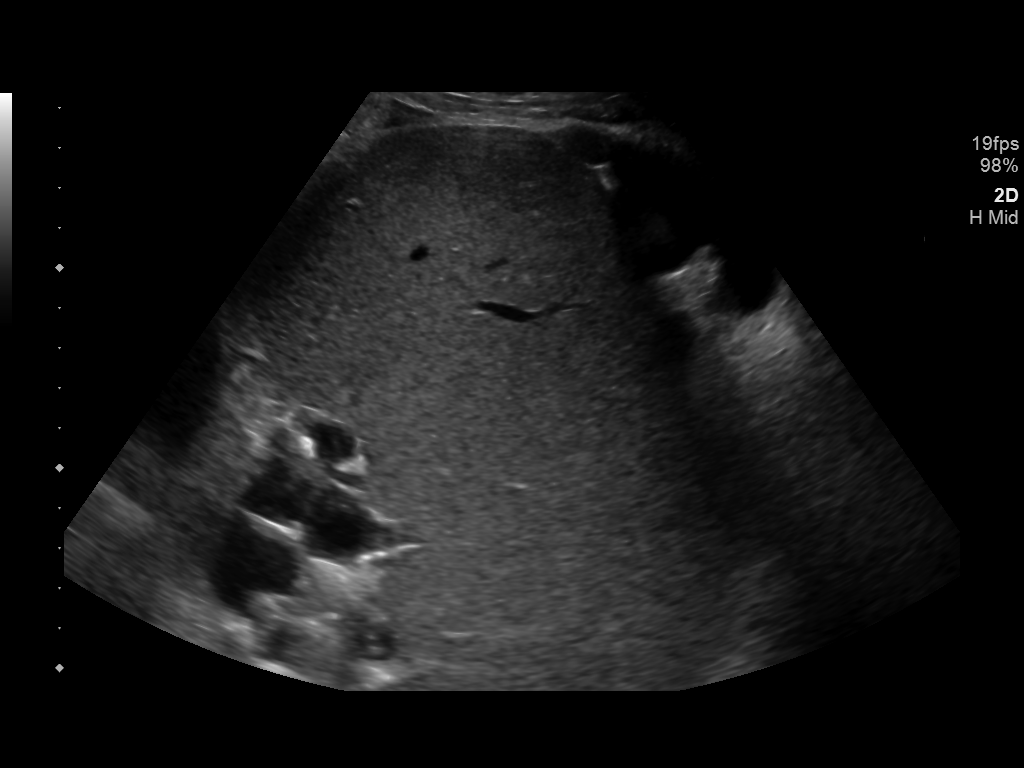
[im 5/5]
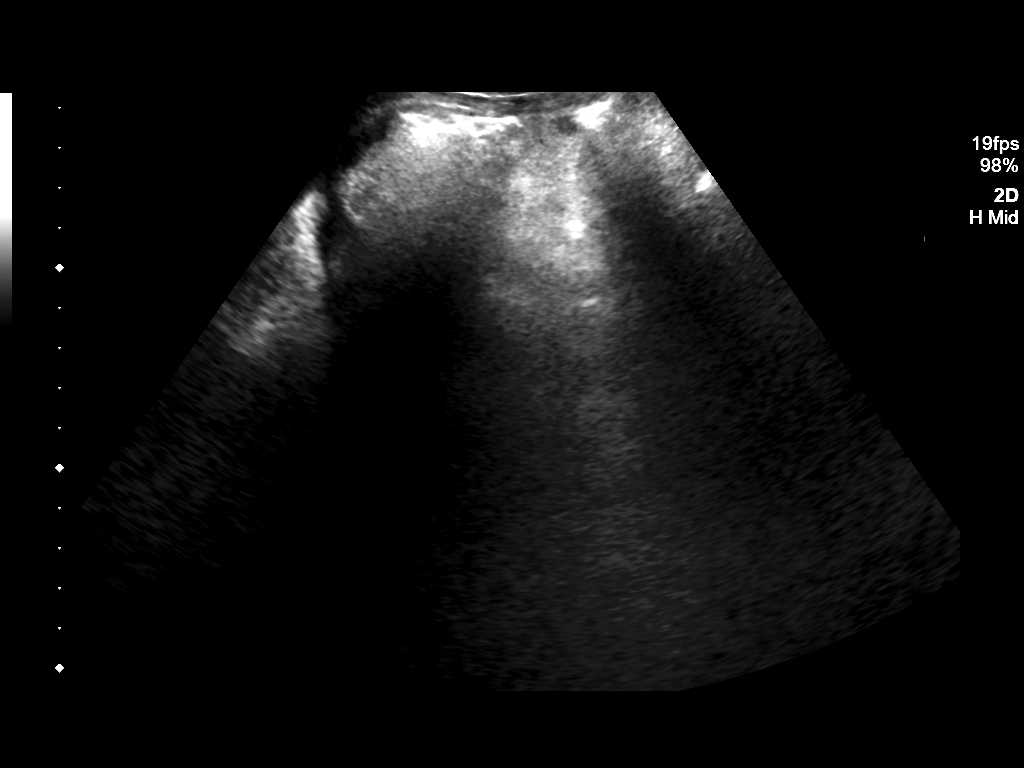

[5 of 5 positions shown; findings below may reference images not displayed]

EXAM:
ULTRASOUND GUIDED RIGHT LOWER QUADRANT PARACENTESIS

MEDICATIONS:
None.

COMPLICATIONS:
None immediate.

PROCEDURE:
Informed written consent was obtained from the patient after a
discussion of the risks, benefits and alternatives to treatment. A
timeout was performed prior to the initiation of the procedure.

Initial ultrasound scanning demonstrates a large amount of ascites
within the right lower abdominal quadrant. The right lower abdomen
was prepped and draped in the usual sterile fashion. 1% lidocaine
with epinephrine was used for local anesthesia.

Following this, a 19 gauge, 7-cm, Yueh catheter was introduced. An
ultrasound image was saved for documentation purposes. The
paracentesis was performed. The catheter was removed and a dressing
was applied. The patient tolerated the procedure well without
immediate post procedural complication.
FINDINGS: A total of approximately 5.1 L of hazy, dark yellow fluid was
removed. Samples were sent to the laboratory as requested by the
clinical team.
IMPRESSION: Successful ultrasound-guided paracentesis yielding 5.1 liters of
peritoneal fluid.

## 2018-07-12 IMAGING — US US PARACENTESIS
1 series · 5 of 5 positions shown · non-contrast
Comparison: none

INDICATION: Hepatitis-C, cirrhosis, metastatic adenocarcinoma, prior TIPS,
recurrent ascites. Request made for therapeutic paracentesis.

[Series 1: us paracentesis · 5 of 5 slices shown]
[im 1/5]
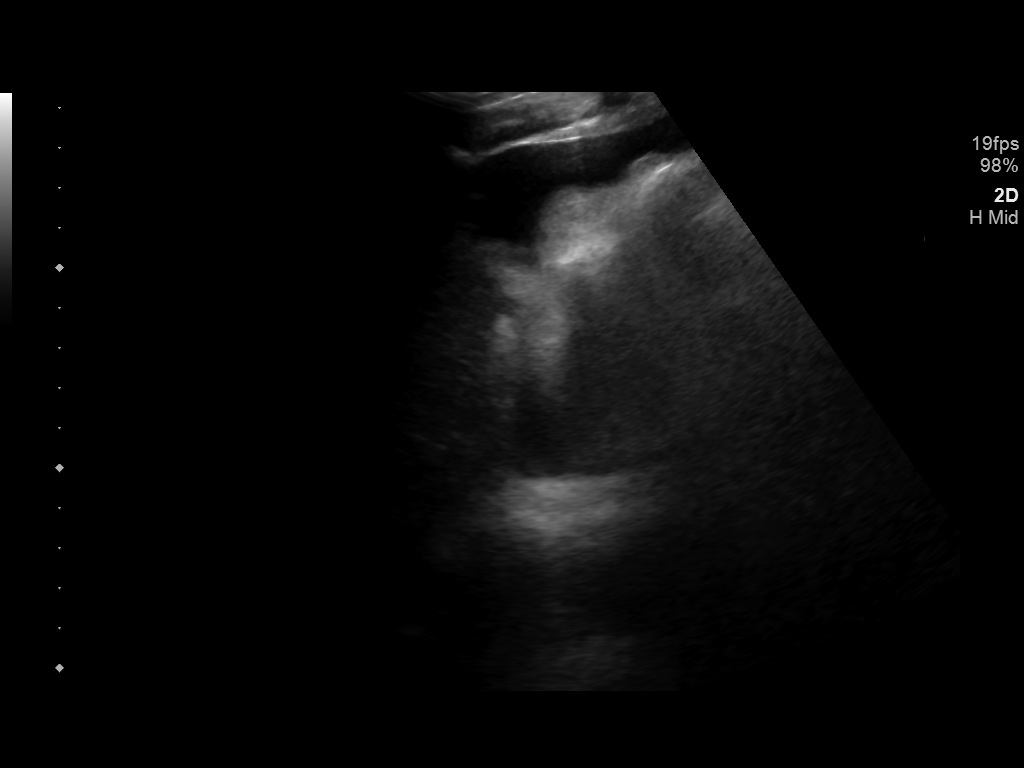
[im 2/5]
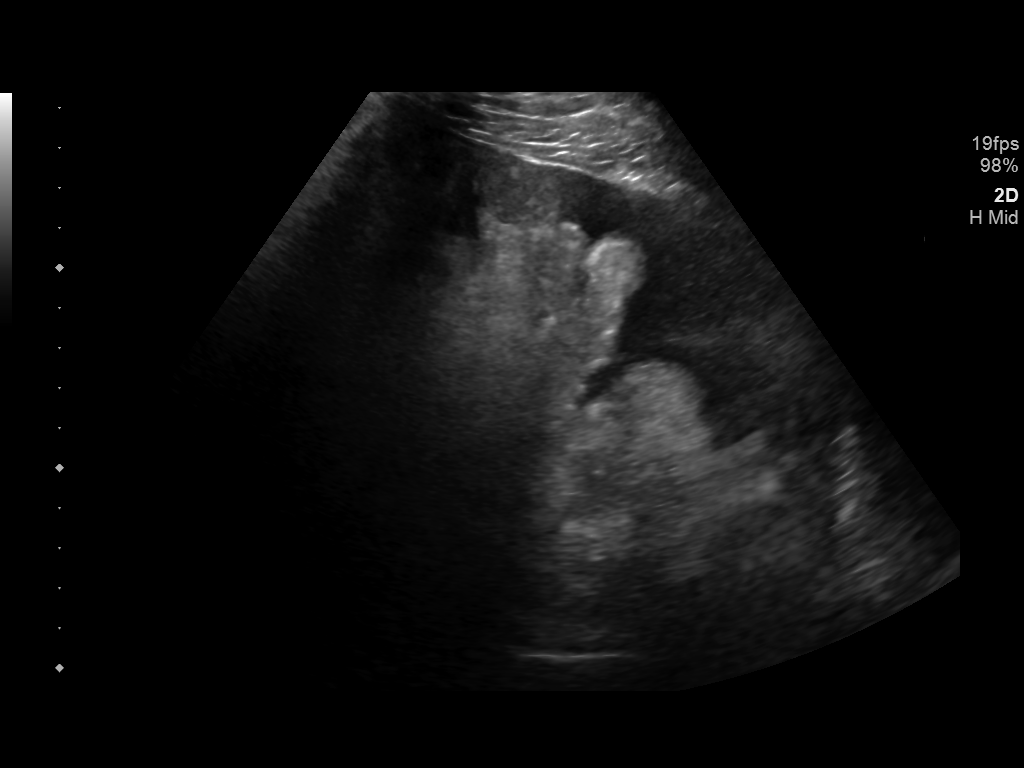
[im 3/5]
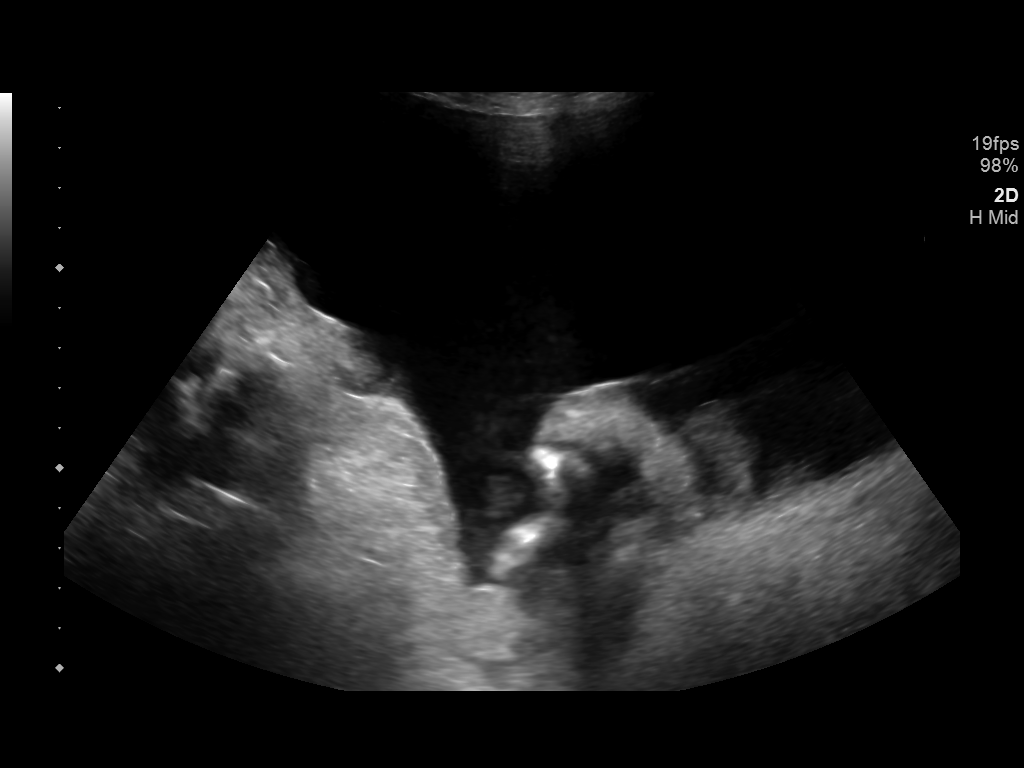
[im 4/5]
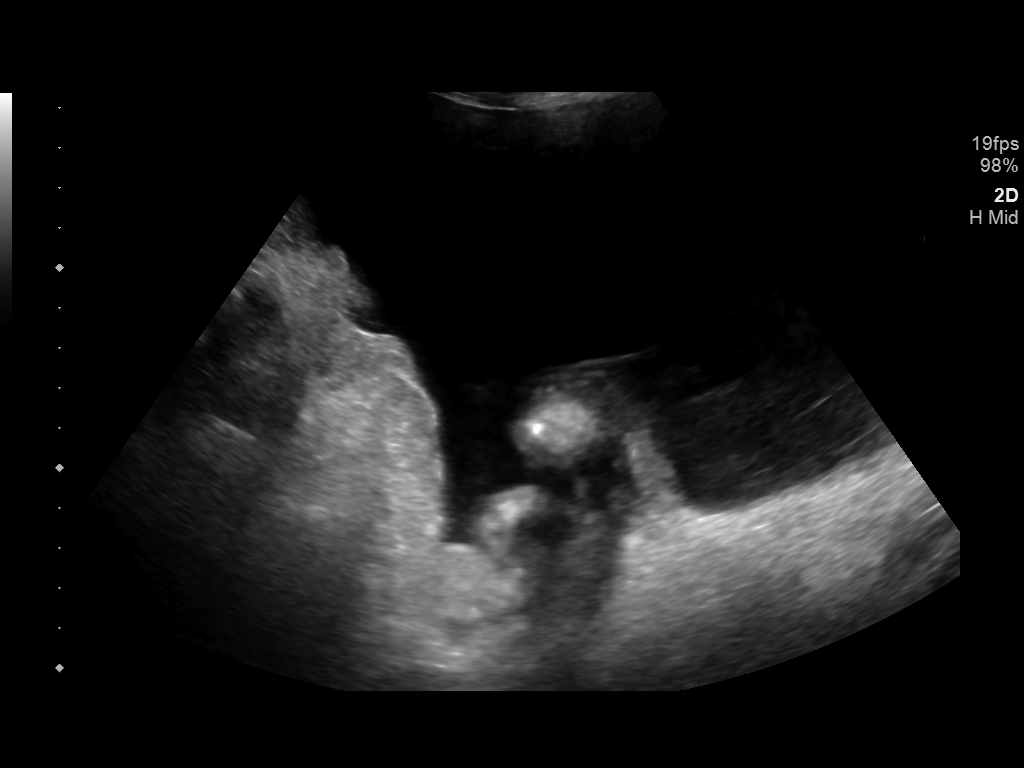
[im 5/5]
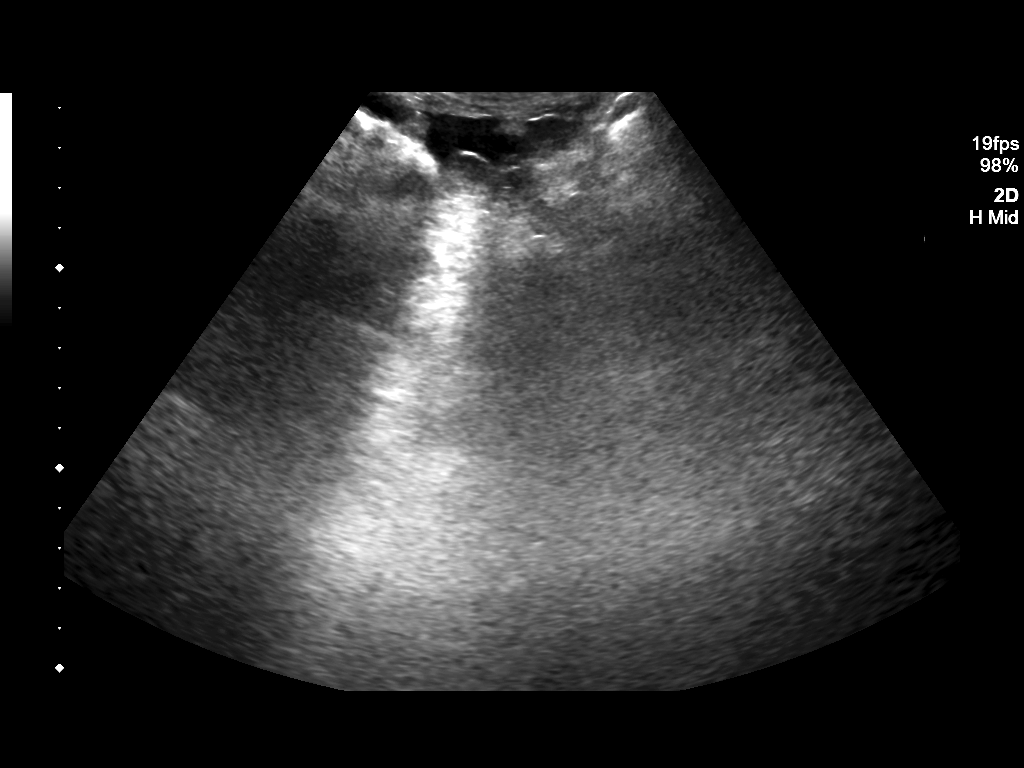

[5 of 5 positions shown; findings below may reference images not displayed]

EXAM:
ULTRASOUND GUIDED THERAPEUTIC PARACENTESIS

MEDICATIONS:
None

COMPLICATIONS:
None immediate.

PROCEDURE:
Informed written consent was obtained from the patient after a
discussion of the risks, benefits and alternatives to treatment. A
timeout was performed prior to the initiation of the procedure.

Initial ultrasound scanning demonstrates a large amount of ascites
within the left lower abdominal quadrant. The left lower abdomen was
prepped and draped in the usual sterile fashion. 1% lidocaine was
used for local anesthesia.

Following this, a 19 gauge, 7-cm, Yueh catheter was introduced. An
ultrasound image was saved for documentation purposes. The
paracentesis was performed. The catheter was removed and a dressing
was applied. The patient tolerated the procedure well without
immediate post procedural complication.
FINDINGS: A total of approximately 4.6 liters of hazy, yellow fluid was
removed.
IMPRESSION: Successful ultrasound-guided therapeutic paracentesis yielding
liters of peritoneal fluid.
